# Patient Record
Sex: Female | Born: 1990 | Race: White | Hispanic: No | Marital: Single | Smoking: Never smoker
Health system: Southern US, Community
[De-identification: ages and names within clinical notes are randomized; demographics above are authoritative.]

## PROBLEM LIST (undated history)

## (undated) ENCOUNTER — Emergency Department (HOSPITAL_BASED_OUTPATIENT_CLINIC_OR_DEPARTMENT_OTHER): Admission: EM | Payer: BC Managed Care – PPO | Source: Home / Self Care

## (undated) DIAGNOSIS — K8689 Other specified diseases of pancreas: Secondary | ICD-10-CM

## (undated) DIAGNOSIS — G44009 Cluster headache syndrome, unspecified, not intractable: Secondary | ICD-10-CM

## (undated) DIAGNOSIS — K200 Eosinophilic esophagitis: Secondary | ICD-10-CM

## (undated) DIAGNOSIS — J45909 Unspecified asthma, uncomplicated: Secondary | ICD-10-CM

## (undated) DIAGNOSIS — G43909 Migraine, unspecified, not intractable, without status migrainosus: Secondary | ICD-10-CM

## (undated) DIAGNOSIS — K589 Irritable bowel syndrome without diarrhea: Secondary | ICD-10-CM

## (undated) DIAGNOSIS — R42 Dizziness and giddiness: Secondary | ICD-10-CM

## (undated) DIAGNOSIS — K2970 Gastritis, unspecified, without bleeding: Secondary | ICD-10-CM

## (undated) DIAGNOSIS — A64 Unspecified sexually transmitted disease: Secondary | ICD-10-CM

## (undated) DIAGNOSIS — F419 Anxiety disorder, unspecified: Secondary | ICD-10-CM

## (undated) DIAGNOSIS — K259 Gastric ulcer, unspecified as acute or chronic, without hemorrhage or perforation: Secondary | ICD-10-CM

## (undated) HISTORY — DX: Other specified diseases of pancreas: K86.89

## (undated) HISTORY — PX: INTRAUTERINE DEVICE (IUD) INSERTION: SHX5877

## (undated) HISTORY — DX: Migraine, unspecified, not intractable, without status migrainosus: G43.909

## (undated) HISTORY — DX: Anxiety disorder, unspecified: F41.9

## (undated) HISTORY — DX: Gastritis, unspecified, without bleeding: K29.70

## (undated) HISTORY — DX: Cluster headache syndrome, unspecified, not intractable: G44.009

## (undated) HISTORY — DX: Unspecified sexually transmitted disease: A64

## (undated) HISTORY — DX: Gastric ulcer, unspecified as acute or chronic, without hemorrhage or perforation: K25.9

---
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
## 2011-08-12 ENCOUNTER — Emergency Department (INDEPENDENT_AMBULATORY_CARE_PROVIDER_SITE_OTHER)

## 2011-08-12 ENCOUNTER — Emergency Department (HOSPITAL_BASED_OUTPATIENT_CLINIC_OR_DEPARTMENT_OTHER)
Admission: EM | Admit: 2011-08-12 | Discharge: 2011-08-12 | Disposition: A | Discharge status: Home / Self Care | Attending: Emergency Medicine | Admitting: Emergency Medicine

## 2011-08-12 ENCOUNTER — Encounter (HOSPITAL_BASED_OUTPATIENT_CLINIC_OR_DEPARTMENT_OTHER): Payer: Self-pay | Admitting: *Deleted

## 2011-08-12 DIAGNOSIS — R1032 Left lower quadrant pain: Secondary | ICD-10-CM | POA: Insufficient documentation

## 2011-08-12 DIAGNOSIS — O2390 Unspecified genitourinary tract infection in pregnancy, unspecified trimester: Secondary | ICD-10-CM | POA: Insufficient documentation

## 2011-08-12 DIAGNOSIS — Z331 Pregnant state, incidental: Secondary | ICD-10-CM

## 2011-08-12 DIAGNOSIS — N390 Urinary tract infection, site not specified: Secondary | ICD-10-CM | POA: Insufficient documentation

## 2011-08-12 DIAGNOSIS — R109 Unspecified abdominal pain: Secondary | ICD-10-CM

## 2011-08-12 DIAGNOSIS — Z3480 Encounter for supervision of other normal pregnancy, unspecified trimester: Secondary | ICD-10-CM

## 2011-08-12 LAB — COMPREHENSIVE METABOLIC PANEL
Alkaline Phosphatase: 54 U/L (ref 39–117)
BUN: 6 mg/dL (ref 6–23)
CO2: 24 mEq/L (ref 19–32)
Calcium: 9.3 mg/dL (ref 8.4–10.5)
Creatinine, Ser: 0.5 mg/dL (ref 0.50–1.10)
GFR calc Af Amer: >90 mL/min (ref >90)
Potassium: 4 mEq/L (ref 3.5–5.1)
Sodium: 139 mEq/L (ref 135–145)

## 2011-08-12 LAB — WET PREP, GENITAL: Yeast Wet Prep HPF POC: NONE SEEN

## 2011-08-12 LAB — URINALYSIS, ROUTINE W REFLEX MICROSCOPIC
Bilirubin Urine: NEGATIVE
Glucose, UA: NEGATIVE mg/dL
Hgb urine dipstick: NEGATIVE
Ketones, ur: NEGATIVE mg/dL
Specific Gravity, Urine: 1.014 (ref 1.005–1.030)

## 2011-08-12 LAB — DIFFERENTIAL
Basophils Absolute: 0 10*3/uL (ref 0.0–0.1)
Basophils Relative: 0 % (ref 0–1)
Eosinophils Absolute: 0.1 10*3/uL (ref 0.0–0.7)
Lymphocytes Relative: 17 % (ref 12–46)
Monocytes Absolute: 0.7 10*3/uL (ref 0.1–1.0)

## 2011-08-12 LAB — CBC
HCT: 36.7 % (ref 36.0–46.0)
Hemoglobin: 13.2 g/dL (ref 12.0–15.0)
MCH: 31.8 pg (ref 26.0–34.0)
MCHC: 36 g/dL (ref 30.0–36.0)
MCV: 88.4 fL (ref 78.0–100.0)
RBC: 4.15 MIL/uL (ref 3.87–5.11)
RDW: 12 % (ref 11.5–15.5)

## 2011-08-12 LAB — URINE MICROSCOPIC-ADD ON

## 2011-08-12 MED ORDER — HYDROCODONE-ACETAMINOPHEN 5-325 MG PO TABS: 1.0000 | ORAL_TABLET | Freq: Four times a day (QID) | ORAL | Status: AC | PRN

## 2011-08-12 MED ORDER — NITROFURANTOIN MONOHYD MACRO 100 MG PO CAPS: 100.0000 mg | ORAL_CAPSULE | Freq: Two times a day (BID) | ORAL | Status: AC

## 2011-08-12 NOTE — ED Notes (Signed)
14 weeks preg. Visiting from [place]. States when she went to bed last night she was having sharp lower abdominal pain. Woke with pain in her left lower quad and thick white vaginal discharge.

## 2011-08-12 NOTE — ED Provider Notes (Signed)
History     CSN: [id number]  Arrival date & time 08/12/11  1341   First MD Initiated Contact with Patient 08/12/11 1346     2:27 PM HPI Patient reports he is visiting from [place]. Last night developed a sudden sharp left lower abdominal pain. Reports pain radiated to the entire lower abdomen. Reports going to sleep and notes that pain would resolve. States that she continued to have left lower quadrant pain in the morning. States it is worse with ambulation. Better with laying flat. Associated with a thick white vaginal discharge. Denies urinary symptoms, vaginal bleeding, back pain, diarrhea, fevers, or a complicated pregnancy. Pt is [redacted] weeks pregnant and has had normal ultrasounds in [place] Patient is a 21 y.o. female presenting with abdominal pain. The history is provided by the patient.  Abdominal Pain The primary symptoms of the illness include abdominal pain and vaginal discharge. The primary symptoms of the illness do not include fever, shortness of breath, nausea, vomiting, diarrhea, dysuria or vaginal bleeding. The current episode started yesterday. The onset of the illness was gradual. The problem has been gradually worsening.  The abdominal pain began yesterday. The pain came on suddenly. The abdominal pain has been gradually worsening since its onset. The abdominal pain is located in the LLQ. The abdominal pain does not radiate. The abdominal pain is relieved by nothing. Exacerbated by: Palpation and walking.  The vaginal discharge was first noticed yesterday. Vaginal discharge is a new problem. The amount of discharge is moderate. The color of the discharge is white. The vaginal discharge is not associated with itching, burning or dysuria.  The patient states that she believes she is currently pregnant. The patient has not had a change in bowel habit. Symptoms associated with the illness do not include chills, constipation, urgency, hematuria, frequency or back pain. Associated medical  issues comments: [redacted] weeks pregnant.    History reviewed. No pertinent past medical history.  History reviewed. No pertinent past surgical history.  No family history on file.  History  Substance Use Topics  . Smoking status: Never Smoker   . Smokeless tobacco: Not on file  . Alcohol Use: No    OB History    Grav Para Term Preterm Abortions TAB SAB Ect Mult Living                  Review of Systems  Constitutional: Negative for fever and chills.  Respiratory: Negative for shortness of breath.   Cardiovascular: Negative for chest pain.  Gastrointestinal: Positive for abdominal pain. Negative for nausea, vomiting, diarrhea and constipation.  Genitourinary: Positive for vaginal discharge. Negative for dysuria, urgency, frequency, hematuria, flank pain, vaginal bleeding, vaginal pain and pelvic pain.  Musculoskeletal: Negative for back pain.  Skin: Negative for itching.  All other systems reviewed and are negative.    Allergies  Review of patient's allergies indicates no known allergies.  Home Medications   Current Outpatient Rx  Name Route Sig Dispense Refill  . ZOFRAN PO Oral Take by mouth.    Marland Kitchen PRENATAL VITAMINS PO Oral Take by mouth.      BP 117/76  Pulse 106  Temp(Src) 97.4 F (36.3 C) (Oral)  Resp 20  SpO2 100%  LMP 05/09/2011  Physical Exam  Vitals reviewed. Constitutional: She is oriented to person, place, and time. Vital signs are normal. She appears well-developed and well-nourished.  HENT:  Head: Normocephalic and atraumatic.  Eyes: Conjunctivae are normal. Pupils are equal, round, and reactive to light.  Neck: Normal range of motion. Neck supple.  Cardiovascular: Normal rate, regular rhythm and normal heart sounds.  Exam reveals no friction rub.   No murmur heard. Pulmonary/Chest: Effort normal and breath sounds normal. She has no wheezes. She has no rhonchi. She has no rales. She exhibits no tenderness.  Abdominal: Normal appearance and bowel  sounds are normal. She exhibits no distension. There is no hepatosplenomegaly. There is tenderness in the left lower quadrant. There is no rigidity, no rebound, no guarding, no CVA tenderness, no tenderness at McBurney's point and negative Murphy's sign.    Genitourinary: There is no tenderness or lesion on the right labia. There is no tenderness or lesion on the left labia. Uterus is enlarged (Gravid. Consistent with dates ). Uterus is not tender. Cervix exhibits no motion tenderness. Right adnexum displays no mass, no tenderness and no fullness. Left adnexum displays no mass, no tenderness and no fullness. No tenderness or bleeding around the vagina. No signs of injury around the vagina. Vaginal discharge (white ) found.  Musculoskeletal: Normal range of motion.  Neurological: She is alert and oriented to person, place, and time. Coordination normal.  Skin: Skin is warm and dry. No rash noted. No erythema. No pallor.    ED Course  Procedures  Results for orders placed during the hospital encounter of 08/12/11  URINALYSIS, ROUTINE W REFLEX MICROSCOPIC      Component Value Range   Color, Urine YELLOW  YELLOW    APPearance CLOUDY (*) CLEAR    Specific Gravity, Urine 1.014  1.005 - 1.030    pH 6.5  5.0 - 8.0    Glucose, UA NEGATIVE  NEGATIVE (mg/dL)   Hgb urine dipstick NEGATIVE  NEGATIVE    Bilirubin Urine NEGATIVE  NEGATIVE    Ketones, ur NEGATIVE  NEGATIVE (mg/dL)   Protein, ur NEGATIVE  NEGATIVE (mg/dL)   Urobilinogen, UA 0.2  0.0 - 1.0 (mg/dL)   Nitrite NEGATIVE  NEGATIVE    Leukocytes, UA MODERATE (*) NEGATIVE   WET PREP, GENITAL      Component Value Range   Yeast, Wet Prep NONE SEEN  NONE SEEN    Trich, Wet Prep NONE SEEN  NONE SEEN    Clue Cells, Wet Prep MODERATE (*) NONE SEEN    WBC, Wet Prep HPF POC MANY (*) NONE SEEN   CBC      Component Value Range   WBC 9.5  4.0 - 10.5 (K/uL)   RBC 4.15  3.87 - 5.11 (MIL/uL)   Hemoglobin 13.2  12.0 - 15.0 (g/dL)   HCT 86.5  78.4 -  69.6 (%)   MCV 88.4  78.0 - 100.0 (fL)   MCH 31.8  26.0 - 34.0 (pg)   MCHC 36.0  30.0 - 36.0 (g/dL)   RDW 29.5  28.4 - 13.2 (%)   Platelets 202  150 - 400 (K/uL)  DIFFERENTIAL      Component Value Range   Neutrophils Relative 74  43 - 77 (%)   Neutro Abs 7.0  1.7 - 7.7 (K/uL)   Lymphocytes Relative 17  12 - 46 (%)   Lymphs Abs 1.6  0.7 - 4.0 (K/uL)   Monocytes Relative 8  3 - 12 (%)   Monocytes Absolute 0.7  0.1 - 1.0 (K/uL)   Eosinophils Relative 1  0 - 5 (%)   Eosinophils Absolute 0.1  0.0 - 0.7 (K/uL)   Basophils Relative 0  0 - 1 (%)   Basophils Absolute 0.0  0.0 -  0.1 (K/uL)  COMPREHENSIVE METABOLIC PANEL      Component Value Range   Sodium 139  135 - 145 (mEq/L)   Potassium 4.0  3.5 - 5.1 (mEq/L)   Chloride 105  96 - 112 (mEq/L)   CO2 24  19 - 32 (mEq/L)   Glucose, Bld 87  70 - 99 (mg/dL)   BUN 6  6 - 23 (mg/dL)   Creatinine, Ser 1.61  0.50 - 1.10 (mg/dL)   Calcium 9.3  8.4 - 09.6 (mg/dL)   Total Protein 6.8  6.0 - 8.3 (g/dL)   Albumin 3.7  3.5 - 5.2 (g/dL)   AST 14  0 - 37 (U/L)   ALT 10  0 - 35 (U/L)   Alkaline Phosphatase 54  39 - 117 (U/L)   Total Bilirubin 0.2 (*) 0.3 - 1.2 (mg/dL)   GFR calc non Af Amer >90  >90 (mL/min)   GFR calc Af Amer >90  >90 (mL/min)  LIPASE, BLOOD      Component Value Range   Lipase 28  11 - 59 (U/L)  URINE MICROSCOPIC-ADD ON      Component Value Range   Squamous Epithelial / LPF FEW (*) RARE    WBC, UA 11-20  <3 (WBC/hpf)   RBC / HPF 3-6  <3 (RBC/hpf)   Bacteria, UA MANY (*) RARE    US Ob Comp Less 14 Wks  08/12/2011  *RADIOLOGY REPORT*  Clinical Data: Left lower quadrant pain for 1 day.  No vaginal bleeding  OBSTETRIC <14 WK [place] AND TRANSVAGINAL OB US  Technique:  Both transabdominal and transvaginal ultrasound examinations were performed for complete evaluation of the gestation as well as the maternal uterus, adnexal regions, and pelvic cul-de-sac.  Transvaginal technique was performed to assess early pregnancy.  Comparison:  None.   Intrauterine gestational sac:  Seen Yolk sac: Not seen Embryo: Seen Cardiac Activity: Seen Heart Rate: 141 bpm  MSD:   mm      w     d CRL: 75.2   mm  13   w  4   d             [place] EDC: 02/13/2012  Maternal uterus/adnexae: The left ovary has a normal appearance measuring 2.7 x 3.2 x 2.1 cm.  The right ovary is not seen with confidence.  IMPRESSION: Single living intrauterine pregnancy demonstrating an estimated gestational age by crown-rump length of 13 weeks 4 days.  This correlates well with expected estimated gestational age by LMP of 13 weeks 4 days and corresponding EDC of 02/13/2012.  Normal left ovary and non-visualized right ovary.  Original Report Authenticated By: Bertha Stakes, M.D.   US Ob Transvaginal  08/12/2011  *RADIOLOGY REPORT*  Clinical Data: Left lower quadrant pain for 1 day.  No vaginal bleeding  OBSTETRIC <14 WK [place] AND TRANSVAGINAL OB US  Technique:  Both transabdominal and transvaginal ultrasound examinations were performed for complete evaluation of the gestation as well as the maternal uterus, adnexal regions, and pelvic cul-de-sac.  Transvaginal technique was performed to assess early pregnancy.  Comparison:  None.  Intrauterine gestational sac:  Seen Yolk sac: Not seen Embryo: Seen Cardiac Activity: Seen Heart Rate: 141 bpm  MSD:   mm      w     d CRL: 75.2   mm  13   w  4   d             [place] EDC: 02/13/2012  Maternal  uterus/adnexae: The left ovary has a normal appearance measuring 2.7 x 3.2 x 2.1 cm.  The right ovary is not seen with confidence.  IMPRESSION: Single living intrauterine pregnancy demonstrating an estimated gestational age by crown-rump length of 13 weeks 4 days.  This correlates well with expected estimated gestational age by LMP of 13 weeks 4 days and corresponding EDC of 02/13/2012.  Normal left ovary and non-visualized right ovary.  Original Report Authenticated By: Bertha Stakes, M.D.     MDM  3:55 PM  Patient reports she has not even tried Tylenol.  Will treat for urinary tract infection and abdominal has returned to this emergency room for worsening symptoms. Patient and family agree on Saturday for discharge       Thomasene Lot, Cordelia Poche 08/12/11 1558

## 2011-08-13 LAB — GC/CHLAMYDIA PROBE AMP, GENITAL: GC Probe Amp, Genital: NEGATIVE

## 2011-08-13 NOTE — ED Provider Notes (Signed)
Medical screening examination/treatment/procedure(s) were performed by non-physician practitioner and as supervising physician I was immediately available for consultation/collaboration.  Gerhard Munch, MD 08/13/11 [phone]

## 2015-01-30 ENCOUNTER — Encounter: Payer: Self-pay | Admitting: Obstetrics and Gynecology

## 2015-01-30 ENCOUNTER — Ambulatory Visit (INDEPENDENT_AMBULATORY_CARE_PROVIDER_SITE_OTHER): Payer: BLUE CROSS/BLUE SHIELD | Admitting: Obstetrics and Gynecology

## 2015-01-30 VITALS — BP 96/68 | HR 72 | Resp 16 | Ht 62.25 in | Wt 161.0 lb

## 2015-01-30 DIAGNOSIS — R194 Change in bowel habit: Secondary | ICD-10-CM

## 2015-01-30 DIAGNOSIS — N914 Secondary oligomenorrhea: Secondary | ICD-10-CM | POA: Diagnosis not present

## 2015-01-30 DIAGNOSIS — R1010 Upper abdominal pain, unspecified: Secondary | ICD-10-CM

## 2015-01-30 LAB — POCT URINE PREGNANCY: PREG TEST UR: NEGATIVE

## 2015-01-30 NOTE — Progress Notes (Signed)
GYNECOLOGY VISIT  PCP: Laureen OchsMegan Hunter  Referring provider:   HPI: 24 y.o.   Single  Caucasian  female   G1P1001 with Patient's last menstrual period was 11/25/2014.   here for  Abdominal Pain. The pain started 5 days ago in the RLQ, now in [place]. She c/o a constant dull ache and then intermittent sharp pains. The sharp pains last for 2-10 minutes. Pain ranges from a 4-9/10 in severity.  She had constipation 2 days ago, painful to pass gas. Since then watery to soft stools. Soft this morning. BM 1-2 x a day over the last 2 days. She reports a negative CT scan 2 days ago. She had nausea initially, that is better, now with anorexia. No urinary c/o. No fevers. LMP 11/25/14. She was on depo provera, stopped last year and went right on the nuvaring. She stopped the nuvaring about a month ago, no bleeding since then. She has pills to start with her next cycle. She has been sexually active, using condoms. Same partner x 13 months. No h/o STD's. Blood work with her primary yesterday, not sure what was done. The patient states her primary ruled out appendicitis.  Son is 3.  Hgb:  PCP Urine:  PCP  GYNECOLOGIC HISTORY: Patient's last menstrual period was 11/25/2014. Sexually active: Yes  Partner preference: Female Contraception: condoms    Menopausal hormone therapy: None DES exposure: None   Blood transfusions:   Nose Sexually transmitted diseases: No   GYN procedures and prior surgeries:  C-Section Last mammogram: Never                 Last pap and high risk HPV testing: 07/2014? Normal - per pt History of abnormal pap smear:  No   OB History    Gravida Para Term Preterm AB TAB SAB Ectopic Multiple Living   1 1 1       1        Past Medical History  Diagnosis Date  . Anxiety     Past Surgical History  Procedure Laterality Date  . Cesarean section    . Tonsillectomy  2001    Current Outpatient Prescriptions  Medication Sig Dispense Refill  . pseudoephedrine (SUDAFED) 120 MG 12 hr tablet  Take 120 mg by mouth daily as needed for congestion.     No current facility-administered medications for this visit.     ALLERGIES: Nickel  Family History  Problem Relation Age of Onset  . Hypertension Mother   . Heart attack Father   . Hypertension Father   . Alzheimer's disease Maternal Grandmother   . Irregular heart beat Maternal Grandmother   . Stroke Maternal Grandfather   . Gallbladder disease Maternal Grandfather   . Cancer Paternal Grandfather     Colon    History   Social History  . Marital Status: Single    Spouse Name: N/A  . Number of Children: N/A  . Years of Education: N/A   Occupational History  . Not on file.   Social History Main Topics  . Smoking status: Never Smoker   . Smokeless tobacco: Never Used  . Alcohol Use: No  . Drug Use: No  . Sexual Activity: Yes    Birth Control/ Protection: Condom   Other Topics Concern  . Not on file   Social History Narrative    ROS:  Pertinent items are noted in HPI.  PHYSICAL EXAMINATION:    BP 96/68 mmHg  Pulse 72  Resp 16  Ht 5' 2.25" (  1.581 m)  Wt 161 lb (73.029 kg)  BMI 29.22 kg/m2  LMP 11/25/2014   Wt Readings from Last 3 Encounters:  01/30/15 161 lb (73.029 kg)     Ht Readings from Last 3 Encounters:  01/30/15 5' 2.25" (1.581 m)    General appearance: alert, cooperative and appears stated age Neck: supple, no thyromegaly Abdomen: soft, tender in her mid abdomen and in the epigastric region. Most tender in the epigastric region. Not tender in her lower abdomen, hypoactive bowel sounds, no masses,  no organomegaly Lymph nodes: Cervical, supraclavicular No abnormal inguinal nodes palpated  Pelvic: External genitalia:  no lesions              Urethra:  normal appearing urethra with no masses, tenderness or lesions              Bartholins and Skenes: normal                 Vagina: normal appearing vagina with normal color and discharge, no lesions              Cervix: normal appearance                  Bimanual Exam:  Uterus:  uterus is normal size, shape, consistency and nontender                                      Adnexa: normal adnexa in size, nontender and no masses                                      Rectovaginal: Confirms                                      Anus:  normal sphincter tone, no lesions  ASSESSMENT Abdominal pain x 5 days with associated bowel changes, anorexia. No pelvic pain or tenderness on exam No cycle since stopping the nuvaring 1 month ago (previously on depo provera), just late for her cycle   PLAN UPT negative Have requested the patients records, need to review blood work and CT Will confirm she has had a liver panel, amylase, lipase, CBC. We have drawn blood to send if needed Will need to f/u with her primary or GI If the patient doesn't have a cycle in 2 weeks will order a TSH, prolactin, FSH and estradiol Tylenol or advil as needed     An After Visit Summary was printed and given to the patient.    Addendum: records reviewed: Negative genprobe, normal CBC, CMP, negative urine culture, CT abdomen and pelvic normal other than a small fat containing umbilical hernia. She did have blood in her urine, no flank pain or vaginal bleeding.  Check amylase and lipase F/U with primary MD

## 2015-01-31 ENCOUNTER — Telehealth: Payer: Self-pay | Admitting: *Deleted

## 2015-01-31 LAB — AMYLASE: AMYLASE: 17 U/L (ref 0–105)

## 2015-01-31 LAB — LIPASE: LIPASE: 20 U/L (ref 0–75)

## 2015-01-31 NOTE — Telephone Encounter (Signed)
Patient Demographics     Patient Name Sex DOB SSN Address Phone    Stefanie Lucero, Stefanie Lucero Female 06/20/1991 [id number] [address] [place] [id number] [phone] [facility](Home) [phone] (Mobile)      Message  Received: Burnadette PopYesterday    [name], MD  [name], CMA            Please inform the patient that we have sent blood work for her epigastric pain. The rest of the blood work was done by her primary and was normal.  On CT she had a small fat containing umbilical hernia, "no acute intra-abdominal or pelvic pathology". A hernia with bowel could definitely cause pain, bowel changes. Seems unlikely this is. She should f/u with her primary or GI.  She also had blood in her urine, should be rechecked with her primary. Likely a contaminant.       - Called patient. She was notified and verbalized understanding.   Routed to Provider for final review. - Encounter closed.

## 2015-02-27 ENCOUNTER — Telehealth: Payer: Self-pay | Admitting: *Deleted

## 2015-02-27 NOTE — Telephone Encounter (Signed)
LMTC -eh  

## 2015-02-27 NOTE — Telephone Encounter (Signed)
-----   Message from [name], MD sent at 02/27/2015 10:31 AM EDT ----- Can you please call and check on this patient that we saw last month with pain. Thanks, Jill 

## 2015-03-06 ENCOUNTER — Telehealth: Payer: Self-pay | Admitting: *Deleted

## 2015-03-06 NOTE — Telephone Encounter (Signed)
-----   Message from Romualdo Bolk, MD sent at 02/27/2015 10:31 AM EDT ----- Can you please call and check on this patient that we saw last month with pain. Thanks, Noreene Larsson

## 2015-03-06 NOTE — Telephone Encounter (Signed)
I tried to contact patient twice however her VM has not been set up yet so I can not leave a message.

## 2016-07-04 ENCOUNTER — Emergency Department (HOSPITAL_BASED_OUTPATIENT_CLINIC_OR_DEPARTMENT_OTHER)
Admission: EM | Admit: 2016-07-04 | Discharge: 2016-07-04 | Disposition: A | Discharge status: Home / Self Care | Payer: Managed Care, Other (non HMO) | Attending: Emergency Medicine | Admitting: Emergency Medicine

## 2016-07-04 ENCOUNTER — Encounter (HOSPITAL_BASED_OUTPATIENT_CLINIC_OR_DEPARTMENT_OTHER): Payer: Self-pay | Admitting: Emergency Medicine

## 2016-07-04 ENCOUNTER — Emergency Department (HOSPITAL_BASED_OUTPATIENT_CLINIC_OR_DEPARTMENT_OTHER): Payer: Managed Care, Other (non HMO)

## 2016-07-04 DIAGNOSIS — M791 Myalgia: Secondary | ICD-10-CM | POA: Diagnosis not present

## 2016-07-04 DIAGNOSIS — R51 Headache: Secondary | ICD-10-CM | POA: Diagnosis present

## 2016-07-04 DIAGNOSIS — R197 Diarrhea, unspecified: Secondary | ICD-10-CM | POA: Diagnosis not present

## 2016-07-04 DIAGNOSIS — R42 Dizziness and giddiness: Secondary | ICD-10-CM | POA: Insufficient documentation

## 2016-07-04 DIAGNOSIS — R519 Headache, unspecified: Secondary | ICD-10-CM

## 2016-07-04 MED ORDER — NAPROXEN 500 MG PO TABS: 500.0000 mg | ORAL_TABLET | Freq: Two times a day (BID) | ORAL | 0 refills | Status: DC

## 2016-07-04 NOTE — ED Provider Notes (Signed)
[facility] Provider Note   CSN: [id number] Arrival date & time: 07/04/16  1458 By signing my name below, I, Bridgette HabermannMaria Tan, attest that this documentation has been prepared under the direction and in the presence of Linwood DibblesJon Tilford Deaton, MD. Electronically Signed: Bridgette HabermannMaria Tan, ED Scribe. 07/04/16. 4:15 PM.  History   Chief Complaint Chief Complaint  Patient presents with  . Headache   HPI Comments: Stefanie Lucero is a 25 y.o. female with h/o anxiety and bipolar disorder who presents to the Emergency Department complaining of intermittent headache onset one month ago, worsening last night. Pt reports that she has had persistent headaches since she had her upper back molar extracted a month ago. Pt describes her current headache that began last night as stabbing pain in her posterior head radiating to her neck with associated bilateral arm pain, dry heaving, diarrhea and dizziness. Pt has taken Ibuprofen 800mg  this morning with minimal relief. Denies h/o migraines. Pt further denies fever, chills, or any other associated symptoms.   The history is provided by the patient. No language interpreter was used.    Past Medical History:  Diagnosis Date  . Anxiety     There are no active problems to display for this patient.   Past Surgical History:  Procedure Laterality Date  . CESAREAN SECTION    . TONSILLECTOMY  2001    OB History    Gravida Para Term Preterm AB Living   1 1 1     1    SAB TAB Ectopic Multiple Live Births           1       Home Medications    Prior to Admission medications   Medication Sig Start Date End Date Taking? Authorizing Provider  naproxen (NAPROSYN) 500 MG tablet Take 1 tablet (500 mg total) by mouth 2 (two) times daily. 07/04/16   Linwood DibblesJon Joslynn Jamroz, MD  pseudoephedrine (SUDAFED) 120 MG 12 hr tablet Take 120 mg by mouth daily as needed for congestion.    Historical Provider, MD    Family History Family History  Problem Relation Age of Onset  . Hypertension  Mother   . Heart attack Father   . Hypertension Father   . Alzheimer's disease Maternal Grandmother   . Irregular heart beat Maternal Grandmother   . Stroke Maternal Grandfather   . Gallbladder disease Maternal Grandfather   . Cancer Paternal Grandfather     Colon    Social History Social History  Substance Use Topics  . Smoking status: Never Smoker  . Smokeless tobacco: Never Used  . Alcohol use No     Allergies   Iodine and Nickel   Review of Systems Review of Systems  Constitutional: Negative for chills and fever.  Gastrointestinal: Positive for diarrhea.  Musculoskeletal: Positive for myalgias and neck pain.  Neurological: Positive for dizziness and headaches.  All other systems reviewed and are negative.    Physical Exam Updated Vital Signs BP 109/79 (BP Location: Right Arm)   Pulse 77   Temp 98 F (36.7 C) (Oral)   Resp 24   Ht 5\' 3"  (1.6 m)   Wt 77.1 kg   LMP 06/28/2016   SpO2 99%   BMI 30.11 kg/m   Physical Exam  Constitutional: She appears well-developed and well-nourished. No distress.  HENT:  Head: Normocephalic and atraumatic.  Right Ear: External ear normal.  Left Ear: External ear normal.  Eyes: Conjunctivae are normal. Right eye exhibits no discharge. Left eye exhibits no  discharge. No scleral icterus.  Neck: Neck supple. No tracheal deviation present.  Cardiovascular: Normal rate, regular rhythm and intact distal pulses.   Pulmonary/Chest: Effort normal and breath sounds normal. No stridor. No respiratory distress. She has no wheezes. She has no rales.  Abdominal: Soft. Bowel sounds are normal. She exhibits no distension. There is no tenderness. There is no rebound and no guarding.  Musculoskeletal: She exhibits no edema or tenderness.  Neurological: She is alert. She has normal strength. No cranial nerve deficit (no facial droop, extraocular movements intact, no slurred speech) or sensory deficit. She exhibits normal muscle tone. She  displays no seizure activity. Coordination normal.  Skin: Skin is warm and dry. No rash noted.  Psychiatric: She has a normal mood and affect.  Nursing note and vitals reviewed.    ED Treatments / Results  DIAGNOSTIC STUDIES: Oxygen Saturation is 100% on RA, normal by my interpretation.    COORDINATION OF CARE: 4:14 PM Discussed treatment plan with pt at bedside which includes head CT and pt agreed to plan.  Labs (all labs ordered are listed, but only abnormal results are displayed) Labs Reviewed - No data to display  EKG  EKG Interpretation None       Radiology Ct Head Wo Contrast  Result Date: 07/04/2016 CLINICAL DATA:  Headache EXAM: CT HEAD WITHOUT CONTRAST TECHNIQUE: Contiguous axial images were obtained from the base of the skull through the vertex without intravenous contrast. COMPARISON:  Dec 10, 2009 FINDINGS: Brain: No evidence of acute infarction, hemorrhage, hydrocephalus, extra-axial collection or mass lesion/mass effect. Vascular: No hyperdense vessel or unexpected calcification. Skull: Normal. Negative for fracture or focal lesion. Sinuses/Orbits: No acute finding. Other: None. IMPRESSION: Normal.  No acute abnormalities. Electronically Signed   By: Gerome Samavid  Williams III M.D   On: 07/04/2016 16:44    Procedures Procedures (including critical care time)  Medications Ordered in ED Medications - No data to display   Initial Impression / Assessment and Plan / ED Course  I have reviewed the triage vital signs and the nursing notes.  Pertinent labs & imaging results that were available during my care of the patient were reviewed by me and considered in my medical decision making (see chart for details).  Clinical Course   Possible tension type or migraine headache.    Exam is reassuring.  NO mass noted on CT scan.  No thunder clap onset or severe headache to suggest SAH.  Neck is benign.  No sx or findings to suggest meningitis. Will treat with nsaids.  Follow up  with PCP  Final Clinical Impressions(s) / ED Diagnoses   Final diagnoses:  Nonintractable headache, unspecified chronicity pattern, unspecified headache type    New Prescriptions New Prescriptions   NAPROXEN (NAPROSYN) 500 MG TABLET    Take 1 tablet (500 mg total) by mouth 2 (two) times daily.   I personally performed the services described in this documentation, which was scribed in my presence.  The recorded information has been reviewed and is accurate.     Linwood DibblesJon Merelin Human, MD 07/04/16 [phone]

## 2016-07-04 NOTE — Discharge Instructions (Signed)
Take the medications as prescribed, follow up with your primary care doctor °

## 2016-07-04 NOTE — ED Notes (Signed)
Patient states she has a intermittent headache for one month.  States last night she felt like she was having stabbing pains in the back of her head, and bilateral arm pain.  States she had a upper back molar extracted one month ago and that is when her headaches started.

## 2016-07-04 NOTE — ED Triage Notes (Signed)
Intermittent headaches x 1 month, worse last night. Also reports diarrhea and bilat arm pain.

## 2016-07-05 DIAGNOSIS — G43011 Migraine without aura, intractable, with status migrainosus: Secondary | ICD-10-CM | POA: Insufficient documentation

## 2017-03-25 ENCOUNTER — Encounter: Payer: Self-pay | Admitting: Certified Nurse Midwife

## 2017-03-25 ENCOUNTER — Other Ambulatory Visit (HOSPITAL_COMMUNITY)
Admission: RE | Admit: 2017-03-25 | Discharge: 2017-03-25 | Disposition: A | Discharge status: Home / Self Care | Payer: Managed Care, Other (non HMO) | Source: Ambulatory Visit | Attending: Certified Nurse Midwife | Admitting: Certified Nurse Midwife

## 2017-03-25 ENCOUNTER — Ambulatory Visit (INDEPENDENT_AMBULATORY_CARE_PROVIDER_SITE_OTHER): Payer: 59 | Admitting: Certified Nurse Midwife

## 2017-03-25 VITALS — BP 120/82 | HR 92 | Resp 16 | Ht 62.25 in | Wt 179.0 lb

## 2017-03-25 DIAGNOSIS — R319 Hematuria, unspecified: Secondary | ICD-10-CM

## 2017-03-25 DIAGNOSIS — N390 Urinary tract infection, site not specified: Secondary | ICD-10-CM | POA: Diagnosis not present

## 2017-03-25 DIAGNOSIS — B373 Candidiasis of vulva and vagina: Secondary | ICD-10-CM | POA: Diagnosis not present

## 2017-03-25 DIAGNOSIS — Z113 Encounter for screening for infections with a predominantly sexual mode of transmission: Secondary | ICD-10-CM | POA: Insufficient documentation

## 2017-03-25 DIAGNOSIS — A5909 Other urogenital trichomoniasis: Secondary | ICD-10-CM | POA: Insufficient documentation

## 2017-03-25 DIAGNOSIS — N926 Irregular menstruation, unspecified: Secondary | ICD-10-CM

## 2017-03-25 LAB — POCT URINALYSIS DIPSTICK
BILIRUBIN UA: NEGATIVE
Glucose, UA: NEGATIVE
Ketones, UA: NEGATIVE
LEUKOCYTES UA: NEGATIVE
NITRITE UA: NEGATIVE
PH UA: 5 (ref 5.0–8.0)
PROTEIN UA: NEGATIVE
Urobilinogen, UA: NEGATIVE E.U./dL — AB

## 2017-03-25 LAB — POCT URINE PREGNANCY: Preg Test, Ur: NEGATIVE

## 2017-03-25 MED ORDER — NITROFURANTOIN MONOHYD MACRO 100 MG PO CAPS: ORAL_CAPSULE | ORAL | 0 refills | Status: DC

## 2017-03-25 NOTE — Patient Instructions (Signed)

## 2017-03-25 NOTE — Progress Notes (Signed)
26 y.o. Single Caucasian female G1P1001 here with complaint of vaginal  symptoms of , burning when urine touches skin, pain with urination and some frequency and increase discharge in the past week. Denies back pain, fever or chills or headache.  Describes discharge as white.. Onset of symptoms 3-4 days ago. Denies new personal products. Has STD concerns and was treated empirically with PCP for Gc/chlamydia 2-3 months ago, with negative urine screen.Marland Kitchen. Desires STD screening due to partner trust issues.  . Contraception was Nuvaring which helped with cycle, but has not used in 2 years and would like to restart. Using condoms occasional. Has not had aex in 2 years. Has 25 yo who she is busy with. No other health issues today.  ROS Pertinent  to above  O:Healthy female WDWN Affect: normal, orientation x 3  Exam:Skin warm and dry. Abdomen: positive suprapubic, soft, no masses  Inguinal Lymph node: no enlargement or tenderness CVAT: negative bilateral Pelvic exam: External genital: normal female, no lesions, scaling or exudate BUS: negative Bladder tender, Urethral meatus tender, red Vagina: white, non odorous discharge noted. Specimens taken Cervix: normal, non tender, no CMT Uterus: normal, non tender Adnexa:normal, non tender, no masses or fullness noted   Wet Prep results: UPT-neg poct urine- RBC trace  A:Normal pelvic exam Suspect UTI per symptoms STD screening ? Exposure 2 months ago with treatment Contraception needed/ aex needed Contraception condoms inconsistent   P:Discussed findings of UTI and etiology. Warning signs given for UTI and need to increase water intake and decrease soda intake.Marland Kitchen. Rx: Macrobid see order with instructions Lab: urine micro and culture Discussed STD prevention with consistent condom use. Labs: GC,Chlamydia, Trichomonas, BV, Yeast, HIV,RPR, Hep. C,B Discussed will need aex to start on contraception again, and would need to start with next period.  Patient will schedule aex in next 2 weeks. Questions addressed.  Rv prn, aex

## 2017-03-26 LAB — HEP, RPR, HIV PANEL
HIV SCREEN 4TH GENERATION: NONREACTIVE
Hepatitis B Surface Ag: NEGATIVE
RPR: NONREACTIVE

## 2017-03-26 LAB — URINALYSIS, MICROSCOPIC ONLY
Bacteria, UA: NONE SEEN
CASTS: NONE SEEN /LPF

## 2017-03-26 LAB — HEPATITIS C ANTIBODY: Hep C Virus Ab: <0.1 s/co ratio (ref 0.0–0.9)

## 2017-03-27 LAB — URINE CULTURE

## 2017-03-31 ENCOUNTER — Telehealth: Payer: Self-pay

## 2017-03-31 LAB — CERVICOVAGINAL ANCILLARY ONLY
Bacterial vaginitis: NEGATIVE
Candida vaginitis: POSITIVE — AB
Chlamydia: NEGATIVE
NEISSERIA GONORRHEA: NEGATIVE
TRICH (WINDOWPATH): POSITIVE — AB

## 2017-03-31 MED ORDER — METRONIDAZOLE 500 MG PO TABS: 500.0000 mg | ORAL_TABLET | Freq: Two times a day (BID) | ORAL | 0 refills | Status: DC

## 2017-03-31 MED ORDER — TERCONAZOLE 0.4 % VA CREA: 1.0000 | TOPICAL_CREAM | Freq: Every day | VAGINAL | 0 refills | Status: DC

## 2017-03-31 NOTE — Telephone Encounter (Signed)
-----   Message from Verner Choleborah S Leonard, CNM sent at 03/31/2017 12:09 PM EDT ----- Notify patient that vaginal screen was positive for yeast and trichomonas. Patient will need Rx Terazol vaginal cream every hs x 7 for yeast Trichomonas is a vaginal STD she will need Rx Flagyl 500 mg bid x 7 days no alcohol during treatment. Partner will need treatment. Will need TOC at 1 month please schedule BV, GC, Chlamydia are negative

## 2017-03-31 NOTE — Telephone Encounter (Signed)
Spoke with patient and notified of positive yeast and trichomonas on Affirm. Will send in Rxs for Flagyl 500mg  #14,1po bid,NR and Terazol-7 to use 1 applicatorful pv nightly for 7 nights, NR. Patient to notify partner he needs to be evaluated and treated for trichomonas. Advised BV, GC/CT all negative. Appointment for TOC made 05-03-17 at 4:00pm. Patient has AEX 04-08-17 10:00am.

## 2017-03-31 NOTE — Telephone Encounter (Signed)
Called patient at [phone] to discuss results of Affirm, and GC/CT--unable to leave message because "voicemail not set up".

## 2017-04-08 ENCOUNTER — Ambulatory Visit: Payer: 59 | Admitting: Certified Nurse Midwife

## 2017-04-12 ENCOUNTER — Ambulatory Visit: Payer: 59 | Admitting: Certified Nurse Midwife

## 2017-04-29 ENCOUNTER — Encounter: Payer: Self-pay | Admitting: Certified Nurse Midwife

## 2017-04-29 ENCOUNTER — Ambulatory Visit (INDEPENDENT_AMBULATORY_CARE_PROVIDER_SITE_OTHER): Payer: 59 | Admitting: Certified Nurse Midwife

## 2017-04-29 VITALS — BP 104/68 | HR 64 | Resp 16 | Ht 62.25 in | Wt 178.0 lb

## 2017-04-29 DIAGNOSIS — A599 Trichomoniasis, unspecified: Secondary | ICD-10-CM | POA: Diagnosis not present

## 2017-04-29 NOTE — Progress Notes (Signed)
26 y.o. Single Caucasian female G1P1001 here for follow up of Trichomonas treated with Flagyl initiated on 03/31/17. Completed all medication as directed.  Denies any symptoms of vaginal discharge or odor., Urinary frequency has stopped. All the vaginal sensitivity has stopped. Partner treated by his PCP. No other concerns today.    O: Healthy WD,WN female Affect: normal Skin:warm and dry Abdomen:soft, non tender,  Pelvic exam:EXTERNAL GENITALIA: normal appearing vulva with no masses, tenderness or lesions VAGINA: no abnormal discharge or lesions and affirm collected CERVIX: no lesions or cervical motion tenderness and normal appearance UTERUS: anteverted and non tender ADNEXA: no masses palpable and nontender  A:Trichomonas probably resolved Resolved  Normal pelvic exam    P: Discussed findings of normal pelvic exam and importance of protection for STD with condom use. Questions addressed. Labs: Affirm    RV prn

## 2017-04-30 LAB — VAGINITIS/VAGINOSIS, DNA PROBE
Candida Species: POSITIVE — AB
GARDNERELLA VAGINALIS: NEGATIVE
Trichomonas vaginosis: NEGATIVE

## 2017-05-03 ENCOUNTER — Ambulatory Visit: Payer: 59 | Admitting: Certified Nurse Midwife

## 2017-05-03 ENCOUNTER — Other Ambulatory Visit: Payer: Self-pay

## 2017-05-03 MED ORDER — TERCONAZOLE 0.4 % VA CREA: TOPICAL_CREAM | VAGINAL | 0 refills | Status: DC

## 2018-01-23 DIAGNOSIS — A599 Trichomoniasis, unspecified: Secondary | ICD-10-CM | POA: Insufficient documentation

## 2018-08-01 ENCOUNTER — Ambulatory Visit (INDEPENDENT_AMBULATORY_CARE_PROVIDER_SITE_OTHER): Payer: 59 | Admitting: Certified Nurse Midwife

## 2018-08-01 ENCOUNTER — Other Ambulatory Visit: Payer: Self-pay

## 2018-08-01 ENCOUNTER — Encounter: Payer: Self-pay | Admitting: Certified Nurse Midwife

## 2018-08-01 ENCOUNTER — Other Ambulatory Visit (HOSPITAL_COMMUNITY)
Admission: RE | Admit: 2018-08-01 | Discharge: 2018-08-01 | Disposition: A | Discharge status: Home / Self Care | Payer: Managed Care, Other (non HMO) | Source: Ambulatory Visit | Attending: Certified Nurse Midwife | Admitting: Certified Nurse Midwife

## 2018-08-01 VITALS — BP 118/80 | HR 64 | Resp 16 | Ht 62.5 in | Wt 193.0 lb

## 2018-08-01 DIAGNOSIS — Z113 Encounter for screening for infections with a predominantly sexual mode of transmission: Secondary | ICD-10-CM | POA: Diagnosis not present

## 2018-08-01 DIAGNOSIS — Z124 Encounter for screening for malignant neoplasm of cervix: Secondary | ICD-10-CM

## 2018-08-01 DIAGNOSIS — Z01419 Encounter for gynecological examination (general) (routine) without abnormal findings: Secondary | ICD-10-CM | POA: Diagnosis not present

## 2018-08-01 DIAGNOSIS — N912 Amenorrhea, unspecified: Secondary | ICD-10-CM

## 2018-08-01 DIAGNOSIS — Z30015 Encounter for initial prescription of vaginal ring hormonal contraceptive: Secondary | ICD-10-CM

## 2018-08-01 LAB — POCT URINE PREGNANCY: Preg Test, Ur: NEGATIVE

## 2018-08-01 MED ORDER — ETONOGESTREL-ETHINYL ESTRADIOL 0.12-0.015 MG/24HR VA RING
1.0000 | VAGINAL_RING | VAGINAL | 3 refills | Status: DC
Start: 2018-08-01 — End: 2019-11-16

## 2018-08-01 NOTE — Patient Instructions (Signed)

## 2018-08-01 NOTE — Progress Notes (Signed)
28 y.o. G1P1001 Single  Caucasian Fe here for annual exam. Patient had a daughter 02/2018, now 16 pounds! Also has 81 year old son. STD screening requested due to history of Trichomonas. Has some vaginal odor and itching off and on for the past few months. Daughter treated for yeast also with breastfeeding and so was patient. Has stopped breastfeeding now.  Working on weight loss from pregnancy now. Partner very supportive and helpful with family. Contraception none, would like to start back with Nuvaring use, worked well before. No other health issues today.  Patient's last menstrual period was 06/19/2018 (exact date).          Sexually active: Yes.    The current method of family planning is none.    Exercising: No.  exercise Smoker:  no  Review of Systems  Constitutional: Negative.   HENT: Negative.   Eyes: Negative.   Respiratory: Negative.   Cardiovascular: Negative.   Gastrointestinal: Negative.   Genitourinary: Negative.   Musculoskeletal: Negative.   Skin:       Vaginal itching & discharge, irritation  Neurological: Negative.   Endo/Heme/Allergies: Negative.   Psychiatric/Behavioral: Negative.     Health Maintenance: Pap:  2015 neg, says she didn't get one after birth of daughter History of Abnormal Pap: unsure MMG:  none Self Breast exams: yes Colonoscopy:  none BMD:   none TDaP:  2019 Shingles: no Pneumonia: no Hep C and HIV: both neg 2018 Labs: as needed   reports that she has never smoked. She has never used smokeless tobacco. She reports current alcohol use. She reports that she does not use drugs.  Past Medical History:  Diagnosis Date  . Anxiety     Past Surgical History:  Procedure Laterality Date  . CESAREAN SECTION     times 2  . TONSILLECTOMY  2001    No current outpatient medications on file.   No current facility-administered medications for this visit.     Family History  Problem Relation Age of Onset  . Hypertension Mother   . Heart  attack Father        times 2  . Hypertension Father   . Alzheimer's disease Maternal Grandmother   . Irregular heart beat Maternal Grandmother   . Stroke Maternal Grandfather   . Gallbladder disease Maternal Grandfather   . Cancer Paternal Grandfather        Colon    ROS:  Pertinent items are noted in HPI.  Otherwise, a comprehensive ROS was negative.  Exam:   BP 118/80   Pulse 64   Resp 16   Ht 5' 2.5" (1.588 m)   Wt 193 lb (87.5 kg)   LMP 06/19/2018 (Exact Date)   BMI 34.74 kg/m  Height: 5' 2.5" (158.8 cm) Ht Readings from Last 3 Encounters:  08/01/18 5' 2.5" (1.588 m)  04/29/17 5' 2.25" (1.581 m)  03/25/17 5' 2.25" (1.581 m)    General appearance: alert, cooperative and appears stated age Head: Normocephalic, without obvious abnormality, atraumatic Neck: no adenopathy, supple, symmetrical, trachea midline and thyroid normal to inspection and palpation Lungs: clear to auscultation bilaterally Breasts: normal appearance, no masses or tenderness, No nipple retraction or dimpling, No nipple discharge or bleeding, No axillary or supraclavicular adenopathy Heart: regular rate and rhythm Abdomen: soft, non-tender; no masses,  no organomegaly Extremities: extremities normal, atraumatic, no cyanosis or edema Skin: Skin color, texture, turgor normal. No rashes or lesions Lymph nodes: Cervical, supraclavicular, and axillary nodes normal. No abnormal inguinal nodes palpated  Neurologic: Grossly normal   Pelvic: External genitalia:  no lesions, but vulva dryness noted, no cracking or exudate              Urethra:  normal appearing urethra with no masses, tenderness or lesions              Bartholin's and Skene's: normal                 Vagina: normal appearing vagina with normal color and discharge, no lesions              Cervix: no cervical motion tenderness, no lesions and normal appearance              Pap taken: Yes.   Bimanual Exam:  Uterus:  normal size, contour, position,  consistency, mobility, non-tender and anteverted              Adnexa: normal adnexa and no mass, fullness, tenderness               Rectovaginal: Confirms               Anus:  Normal appearance, no lesions  Chaperone present: yes  A:  Well Woman with normal exam  Contraception desired previous Nuvaring use  Pregnancy with delivery 03/03/18, daughter per C/section  History of trichomonas during pregnancy, desires STD screening, vaginal only  P:   Reviewed health and wellness pertinent to exam  Discussed risks/benefits/warning signs with use. Start with next period. Given information booklet. Condom use recommended.  Rx Nuvaring see order with instructions, will call to advise if working well for refills  Discussed vulva dryness finding and discussed moisture to area such as vaseline external only. Dove soap to clean with . Questions addressed.  Labs: Gc/chlamydia, Affirm  Pap smear: yes   counseled on breast self exam, STD prevention, HIV risk factors and prevention, adequate intake of calcium and vitamin D, diet and exercise  return annually or prn  An After Visit Summary was printed and given to the patient.

## 2018-08-02 LAB — VAGINITIS/VAGINOSIS, DNA PROBE
CANDIDA SPECIES: NEGATIVE
Gardnerella vaginalis: NEGATIVE
TRICHOMONAS VAG: POSITIVE — AB

## 2018-08-02 LAB — CYTOLOGY - PAP
DIAGNOSIS: NEGATIVE
HPV (WINDOPATH): NOT DETECTED

## 2018-08-02 LAB — GC/CHLAMYDIA PROBE AMP
Chlamydia trachomatis, NAA: NEGATIVE
NEISSERIA GONORRHOEAE BY PCR: NEGATIVE

## 2018-08-03 ENCOUNTER — Telehealth: Payer: Self-pay | Admitting: *Deleted

## 2018-08-03 MED ORDER — TINIDAZOLE 500 MG PO TABS: 2.0000 g | ORAL_TABLET | Freq: Once | ORAL | 0 refills | Status: AC

## 2018-08-03 NOTE — Telephone Encounter (Signed)
Call to patient. Results reviewed with patient and she verbalized understanding. Pharmacy on file confirmed. Instructions on use of Tindamax reviewed with patient and she verbalized understanding. Patient aware not to take Tindamax until she has a period. Patient anticipates her cycle to start on 08-11-18. Condoms discussed and patient aware to abstain from intercourse for 7 days after treatment of herself and her partner. Advised patient for her and her partner to take medication at the same time so they know when to abstain from intercourse. Two week TOC scheduled for 08-25-2018 at 0800, but patient aware to reschedule based off cycle and when she takes the Tindamax. Expedited Partner Therapy discussed with patient and name, DOB and allergies of partner provided by patient. Patient states her partner has taken Flagyl before and tolerated fine. Advised patient, written prescription for her partner would be available for her to come pick up at the front office. Patient agreeable. Prescription for Tindamax 2g, #4, 0RF sent to confirmed pharmacy on file. Patient has aex scheduled on 08-08-19.   Routing to provider and will close encounter.

## 2018-08-03 NOTE — Telephone Encounter (Signed)
-----   Message from Verner Choleborah S Leonard, CNM sent at 08/03/2018  7:47 AM EST ----- Notify patient her Chlamydia, GC were negative Vaginal screen was positive for Trichomonas and will need partner treatment also. Patient treated previously and did not tolerate Flagyl well, so she can do Tindamax 2 Gm orally in single dose but will need to have had period and used contraception as we discussed. She will need TOC at 2 weeks Yeast and BV negative Pap smear is negative HPV not detected 02   Trichomonas also noted on pap

## 2018-08-25 ENCOUNTER — Ambulatory Visit: Payer: Self-pay | Admitting: Certified Nurse Midwife

## 2018-08-25 ENCOUNTER — Encounter: Payer: Self-pay | Admitting: Certified Nurse Midwife

## 2018-08-25 ENCOUNTER — Telehealth: Payer: Self-pay | Admitting: Certified Nurse Midwife

## 2018-08-25 NOTE — Telephone Encounter (Signed)
Patient dnka her appointment today for TOC. I left a message for her to call and reschedule.[facility] policy followed.

## 2018-09-11 NOTE — Telephone Encounter (Signed)
Left another message for the patient to cal and reschedule her TOC .

## 2018-09-13 NOTE — Telephone Encounter (Signed)
Okay to close this encounter? °

## 2018-09-13 NOTE — Telephone Encounter (Signed)
Ok to close encounter, she is aware this was an STD and needed follow up

## 2019-02-03 ENCOUNTER — Emergency Department (HOSPITAL_BASED_OUTPATIENT_CLINIC_OR_DEPARTMENT_OTHER)
Admission: EM | Admit: 2019-02-03 | Discharge: 2019-02-03 | Disposition: A | Discharge status: Home / Self Care | Payer: 59 | Attending: Emergency Medicine | Admitting: Emergency Medicine

## 2019-02-03 ENCOUNTER — Emergency Department (HOSPITAL_BASED_OUTPATIENT_CLINIC_OR_DEPARTMENT_OTHER): Payer: 59

## 2019-02-03 ENCOUNTER — Other Ambulatory Visit: Payer: Self-pay

## 2019-02-03 ENCOUNTER — Encounter (HOSPITAL_BASED_OUTPATIENT_CLINIC_OR_DEPARTMENT_OTHER): Payer: Self-pay | Admitting: Emergency Medicine

## 2019-02-03 DIAGNOSIS — R112 Nausea with vomiting, unspecified: Secondary | ICD-10-CM | POA: Insufficient documentation

## 2019-02-03 DIAGNOSIS — R1031 Right lower quadrant pain: Secondary | ICD-10-CM | POA: Diagnosis present

## 2019-02-03 LAB — URINALYSIS, ROUTINE W REFLEX MICROSCOPIC
Bilirubin Urine: NEGATIVE
Glucose, UA: NEGATIVE mg/dL
Ketones, ur: NEGATIVE mg/dL
Leukocytes,Ua: NEGATIVE
Nitrite: NEGATIVE
Protein, ur: NEGATIVE mg/dL
Specific Gravity, Urine: <1.005 — ABNORMAL LOW (ref 1.005–1.030)
pH: 7 (ref 5.0–8.0)

## 2019-02-03 LAB — COMPREHENSIVE METABOLIC PANEL
ALT: 19 U/L (ref 0–44)
AST: 18 U/L (ref 15–41)
Albumin: 4.1 g/dL (ref 3.5–5.0)
Alkaline Phosphatase: 70 U/L (ref 38–126)
Anion gap: 8 (ref 5–15)
BUN: 13 mg/dL (ref 6–20)
CO2: 24 mmol/L (ref 22–32)
Calcium: 8.9 mg/dL (ref 8.9–10.3)
Chloride: 106 mmol/L (ref 98–111)
Creatinine, Ser: 0.55 mg/dL (ref 0.44–1.00)
GFR calc Af Amer: >60 mL/min (ref >60)
GFR calc non Af Amer: >60 mL/min (ref >60)
Glucose, Bld: 119 mg/dL — ABNORMAL HIGH (ref 70–99)
Potassium: 3.4 mmol/L — ABNORMAL LOW (ref 3.5–5.1)
Sodium: 138 mmol/L (ref 135–145)
Total Bilirubin: 0.5 mg/dL (ref 0.3–1.2)
Total Protein: 7.2 g/dL (ref 6.5–8.1)

## 2019-02-03 LAB — CBC WITH DIFFERENTIAL/PLATELET
Abs Immature Granulocytes: 0.01 10*3/uL (ref 0.00–0.07)
Basophils Absolute: 0 10*3/uL (ref 0.0–0.1)
Basophils Relative: 0 %
Eosinophils Absolute: 0.2 10*3/uL (ref 0.0–0.5)
Eosinophils Relative: 2 %
HCT: 41.4 % (ref 36.0–46.0)
Hemoglobin: 12.8 g/dL (ref 12.0–15.0)
Immature Granulocytes: 0 %
Lymphocytes Relative: 33 %
Lymphs Abs: 2.2 10*3/uL (ref 0.7–4.0)
MCH: 25.5 pg — ABNORMAL LOW (ref 26.0–34.0)
MCHC: 30.9 g/dL (ref 30.0–36.0)
MCV: 82.5 fL (ref 80.0–100.0)
Monocytes Absolute: 0.6 10*3/uL (ref 0.1–1.0)
Monocytes Relative: 8 %
Neutro Abs: 3.9 10*3/uL (ref 1.7–7.7)
Neutrophils Relative %: 57 %
Platelets: 271 10*3/uL (ref 150–400)
RBC: 5.02 MIL/uL (ref 3.87–5.11)
RDW: 15.2 % (ref 11.5–15.5)
WBC: 6.9 10*3/uL (ref 4.0–10.5)
nRBC: 0 % (ref 0.0–0.2)

## 2019-02-03 LAB — PREGNANCY, URINE: Preg Test, Ur: NEGATIVE

## 2019-02-03 LAB — URINALYSIS, MICROSCOPIC (REFLEX)

## 2019-02-03 LAB — LIPASE, BLOOD: Lipase: 34 U/L (ref 11–51)

## 2019-02-03 MED ORDER — ONDANSETRON 4 MG PO TBDP: ORAL_TABLET | ORAL | 0 refills | Status: DC

## 2019-02-03 MED ORDER — KETOROLAC TROMETHAMINE 60 MG/2ML IM SOLN
30.0000 mg | Freq: Once | INTRAMUSCULAR | Status: AC
  Administered 2019-02-03: 30 mg via INTRAMUSCULAR
  Filled 2019-02-03: qty 2

## 2019-02-03 MED ORDER — ONDANSETRON 4 MG PO TBDP
4.0000 mg | ORAL_TABLET | Freq: Once | ORAL | Status: AC
  Administered 2019-02-03: 4 mg via ORAL
  Filled 2019-02-03: qty 1

## 2019-02-03 NOTE — ED Notes (Signed)
Gave patient sprite for po challenge.  

## 2019-02-03 NOTE — ED Notes (Signed)
Patient transported to CT 

## 2019-02-03 NOTE — ED Notes (Signed)
ED Provider at bedside. 

## 2019-02-03 NOTE — ED Triage Notes (Signed)
Awoke from sleep with lower abd pain and emesis times 2. Denies diarrhea.

## 2019-02-03 NOTE — ED Provider Notes (Signed)
Emergency Department Provider Note   I have reviewed the triage vital signs and the nursing notes.   HISTORY  Chief Complaint Abdominal Pain   HPI Stefanie Lucero is a 28 y.o. female who presents the emergency department today secondary to abdominal pain, vomiting.  Patient states that she was woken by severe right-sided abdominal cramping which seems to have improved a little bit but still has persistent right lower quadrant pain.  She also states that she had couple episodes of nonbloody nonbilious emesis during this.  States her urine is a bit darker than normal earlier today but no other urinary symptoms.  She is never had a kidney stone.  States that she is not related to eating.  States she is had an episode like this before but they always got better.  No fever or sick contacts.   No other associated or modifying symptoms.    Past Medical History:  Diagnosis Date  . Anxiety     Patient Active Problem List   Diagnosis Date Noted  . Trichimoniasis 01/23/2018  . 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 CESAREAN SECTION     times 2  . TONSILLECTOMY  2001    Current Outpatient Rx  . Order #: [id number] Class: Normal  . Order #: [id number] Class: Print    Allergies Iodine and Nickel  Family History  Problem Relation Age of Onset  . Hypertension Mother   . Heart attack Father        times 2  . Hypertension Father   . Alzheimer's disease Maternal Grandmother   . Irregular heart beat Maternal Grandmother   . Stroke Maternal Grandfather   . Gallbladder disease Maternal Grandfather   . Cancer Paternal Grandfather        Colon    Social History Social History   Tobacco Use  . Smoking status: Never Smoker  . Smokeless tobacco: Never Used  Substance Use Topics  . Alcohol use: Yes    Comment: 1-2 every 2 weeks  . Drug use: No    Review of Systems  All other systems  negative except as documented in the HPI. All pertinent positives and negatives as reviewed in the HPI. ____________________________________________   PHYSICAL EXAM:  VITAL SIGNS: ED Triage Vitals  Enc Vitals Group     BP 02/03/19 0141 122/72     Pulse Rate 02/03/19 0141 93     Resp 02/03/19 0141 (!) 22     Temp 02/03/19 0141 97.9 F (36.6 C)     Temp Source 02/03/19 0141 Oral     SpO2 02/03/19 0141 100 %     Weight 02/03/19 0143 190 lb (86.2 kg)     Height 02/03/19 0143 5\' 3"  (1.6 m)     Head Circumference --      Peak Flow --      Pain Score 02/03/19 0142 8     Pain Loc --      Pain Edu? --      Excl. in [place]? --     Constitutional: Alert and oriented. Well appearing and in no acute distress. Eyes: Conjunctivae are normal. PERRL. EOMI. Head: Atraumatic. Nose: No congestion/rhinnorhea. Mouth/Throat: Mucous membranes are moist.  Oropharynx non-erythematous. Neck: No stridor.  No meningeal signs.   Cardiovascular: Normal rate, regular rhythm. Good peripheral circulation. Grossly normal heart sounds.   Respiratory: Normal respiratory effort.  No retractions. Lungs CTAB. Gastrointestinal: Soft  and ttp to RLQ, pain there with heeltap as well. No distention.  Musculoskeletal: No lower extremity tenderness nor edema. No gross deformities of extremities. Neurologic:  Normal speech and language. No gross focal neurologic deficits are appreciated.  Skin:  Skin is warm, dry and intact. No rash noted.   ____________________________________________   LABS (all labs ordered are listed, but only abnormal results are displayed)  Labs Reviewed  URINALYSIS, ROUTINE W REFLEX MICROSCOPIC - Abnormal; Notable for the following components:      Result Value   Specific Gravity, Urine <1.005 (*)    Hgb urine dipstick SMALL (*)    All other components within normal limits  URINALYSIS, MICROSCOPIC (REFLEX) - Abnormal; Notable for the following components:   Bacteria, UA RARE (*)    All other  components within normal limits  CBC WITH DIFFERENTIAL/PLATELET - Abnormal; Notable for the following components:   MCH 25.5 (*)    All other components within normal limits  COMPREHENSIVE METABOLIC PANEL - Abnormal; Notable for the following components:   Potassium 3.4 (*)    Glucose, Bld 119 (*)    All other components within normal limits  PREGNANCY, URINE  LIPASE, BLOOD   ____________________________________________  RADIOLOGY  No results found.  ____________________________________________   PROCEDURES  Procedure(s) performed:   Procedures   ____________________________________________   INITIAL IMPRESSION / ASSESSMENT AND PLAN / ED COURSE  Appendicitis vs gastroenteritis but pain seems to be more localized to RLQ with heel tap being positive, will get labs, give meds, ct scan.   Questionable iodine allergy. Will have to do it without.   Workup ok aside from incidental pancreatic finding which she will follow up with PCP for. otherwise feeling better than previously. Tolerating PO. Abdomen benign. Unclear etiology for symptoms at this time. Stable for dc.   Pertinent labs & imaging results that were available during my care of the patient were reviewed by me and considered in my medical decision making (see chart for details).  A medical screening exam was performed and I feel the patient has had an appropriate workup for their chief complaint at this time and likelihood of emergent condition existing is low. They have been counseled on decision, discharge, follow up and which symptoms necessitate immediate return to the emergency department. They or their family verbally stated understanding and agreement with plan and discharged in stable condition.   ____________________________________________  FINAL CLINICAL IMPRESSION(S) / ED DIAGNOSES  Final diagnoses:  Non-intractable vomiting with nausea, unspecified vomiting type     MEDICATIONS GIVEN DURING THIS  VISIT:  Medications  ondansetron (ZOFRAN-ODT) disintegrating tablet 4 mg (4 mg Oral Given 02/03/19 0350)  ketorolac (TORADOL) injection 30 mg (30 mg Intramuscular Given 02/03/19 0354)     NEW OUTPATIENT MEDICATIONS STARTED DURING THIS VISIT:  Discharge Medication List as of 02/03/2019  4:35 AM    START taking these medications   Details  ondansetron (ZOFRAN ODT) 4 MG disintegrating tablet 4mg  ODT q4 hours prn nausea/vomit, Print        Note:  This note was prepared with assistance of Dragon voice recognition software. Occasional wrong-word or sound-a-like substitutions may have occurred due to the inherent limitations of voice recognition software.   Caysie Minnifield, Barbara CowerJason, MD 02/04/19 [phone]

## 2019-02-08 ENCOUNTER — Other Ambulatory Visit: Payer: Self-pay | Admitting: Internal Medicine

## 2019-02-08 DIAGNOSIS — R1011 Right upper quadrant pain: Secondary | ICD-10-CM

## 2019-02-15 ENCOUNTER — Ambulatory Visit
Admission: RE | Admit: 2019-02-15 | Discharge: 2019-02-15 | Disposition: A | Discharge status: Home / Self Care | Payer: 59 | Source: Ambulatory Visit | Attending: Internal Medicine | Admitting: Internal Medicine

## 2019-02-15 DIAGNOSIS — R1011 Right upper quadrant pain: Secondary | ICD-10-CM

## 2019-08-08 ENCOUNTER — Ambulatory Visit: Payer: 59 | Admitting: Certified Nurse Midwife

## 2019-09-03 ENCOUNTER — Ambulatory Visit: Payer: 59 | Admitting: Certified Nurse Midwife

## 2019-09-11 NOTE — Progress Notes (Deleted)
29 y.o. [id number] Single  {Race/ethnicity:17218} Fe here for annual exam.    No LMP recorded.          Sexually active: {yes no:314532}  The current method of family planning is {contraception:315051}.    Exercising: {yes no:314532}  {types:19826} Smoker:  {YES NO:22349}  ROS  Health Maintenance: Pap:  08-01-2018 neg HPV HR neg, trich + History of Abnormal Pap: unsure MMG:  none Self Breast exams: {YES NO:22349} Colonoscopy:  none BMD:   none TDaP:  2019 Shingles: no Pneumonia: no Hep C and HIV: both neg 2018 Labs: ***   reports that she has never smoked. She has never used smokeless tobacco. She reports current alcohol use. She reports that she does not use drugs.  Past Medical History:  Diagnosis Date  . Anxiety     Past Surgical History:  Procedure Laterality Date  . CESAREAN SECTION     times 2  . TONSILLECTOMY  2001    Current Outpatient Medications  Medication Sig Dispense Refill  . etonogestrel-ethinyl estradiol (NUVARING) 0.12-0.015 MG/24HR vaginal ring Place 1 each vaginally every 28 (twenty-eight) days. Insert vaginally and leave in place for 3 consecutive weeks, then remove for 1 week. 1 each 3  . ondansetron (ZOFRAN ODT) 4 MG disintegrating tablet 4mg  ODT q4 hours prn nausea/vomit 20 tablet 0   No current facility-administered medications for this visit.    Family History  Problem Relation Age of Onset  . Hypertension Mother   . Heart attack Father        times 2  . Hypertension Father   . Alzheimer's disease Maternal Grandmother   . Irregular heart beat Maternal Grandmother   . Stroke Maternal Grandfather   . Gallbladder disease Maternal Grandfather   . Cancer Paternal Grandfather        Colon    ROS:  Pertinent items are noted in HPI.  Otherwise, a comprehensive ROS was negative.  Exam:   There were no vitals taken for this visit.   Ht Readings from Last 3 Encounters:  02/03/19 5\' 3"  (1.6 m)  08/01/18 5' 2.5" (1.588 m)  04/29/17 5' 2.25"  (1.581 m)    General appearance: alert, cooperative and appears stated age Head: Normocephalic, without obvious abnormality, atraumatic Neck: no adenopathy, supple, symmetrical, trachea midline and thyroid {EXAM; THYROID:18604} Lungs: clear to auscultation bilaterally Breasts: {Exam; breast:13139::"normal appearance, no masses or tenderness"} Heart: regular rate and rhythm Abdomen: soft, non-tender; no masses,  no organomegaly Extremities: extremities normal, atraumatic, no cyanosis or edema Skin: Skin color, texture, turgor normal. No rashes or lesions Lymph nodes: Cervical, supraclavicular, and axillary nodes normal. No abnormal inguinal nodes palpated Neurologic: Grossly normal   Pelvic: External genitalia:  no lesions              Urethra:  normal appearing urethra with no masses, tenderness or lesions              Bartholin's and Skene's: normal                 Vagina: normal appearing vagina with normal color and discharge, no lesions              Cervix: {exam; cervix:14595}              Pap taken: {yes no:314532} Bimanual Exam:  Uterus:  {exam; uterus:12215}              Adnexa: {exam; adnexa:12223}  Rectovaginal: Confirms               Anus:  normal sphincter tone, no lesions  Chaperone present: ***  A:  Well Woman with normal exam  P:   Reviewed health and wellness pertinent to exam  Pap smear: {YES NO:22349}  {plan; gyn:5269::"mammogram","pap smear","return annually or prn"}  An After Visit Summary was printed and given to the patient.

## 2019-09-12 ENCOUNTER — Telehealth: Payer: Self-pay | Admitting: Certified Nurse Midwife

## 2019-09-12 ENCOUNTER — Ambulatory Visit: Payer: 59 | Admitting: Certified Nurse Midwife

## 2019-09-12 ENCOUNTER — Encounter: Payer: Self-pay | Admitting: Certified Nurse Midwife

## 2019-09-12 NOTE — Telephone Encounter (Signed)
Patient did not keep scheduled appointment this morning. Letter sent.

## 2019-10-08 ENCOUNTER — Encounter: Payer: Self-pay | Admitting: Certified Nurse Midwife

## 2019-10-10 ENCOUNTER — Encounter: Payer: Self-pay | Admitting: Certified Nurse Midwife

## 2019-11-15 ENCOUNTER — Other Ambulatory Visit: Payer: Self-pay

## 2019-11-16 ENCOUNTER — Ambulatory Visit (INDEPENDENT_AMBULATORY_CARE_PROVIDER_SITE_OTHER): Payer: 59 | Admitting: Obstetrics & Gynecology

## 2019-11-16 ENCOUNTER — Encounter: Payer: Self-pay | Admitting: Obstetrics & Gynecology

## 2019-11-16 ENCOUNTER — Other Ambulatory Visit: Payer: Self-pay

## 2019-11-16 VITALS — BP 118/68 | HR 68 | Temp 98.3°F | Resp 16 | Ht 63.75 in | Wt 212.0 lb

## 2019-11-16 DIAGNOSIS — N898 Other specified noninflammatory disorders of vagina: Secondary | ICD-10-CM

## 2019-11-16 DIAGNOSIS — Z01419 Encounter for gynecological examination (general) (routine) without abnormal findings: Secondary | ICD-10-CM | POA: Diagnosis not present

## 2019-11-16 DIAGNOSIS — Z113 Encounter for screening for infections with a predominantly sexual mode of transmission: Secondary | ICD-10-CM | POA: Diagnosis not present

## 2019-11-16 DIAGNOSIS — Z0000 Encounter for general adult medical examination without abnormal findings: Secondary | ICD-10-CM

## 2019-11-16 LAB — POCT URINALYSIS DIPSTICK
Bilirubin, UA: NEGATIVE
Glucose, UA: NEGATIVE
Ketones, UA: NEGATIVE
Leukocytes, UA: NEGATIVE
Nitrite, UA: NEGATIVE
Protein, UA: NEGATIVE
Urobilinogen, UA: NEGATIVE E.U./dL — AB
pH, UA: 5 (ref 5.0–8.0)

## 2019-11-16 NOTE — Progress Notes (Signed)
29 y.o. [id number] Single White or Caucasian female here for annual exam, vaginal itching & std testing.  She's been having some vaginal itching and burning.  Would like to start contraception.  Cycles are regular.  Flow can be really heavy so would like  Sexually active: Yes.    The current method of family planning is none.    Exercising: No.  exercise Smoker:  no  Health Maintenance: Pap:  08-01-2018 neg HPV HR neg, trichomonas present 2020 (did not come for TOC) History of abnormal Pap:  unsure TDaP:  2019 Hep C testing: neg 2019 Screening Labs: poct urine-trace   reports that she has never smoked. She has never used smokeless tobacco. She reports current alcohol use. She reports that she does not use drugs.  Past Medical History:  Diagnosis Date  . Anxiety   . STD (sexually transmitted disease)    trichomonas treated 2020    Past Surgical History:  Procedure Laterality Date  . CESAREAN SECTION     times 2  . TONSILLECTOMY  2001    No current outpatient medications on file.   No current facility-administered medications for this visit.    Family History  Problem Relation Age of Onset  . Hypertension Mother   . Heart attack Father        times 2  . Hypertension Father   . Alzheimer's disease Maternal Grandmother   . Irregular heart beat Maternal Grandmother   . Stroke Maternal Grandfather   . Gallbladder disease Maternal Grandfather   . Colon cancer Paternal Grandfather 68            Review of Systems  Constitutional: Negative.   HENT: Negative.   Eyes: Negative.   Respiratory: Negative.   Cardiovascular: Negative.   Gastrointestinal: Negative.   Endocrine: Negative.   Genitourinary:       Vaginal burning, itching  Musculoskeletal: Negative.   Skin: Negative.   Allergic/Immunologic: Negative.   Neurological: Negative.   Psychiatric/Behavioral: Negative.     Exam:   BP 118/68   Pulse 68   Temp 98.3 F (36.8 C) (Skin)   Resp 16   Ht 5' 3.75" (1.619  m)   Wt 212 lb (96.2 kg)   LMP 11/02/2019 (Exact Date)   BMI 36.68 kg/m   Height: 5' 3.75" (161.9 cm)  General appearance: alert, cooperative and appears stated age Head: Normocephalic, without obvious abnormality, atraumatic Neck: no adenopathy, supple, symmetrical, trachea midline and thyroid normal to inspection and palpation Lungs: clear to auscultation bilaterally Breasts: normal appearance, no masses or tenderness Heart: regular rate and rhythm Abdomen: soft, non-tender; bowel sounds normal; no masses,  no organomegaly Extremities: extremities normal, atraumatic, no cyanosis or edema Skin: Skin color, texture, turgor normal. No rashes or lesions Lymph nodes: Cervical, supraclavicular, and axillary nodes normal. No abnormal inguinal nodes palpated Neurologic: Grossly normal   Pelvic: External genitalia:  no lesions              Urethra:  normal appearing urethra with no masses, tenderness or lesions              Bartholins and Skenes: normal                 Vagina: normal appearing vagina with normal color and discharge, no lesions              Cervix: no lesions              Pap taken: No. Bimanual Exam:  Uterus:  normal size, contour, position, consistency, mobility, non-tender              Adnexa: normal adnexa and no mass, fullness, tenderness               Rectovaginal: Confirms               Anus:  normal sphincter tone, no lesions  Chaperone, Royal Hawthorn, CMA, was present for exam.  A:  Well Woman with normal exam Contraception desires. Would like IUD placed. Migraines Desires STD testing  P:   Mammogram guidelines reviewed pap smear not indicated.  Neg 2020.   GC/CHl, trich, yeast/ BV testing obtained Pt will call with onset of cycle for IUD placement  HIV, RPR, Hep C obtained today (has received Hep B vaccine) Return annually or prn

## 2019-11-18 LAB — HIV ANTIBODY (ROUTINE TESTING W REFLEX): HIV Screen 4th Generation wRfx: NONREACTIVE

## 2019-11-18 LAB — RPR: RPR Ser Ql: NONREACTIVE

## 2019-11-18 LAB — HEPATITIS C ANTIBODY: Hep C Virus Ab: <0.1 s/co ratio (ref 0.0–0.9)

## 2019-11-19 ENCOUNTER — Telehealth: Payer: Self-pay

## 2019-11-19 LAB — NUSWAB VAGINITIS PLUS (VG+)
Candida albicans, NAA: POSITIVE — AB
Candida glabrata, NAA: NEGATIVE
Chlamydia trachomatis, NAA: NEGATIVE
Neisseria gonorrhoeae, NAA: NEGATIVE
Trich vag by NAA: NEGATIVE

## 2019-11-19 NOTE — Telephone Encounter (Signed)
Left message for call back.

## 2019-11-19 NOTE — Telephone Encounter (Signed)
-----   Message from Jerene Bears, MD sent at 11/19/2019 11:22 AM EDT ----- Please let pt know her vaginitis testing showed yeast.  Ok to treat with terazol 7 nightly x 7 nights or diflucan 150mg  po x 1, repeat in 72 hours if needed.  #2/0RF.  Also let her know her GC/CHl, trich were negative.  HIV, RPR and Hep C were negative as well.

## 2019-11-20 NOTE — Telephone Encounter (Signed)
Left message for call back.

## 2019-11-21 MED ORDER — FLUCONAZOLE 150 MG PO TABS: 150.0000 mg | ORAL_TABLET | Freq: Once | ORAL | 0 refills | Status: AC

## 2019-11-21 NOTE — Telephone Encounter (Signed)
Spoke with pt. Pt given results per Dr Hyacinth Meeker. Pt agreeable and verbalized understanding. Pt wants to treat yeast infection with Diflucan Rx.  Rx Diflucan 150 mg # 2, 0RF sent to pharmacy on file.   Routing to Dr Hyacinth Meeker for review.  Encounter closed.

## 2019-11-21 NOTE — Telephone Encounter (Signed)
Patient is returning call.  °

## 2019-11-22 ENCOUNTER — Telehealth: Payer: Self-pay | Admitting: Obstetrics & Gynecology

## 2019-11-22 DIAGNOSIS — Z30430 Encounter for insertion of intrauterine contraceptive device: Secondary | ICD-10-CM

## 2019-11-22 NOTE — Telephone Encounter (Signed)
Call placed to convey benefits for iud. Unable to leave a message voicemail is full.

## 2019-12-04 ENCOUNTER — Telehealth: Payer: Self-pay | Admitting: Obstetrics & Gynecology

## 2019-12-04 NOTE — Telephone Encounter (Signed)
Spoke with pt. Pt wanting to scheduled IUD insertion for Mirena.  LMP 5/11 and heaviest days day 2 and 3 with changing pad/tampon every 2-4 hours. Pt advised to take Motrin 800 mg with food and water one hour before procedure. Pt advised to remain abstinent until procedure. Pt has no current birth control method.  Pt scheduled for IUD insertion on 12/07/19 at 1130 with Dr Hyacinth Meeker.Pt agreeable and verbalized understanding.  Orders placed.  Routing to Dr Hyacinth Meeker for review.  Encounter closed.  Cc: Rosa for benefits/precert

## 2019-12-04 NOTE — Telephone Encounter (Signed)
Patient is ready to schedule her IUD insertion.  °

## 2019-12-04 NOTE — Telephone Encounter (Signed)
Second call placed to convey benefits unable to leave a message voicemail is full.

## 2019-12-06 ENCOUNTER — Other Ambulatory Visit: Payer: Self-pay

## 2019-12-07 ENCOUNTER — Encounter: Payer: Self-pay | Admitting: Obstetrics & Gynecology

## 2019-12-07 ENCOUNTER — Other Ambulatory Visit: Payer: Self-pay

## 2019-12-07 ENCOUNTER — Ambulatory Visit (INDEPENDENT_AMBULATORY_CARE_PROVIDER_SITE_OTHER): Payer: 59 | Admitting: Obstetrics & Gynecology

## 2019-12-07 VITALS — BP 112/80 | HR 70 | Temp 97.0°F | Resp 16 | Wt 211.0 lb

## 2019-12-07 DIAGNOSIS — Z30430 Encounter for insertion of intrauterine contraceptive device: Secondary | ICD-10-CM | POA: Diagnosis not present

## 2019-12-07 DIAGNOSIS — Z01812 Encounter for preprocedural laboratory examination: Secondary | ICD-10-CM

## 2019-12-07 DIAGNOSIS — Z8619 Personal history of other infectious and parasitic diseases: Secondary | ICD-10-CM

## 2019-12-07 DIAGNOSIS — Z30014 Encounter for initial prescription of intrauterine contraceptive device: Secondary | ICD-10-CM

## 2019-12-07 DIAGNOSIS — Z975 Presence of (intrauterine) contraceptive device: Secondary | ICD-10-CM | POA: Insufficient documentation

## 2019-12-07 LAB — POCT URINE PREGNANCY: Preg Test, Ur: NEGATIVE

## 2019-12-07 NOTE — Progress Notes (Signed)
29 y.o. [id number] Single Caucasian female presents for insertion of Mirena IUD.  Pt has been counseled about alternative forms of contraception including OCPs, progesterone options, sterilization procedures, condoms, and natural family planning.  She feels IUD is the better option for her.  Pt has also been counseled about risks and benefits as well as complications.  Consent is obtained today.  All questions answered prior to start of procedure.    Current contraception: none Last STD testing:  11/16/2019, neg GC/Chl, trich testing LMP:  Patient's last menstrual period was 12/03/2019 (exact date).  Patient Active Problem List   Diagnosis Date Noted  . Trichimoniasis 01/23/2018  . Intractable migraine 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 07/05/2016   Past Medical History:  Diagnosis Date  . Anxiety   . STD (sexually transmitted disease)    trichomonas treated 2020   No current outpatient medications on file prior to visit.   No current facility-administered medications on file prior to visit.   Iodine and Nickel  Review of Systems  All other systems reviewed and are negative.  Vitals:   12/07/19 1116  BP: 112/80  Pulse: 70  Resp: 16  Temp: (!) 97 F (36.1 C)  TempSrc: Skin  Weight: 211 lb (95.7 kg)    Gen:  WN[facility] healthy female NAD Abdomen: soft, non-tender Groin:  no inguinal nodes palpated  Pelvic exam: Vulva:  normal female genitalia Vagina:  normal vagina, no discharge, exudate, lesion, or erythema Cervix:  Non-tender, Negative CMT, no lesions or redness. Uterus:  normal shape, position and consistency   Procedure:  Speculum reinserted.  Cervix visualized and cleansed with Betadine x 3.  Paracervical block was not placed.  Single toothed tenaculum applied to anterior lip of cervix without difficulty.  Uterus sounded to 8cm.  Lot number: [id number].  Expiration:  11/2021.  IUD package was opened.  IUD and introducer passed to fundus and then withdrawn slightly before IUD  was passed into endometrial cavity.  Introducer removed.  Strings cut to 2cm.  Tenaculum removed from cervix.  Minimal bleeding noted.  Pt tolerated the procedure well.  All instruments removed from vagina.  A: Insertion of Mirena IUD Contraception desires  P:  Return for recheck 6-8 weeks Pt aware to call for any concerns Pt aware removal due no later than 12/06/2025.  IUD card given to pt.

## 2020-01-14 NOTE — Progress Notes (Signed)
GYNECOLOGY  VISIT  CC:   IUD recheck  HPI: 29 y.o. G47P2002 Single White or Caucasian female here for iud recheck. Mirena inserted 12-07-2019.  The first week after placement, she had significant cramping.  This seems to have completely resolved.  Cycle started 11/28/2019.  Is still having some light spotting but this is now dark looking and light.  Denies pain with intercourse.  Cannot feel strings but has very small hands.  GYNECOLOGIC HISTORY: Patient's last menstrual period was 12/29/2019 (exact date). Contraception: iud Menopausal hormone therapy: none  Patient Active Problem List   Diagnosis Date Noted  . History of trichomoniasis 12/07/2019  . IUD (intrauterine device) in place 12/07/2019  . Intractable migraine 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 07/05/2016    Past Medical History:  Diagnosis Date  . Anxiety   . STD (sexually transmitted disease)    trichomonas treated 2020    Past Surgical History:  Procedure Laterality Date  . CESAREAN SECTION     x 2  . INTRAUTERINE DEVICE (IUD) INSERTION     mirena inserted 12-07-2019  . TONSILLECTOMY  2001    MEDS:   Current Outpatient Medications on File Prior to Visit  Medication Sig Dispense Refill  . levonorgestrel (MIRENA) 20 MCG/24HR IUD 1 each by Intrauterine route once.     No current facility-administered medications on file prior to visit.    ALLERGIES: Iodine and Nickel  Family History  Problem Relation Age of Onset  . Hypertension Mother   . Heart attack Father        times 2  . Hypertension Father   . Alzheimer's disease Maternal Grandmother   . Irregular heart beat Maternal Grandmother   . Stroke Maternal Grandfather   . Gallbladder disease Maternal Grandfather   . Colon cancer Paternal Grandfather 63            SH:  Single, non smoker  Review of Systems  Constitutional: Negative.   HENT: Negative.   Eyes: Negative.   Respiratory: Negative.   Cardiovascular: Negative.   Gastrointestinal:  Negative.   Endocrine: Negative.   Genitourinary: Negative.   Musculoskeletal: Negative.   Skin: Negative.   Allergic/Immunologic: Negative.   Neurological: Negative.   Hematological: Negative.   Psychiatric/Behavioral: Negative.     PHYSICAL EXAMINATION:    Temp (!) 97 F (36.1 C) (Skin)   Wt 213 lb (96.6 kg)   LMP 12/29/2019 (Exact Date)   BMI 36.85 kg/m     General appearance: alert, cooperative and appears stated age Lymph:  no inguinal LAD noted  Pelvic: External genitalia:  no lesions              Urethra:  normal appearing urethra with no masses, tenderness or lesions              Bartholins and Skenes: normal                 Vagina: normal appearing vagina with normal color and discharge, no lesions              Cervix: no lesions and IUD string noted              Bimanual Exam:  Uterus:  normal size, contour, position, consistency, mobility, non-tender              Adnexa: no mass, fullness, tenderness  Chaperone, Terence Lux, CMA, was present for exam.  Assessment: IUD recheck Irregular bleeding since placement  Plan: Pt reassured about IUD  placement and bleeding.  However, if this continues into another cycle, would recommend she proceed with PUS for placement.  As pt is not having any pain, feel this is ok to monitor.  She will call if continues or with new concerns.

## 2020-01-15 ENCOUNTER — Other Ambulatory Visit: Payer: Self-pay

## 2020-01-15 ENCOUNTER — Ambulatory Visit (INDEPENDENT_AMBULATORY_CARE_PROVIDER_SITE_OTHER): Payer: 59 | Admitting: Obstetrics & Gynecology

## 2020-01-15 ENCOUNTER — Encounter: Payer: Self-pay | Admitting: Obstetrics & Gynecology

## 2020-01-15 VITALS — BP 118/78 | HR 68 | Temp 97.0°F | Resp 16 | Wt 213.0 lb

## 2020-01-15 DIAGNOSIS — Z30431 Encounter for routine checking of intrauterine contraceptive device: Secondary | ICD-10-CM

## 2020-07-28 ENCOUNTER — Emergency Department (HOSPITAL_BASED_OUTPATIENT_CLINIC_OR_DEPARTMENT_OTHER)
Admission: EM | Admit: 2020-07-28 | Discharge: 2020-07-28 | Disposition: A | Discharge status: Home / Self Care | Payer: 59 | Attending: Emergency Medicine | Admitting: Emergency Medicine

## 2020-07-28 ENCOUNTER — Emergency Department (HOSPITAL_BASED_OUTPATIENT_CLINIC_OR_DEPARTMENT_OTHER): Payer: 59

## 2020-07-28 ENCOUNTER — Encounter (HOSPITAL_BASED_OUTPATIENT_CLINIC_OR_DEPARTMENT_OTHER): Payer: Self-pay

## 2020-07-28 ENCOUNTER — Other Ambulatory Visit: Payer: Self-pay

## 2020-07-28 DIAGNOSIS — R0789 Other chest pain: Secondary | ICD-10-CM

## 2020-07-28 DIAGNOSIS — R0602 Shortness of breath: Secondary | ICD-10-CM | POA: Diagnosis not present

## 2020-07-28 DIAGNOSIS — R072 Precordial pain: Secondary | ICD-10-CM | POA: Diagnosis present

## 2020-07-28 DIAGNOSIS — R1110 Vomiting, unspecified: Secondary | ICD-10-CM | POA: Insufficient documentation

## 2020-07-28 LAB — CBC
HCT: 46.6 % — ABNORMAL HIGH (ref 36.0–46.0)
Hemoglobin: 15.2 g/dL — ABNORMAL HIGH (ref 12.0–15.0)
MCH: 29 pg (ref 26.0–34.0)
MCHC: 32.6 g/dL (ref 30.0–36.0)
MCV: 88.8 fL (ref 80.0–100.0)
Platelets: 313 10*3/uL (ref 150–400)
RBC: 5.25 MIL/uL — ABNORMAL HIGH (ref 3.87–5.11)
RDW: 14.3 % (ref 11.5–15.5)
WBC: 11.1 10*3/uL — ABNORMAL HIGH (ref 4.0–10.5)
nRBC: 0 % (ref 0.0–0.2)

## 2020-07-28 LAB — BASIC METABOLIC PANEL
Anion gap: 10 (ref 5–15)
BUN: 10 mg/dL (ref 6–20)
CO2: 23 mmol/L (ref 22–32)
Calcium: 9.4 mg/dL (ref 8.9–10.3)
Chloride: 106 mmol/L (ref 98–111)
Creatinine, Ser: 0.78 mg/dL (ref 0.44–1.00)
GFR, Estimated: >60 mL/min (ref >60)
Glucose, Bld: 99 mg/dL (ref 70–99)
Potassium: 3.8 mmol/L (ref 3.5–5.1)
Sodium: 139 mmol/L (ref 135–145)

## 2020-07-28 LAB — TROPONIN I (HIGH SENSITIVITY): Troponin I (High Sensitivity): <2 ng/L (ref <18)

## 2020-07-28 LAB — PREGNANCY, URINE: Preg Test, Ur: NEGATIVE

## 2020-07-28 NOTE — Discharge Instructions (Addendum)
Take ibuprofen 600 mg every 6 hours as needed for pain.  Return to the emergency department if you develop worsening pain, difficulty breathing, or other new and concerning symptoms.

## 2020-07-28 NOTE — ED Triage Notes (Addendum)
Pt states around 0800 this am began having sharp chest pain radiating to left shoulder and back. Worse with deep inspiration. States dyspnea on exertion. Denies history of blood clots. States began to feel nauseous and have chills. Denies sick contacts.

## 2020-07-28 NOTE — ED Provider Notes (Signed)
[facility] EMERGENCY DEPARTMENT Provider Note   CSN: [id number] Arrival date & time: 07/28/20  1821     History Chief Complaint  Patient presents with  . Chest Pain    Stefanie Lucero is a 30 y.o. female.  Patient is a 30 year old female with history of anxiety, irritable bowel.  She presents today for evaluation of chest pain.  Patient states she woke up this morning with nausea and feeling as though her IBS was acting up.  She had episodes of vomiting which is not uncommon for her.  Shortly afterward, she began to experience tightness in her chest along with feeling clammy.  This occurred while she was at work.  This lasted for approximately 1 hour, then seemed to resolve.  Patient presents here wanting to make sure that everything is okay.  The history is provided by the patient.  Chest Pain Pain location:  Substernal area Pain radiates to:  Does not radiate Pain severity:  Moderate Onset quality:  Sudden Timing:  Constant Progression:  Worsening Chronicity:  New Relieved by:  Nothing Worsened by:  Nothing Ineffective treatments:  None tried      Past Medical History:  Diagnosis Date  . Anxiety   . STD (sexually transmitted disease)    trichomonas treated 2020    Patient Active Problem List   Diagnosis Date Noted  . History of trichomoniasis 12/07/2019  . IUD (intrauterine device) in place 12/07/2019  . 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 CESAREAN SECTION     x 2  . INTRAUTERINE DEVICE (IUD) INSERTION     mirena inserted 12-07-2019  . TONSILLECTOMY  2001     OB History    Gravida  2   Para  2   Term  2   Preterm      AB      Living  2     SAB      IAB      Ectopic      Multiple      Live Births  2           Family History  Problem Relation Age of Onset  . Hypertension Mother   . Heart attack Father        times 2  .  Hypertension Father   . Alzheimer's disease Maternal Grandmother   . Irregular heart beat Maternal Grandmother   . Stroke Maternal Grandfather   . Gallbladder disease Maternal Grandfather   . Colon cancer Paternal Grandfather 29            Social History   Tobacco Use  . Smoking status: Never Smoker  . Smokeless tobacco: Never Used  Substance Use Topics  . Alcohol use: Yes    Comment: 1-2 every 2 weeks  . Drug use: No    Home Medications Prior to Admission medications   Medication Sig Start Date End Date Taking? Authorizing Provider  levonorgestrel (MIRENA) 20 MCG/24HR IUD 1 each by Intrauterine route once.    [provider]    Allergies    Iodine and Nickel  Review of Systems   Review of Systems  Cardiovascular: Positive for chest pain.  All other systems reviewed and are negative.   Physical Exam Updated Vital Signs BP 122/82 (BP Location: Left Arm)   Pulse 76   Temp 98.2 F (36.8 C) (Oral)   Resp 17   Ht  5\' 3"  (1.6 m)   Wt 91.2 kg   SpO2 99%   BMI 35.61 kg/m   Physical Exam Vitals and nursing note reviewed.  Constitutional:      General: She is not in acute distress.    Appearance: She is well-developed and well-nourished. She is not diaphoretic.  HENT:     Head: Normocephalic and atraumatic.  Cardiovascular:     Rate and Rhythm: Normal rate and regular rhythm.     Heart sounds: No murmur heard. No friction rub. No gallop.   Pulmonary:     Effort: Pulmonary effort is normal. No respiratory distress.     Breath sounds: Normal breath sounds. No wheezing.  Abdominal:     General: Bowel sounds are normal. There is no distension.     Palpations: Abdomen is soft.     Tenderness: There is no abdominal tenderness.  Musculoskeletal:        General: Normal range of motion.     Cervical back: Normal range of motion and neck supple.     Right lower leg: No tenderness. No edema.     Left lower leg: No tenderness. No edema.  Skin:    General:  Skin is warm and dry.  Neurological:     Mental Status: She is alert and oriented to person, place, and time.     ED Results / Procedures / Treatments   Labs (all labs ordered are listed, but only abnormal results are displayed) Labs Reviewed  CBC - Abnormal; Notable for the following components:      Result Value   WBC 11.1 (*)    RBC 5.25 (*)    Hemoglobin 15.2 (*)    HCT 46.6 (*)    All other components within normal limits  BASIC METABOLIC PANEL  PREGNANCY, URINE  TROPONIN I (HIGH SENSITIVITY)  TROPONIN I (HIGH SENSITIVITY)    EKG EKG Interpretation  Date/Time:  Monday July 28 2020 18:41:58 EST Ventricular Rate:  88 PR Interval:  150 QRS Duration: 74 QT Interval:  362 QTC Calculation: 438 R Axis:   36 Text Interpretation: Normal sinus rhythm Normal ECG No significant change since last tracing Confirmed by [id number] [phone]) on 07/28/2020 10:09:18 PM   Radiology DG Chest Port 1 View  Result Date: 07/28/2020 CLINICAL DATA:  Sharp chest pain, States dyspnea on exertion. States began to feel nauseous and have chills. EXAM: PORTABLE CHEST 1 VIEW COMPARISON:  Chest x-ray 12/10/2009 FINDINGS: The heart size and mediastinal contours are within normal limits. Soft tissue overlie bilateral peripheral lower lobes. No focal consolidation. No pulmonary edema. No pleural effusion. No pneumothorax. No acute osseous abnormality. IMPRESSION: No active disease. Electronically Signed   By: 12/12/2009 M.D.   On: 07/28/2020 19:26    Procedures Procedures (including critical care time)  Medications Ordered in ED Medications - No data to display  ED Course  I have reviewed the triage vital signs and the nursing notes.  Pertinent labs & imaging results that were available during my care of the patient were reviewed by me and considered in my medical decision making (see chart for details).    MDM Rules/Calculators/A&P  Patient is a 30 year old female presenting with chest  discomfort as described in the HPI.  Her work-up is unremarkable including EKG, troponin, and chest x-ray.  Patient seems to be feeling better now.    She has stable vitals with no hypoxia.  Highly doubt a cardiac etiology.  At this point, patient seems appropriate  for discharge with as needed return.  Final Clinical Impression(s) / ED Diagnoses Final diagnoses:  SOB (shortness of breath)    Rx / DC Orders ED Discharge Orders    None       Veryl Speak, MD 07/28/20 2335

## 2020-10-02 ENCOUNTER — Other Ambulatory Visit: Payer: Self-pay

## 2020-10-02 ENCOUNTER — Ambulatory Visit (INDEPENDENT_AMBULATORY_CARE_PROVIDER_SITE_OTHER): Payer: BC Managed Care – PPO | Admitting: Nurse Practitioner

## 2020-10-02 ENCOUNTER — Encounter: Payer: Self-pay | Admitting: Nurse Practitioner

## 2020-10-02 VITALS — BP 122/78

## 2020-10-02 DIAGNOSIS — Z30432 Encounter for removal of intrauterine contraceptive device: Secondary | ICD-10-CM | POA: Diagnosis not present

## 2020-10-02 DIAGNOSIS — R350 Frequency of micturition: Secondary | ICD-10-CM

## 2020-10-02 DIAGNOSIS — Z30015 Encounter for initial prescription of vaginal ring hormonal contraceptive: Secondary | ICD-10-CM | POA: Diagnosis not present

## 2020-10-02 DIAGNOSIS — N939 Abnormal uterine and vaginal bleeding, unspecified: Secondary | ICD-10-CM

## 2020-10-02 MED ORDER — ETONOGESTREL-ETHINYL ESTRADIOL 0.12-0.015 MG/24HR VA RING: 1.0000 | VAGINAL_RING | VAGINAL | 3 refills | Status: DC

## 2020-10-02 NOTE — Progress Notes (Signed)
   Acute Office Visit  Subjective:    Patient ID: Stefanie Lucero, female    DOB: 1991/01/04, 30 y.o.   MRN: [id number]   HPI 30 y.o. presents today for LLQ pain, irregular bleeding and cramping. Mirena IUD inserted 11/2019. Since insertion she had monthly cycles until September, then no cycle until December, and since then she has had 2 cycles per month that last around 7 days with heavy bleeding 3-4 of those days. She also feels her mood has changed and she has a decreased libido since insertion. She was on Nuvraring in the past and is interested in restarting this. She also complains of urinary frequency without dysuria, urgency, or hematuria.    Review of Systems  Constitutional: Negative.   Gastrointestinal: Positive for abdominal pain (LLQ).  Genitourinary: Positive for frequency, menstrual problem and vaginal bleeding. Negative for dysuria, flank pain, hematuria, urgency, vaginal discharge and vaginal pain.       Objective:    Physical Exam Constitutional:      Appearance: Normal appearance.  Abdominal:     Tenderness: There is no abdominal tenderness. There is no right CVA tenderness or left CVA tenderness.  Genitourinary:    General: Normal vulva.     Vagina: Normal.     Cervix: Normal.     Comments: IUD string visible at external os    BP 122/78  Wt Readings from Last 3 Encounters:  07/28/20 201 lb (91.2 kg)  01/15/20 213 lb (96.6 kg)  12/07/19 211 lb (95.7 kg)   UA negative     Assessment & Plan:   Problem List Items Addressed This Visit   None   Visit Diagnoses    Abnormal uterine bleeding    -  Primary   Frequency of urination       Relevant Orders   Urinalysis,Complete w/RFL Culture   Encounter for initial prescription of vaginal ring hormonal contraceptive       Relevant Medications   etonogestrel-ethinyl estradiol (NUVARING) 0.12-0.015 MG/24HR vaginal ring   Encounter for IUD removal         Plan: Reassurance provided that IUD is in correct  position. We discussed options to continue IUD and start on low dose combination birth control pill to help with irregular bleeding versus changing contraception altogether. She wants IUD removed and to restart Nuvaring. IUD string grasped, removed with ease, shown to patient, and discarded. Nuvaring prescription discussed and she would like to use continuously to avoid menses. She is aware to skip placebo week and exchange ring every 28 days. All questions answered.    Olivia Mackie [facility], 12:16 PM 10/02/2020

## 2020-10-03 ENCOUNTER — Encounter: Payer: Self-pay | Admitting: Nurse Practitioner

## 2020-10-04 LAB — URINALYSIS, COMPLETE W/RFL CULTURE
Bacteria, UA: NONE SEEN /HPF
Bilirubin Urine: NEGATIVE
Glucose, UA: NEGATIVE
Hyaline Cast: NONE SEEN /LPF
Ketones, ur: NEGATIVE
Leukocyte Esterase: NEGATIVE
Nitrites, Initial: NEGATIVE
Protein, ur: NEGATIVE
Specific Gravity, Urine: 1.015 (ref 1.001–1.03)
WBC, UA: NONE SEEN /HPF (ref 0–5)
pH: 7 (ref 5.0–8.0)

## 2020-10-04 LAB — URINE CULTURE
MICRO NUMBER:: 11632058
Result:: NO GROWTH
SPECIMEN QUALITY:: ADEQUATE

## 2020-10-04 LAB — CULTURE INDICATED

## 2020-10-08 ENCOUNTER — Encounter (HOSPITAL_BASED_OUTPATIENT_CLINIC_OR_DEPARTMENT_OTHER): Payer: Self-pay | Admitting: Emergency Medicine

## 2020-10-08 ENCOUNTER — Other Ambulatory Visit: Payer: Self-pay

## 2020-10-08 DIAGNOSIS — K200 Eosinophilic esophagitis: Secondary | ICD-10-CM | POA: Diagnosis not present

## 2020-10-08 DIAGNOSIS — R55 Syncope and collapse: Secondary | ICD-10-CM | POA: Insufficient documentation

## 2020-10-08 DIAGNOSIS — M25552 Pain in left hip: Secondary | ICD-10-CM | POA: Insufficient documentation

## 2020-10-08 DIAGNOSIS — K219 Gastro-esophageal reflux disease without esophagitis: Secondary | ICD-10-CM | POA: Diagnosis not present

## 2020-10-08 DIAGNOSIS — G43109 Migraine with aura, not intractable, without status migrainosus: Secondary | ICD-10-CM | POA: Diagnosis not present

## 2020-10-08 DIAGNOSIS — K259 Gastric ulcer, unspecified as acute or chronic, without hemorrhage or perforation: Secondary | ICD-10-CM | POA: Diagnosis not present

## 2020-10-08 DIAGNOSIS — R9431 Abnormal electrocardiogram [ECG] [EKG]: Secondary | ICD-10-CM | POA: Diagnosis not present

## 2020-10-08 DIAGNOSIS — K2930 Chronic superficial gastritis without bleeding: Secondary | ICD-10-CM | POA: Diagnosis not present

## 2020-10-08 NOTE — ED Triage Notes (Signed)
Patient presents with complaints of left calf pain onset 1 week ago; seen by pcp today for same; states had IUD removed recently; complains of vomiting onset 4 days ago; no active vomiting noted; also complains of persistent migraines states worse since onset of leg pain. Ambulatory with steady gait.

## 2020-10-09 ENCOUNTER — Emergency Department (HOSPITAL_BASED_OUTPATIENT_CLINIC_OR_DEPARTMENT_OTHER): Payer: Self-pay

## 2020-10-09 ENCOUNTER — Emergency Department (HOSPITAL_BASED_OUTPATIENT_CLINIC_OR_DEPARTMENT_OTHER)
Admission: EM | Admit: 2020-10-09 | Discharge: 2020-10-09 | Disposition: A | Discharge status: Home / Self Care | Payer: Self-pay | Attending: Emergency Medicine | Admitting: Emergency Medicine

## 2020-10-09 ENCOUNTER — Emergency Department (HOSPITAL_BASED_OUTPATIENT_CLINIC_OR_DEPARTMENT_OTHER)
Admit: 2020-10-09 | Discharge: 2020-10-09 | Disposition: A | Discharge status: Home / Self Care | Payer: Self-pay | Attending: Emergency Medicine | Admitting: Emergency Medicine

## 2020-10-09 DIAGNOSIS — M79662 Pain in left lower leg: Secondary | ICD-10-CM

## 2020-10-09 DIAGNOSIS — G43109 Migraine with aura, not intractable, without status migrainosus: Secondary | ICD-10-CM

## 2020-10-09 HISTORY — DX: Migraine, unspecified, not intractable, without status migrainosus: G43.909

## 2020-10-09 LAB — CBC WITH DIFFERENTIAL/PLATELET
Abs Immature Granulocytes: 0.01 10*3/uL (ref 0.00–0.07)
Basophils Absolute: 0 10*3/uL (ref 0.0–0.1)
Basophils Relative: 0 %
Eosinophils Absolute: 0.2 10*3/uL (ref 0.0–0.5)
Eosinophils Relative: 2 %
HCT: 44.4 % (ref 36.0–46.0)
Hemoglobin: 14.7 g/dL (ref 12.0–15.0)
Immature Granulocytes: 0 %
Lymphocytes Relative: 32 %
Lymphs Abs: 2.9 10*3/uL (ref 0.7–4.0)
MCH: 29.4 pg (ref 26.0–34.0)
MCHC: 33.1 g/dL (ref 30.0–36.0)
MCV: 88.8 fL (ref 80.0–100.0)
Monocytes Absolute: 0.7 10*3/uL (ref 0.1–1.0)
Monocytes Relative: 8 %
Neutro Abs: 5.3 10*3/uL (ref 1.7–7.7)
Neutrophils Relative %: 58 %
Platelets: 257 10*3/uL (ref 150–400)
RBC: 5 MIL/uL (ref 3.87–5.11)
RDW: 12.9 % (ref 11.5–15.5)
WBC: 9.2 10*3/uL (ref 4.0–10.5)
nRBC: 0 % (ref 0.0–0.2)

## 2020-10-09 LAB — BASIC METABOLIC PANEL
Anion gap: 10 (ref 5–15)
BUN: 11 mg/dL (ref 6–20)
CO2: 26 mmol/L (ref 22–32)
Calcium: 9.4 mg/dL (ref 8.9–10.3)
Chloride: 103 mmol/L (ref 98–111)
Creatinine, Ser: 0.73 mg/dL (ref 0.44–1.00)
GFR, Estimated: >60 mL/min (ref >60)
Glucose, Bld: 104 mg/dL — ABNORMAL HIGH (ref 70–99)
Potassium: 3.6 mmol/L (ref 3.5–5.1)
Sodium: 139 mmol/L (ref 135–145)

## 2020-10-09 LAB — MAGNESIUM: Magnesium: 2 mg/dL (ref 1.7–2.4)

## 2020-10-09 LAB — PREGNANCY, URINE: Preg Test, Ur: NEGATIVE

## 2020-10-09 MED ORDER — DIPHENHYDRAMINE HCL 50 MG/ML IJ SOLN
25.0000 mg | Freq: Once | INTRAMUSCULAR | Status: AC
  Administered 2020-10-09: 25 mg via INTRAVENOUS
  Filled 2020-10-09: qty 1

## 2020-10-09 MED ORDER — METOCLOPRAMIDE HCL 5 MG/ML IJ SOLN
10.0000 mg | Freq: Once | INTRAMUSCULAR | Status: AC
  Administered 2020-10-09: 10 mg via INTRAVENOUS
  Filled 2020-10-09: qty 2

## 2020-10-09 MED ORDER — IOHEXOL 350 MG/ML SOLN
75.0000 mL | Freq: Once | INTRAVENOUS | Status: AC | PRN
  Administered 2020-10-09: 75 mL via INTRAVENOUS

## 2020-10-09 MED ORDER — SODIUM CHLORIDE 0.9 % IV BOLUS
1000.0000 mL | Freq: Once | INTRAVENOUS | Status: AC
  Administered 2020-10-09: 1000 mL via INTRAVENOUS

## 2020-10-09 MED ORDER — KETOROLAC TROMETHAMINE 15 MG/ML IJ SOLN
15.0000 mg | Freq: Once | INTRAMUSCULAR | Status: AC
  Administered 2020-10-09: 15 mg via INTRAVENOUS
  Filled 2020-10-09: qty 1

## 2020-10-09 NOTE — ED Provider Notes (Signed)
[facility] EMERGENCY DEPARTMENT Provider Note   CSN: [id number] Arrival date & time: 10/08/20  2335     History Chief Complaint  Patient presents with  . multiple complaints    Stefanie Lucero is a 30 y.o. female.  HPI 30 year old female presents with a chief complaint of headache.  Has a history of migraines but recently her headaches have been coming on more frequently and more severe.  Especially for the last 2 weeks.  Seems to come and go every few days.  Can be very severe when they occur. She also is getting an aura with the headaches. Has had frequent vomiting.  No fevers.  Headache is bitemporal.  Has also noticed a couple weeks of left leg cramping, worse at night.  No swelling.  The pain will radiate up into her hip which hurts to move.  No injuries.  No back pain.  Has tried Tylenol and meclizine without relief.  She will feel like she is about to pass out when the headache is bad.  Gets lightheaded.  Sometimes she will wake up short of breath but she thinks part of it might be anxiety.  She has no shortness of breath at other times in the day or with ambulation.  Past Medical History:  Diagnosis Date  . Anxiety   . Migraine   . STD (sexually transmitted disease)    trichomonas treated 2020    Patient Active Problem List   Diagnosis Date Noted  . History of trichomoniasis 12/07/2019  . IUD (intrauterine device) in place 12/07/2019  . 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 CESAREAN SECTION     x 2  . INTRAUTERINE DEVICE (IUD) INSERTION     mirena inserted 12-07-2019  . TONSILLECTOMY  2001     OB History    Gravida  2   Para  2   Term  2   Preterm      AB      Living  2     SAB      IAB      Ectopic      Multiple      Live Births  2           Family History  Problem Relation Age of Onset  . Hypertension Mother   . Heart attack Father         times 2  . Hypertension Father   . Alzheimer's disease Maternal Grandmother   . Irregular heart beat Maternal Grandmother   . Stroke Maternal Grandfather   . Gallbladder disease Maternal Grandfather   . Colon cancer Paternal Grandfather 60            Social History   Tobacco Use  . Smoking status: Never Smoker  . Smokeless tobacco: Never Used  Substance Use Topics  . Alcohol use: Not Currently    Comment: 1-2 every 2 weeks  . Drug use: No    Home Medications Prior to Admission medications   Medication Sig Start Date End Date Taking? Authorizing Provider  acetaminophen (TYLENOL) 325 MG tablet Take 650 mg by mouth every 6 (six) hours as needed.    [provider]  etonogestrel-ethinyl estradiol (NUVARING) 0.12-0.015 MG/24HR vaginal ring Place 1 each vaginally every 28 (twenty-eight) days. Insert vaginally and leave in place for 3 consecutive weeks, then remove for 1 week. 10/02/20   Olivia Mackie, NP  meclizine (ANTIVERT) 12.5 MG tablet Take 25 mg by mouth 2 (two) times daily as needed. 08/15/20   [provider]  omeprazole (PRILOSEC) 40 MG capsule Take 40 mg by mouth daily.    [provider]    Allergies    Ceftin [cefuroxime], Iodine, and Nickel  Review of Systems   Review of Systems  Constitutional: Negative for fever.  Gastrointestinal: Positive for vomiting.  Musculoskeletal: Positive for myalgias.  Neurological: Positive for light-headedness and headaches. Negative for weakness and numbness.  All other systems reviewed and are negative.   Physical Exam Updated Vital Signs BP 119/79 (BP Location: Right Arm)   Pulse 82   Temp 98.3 F (36.8 C) (Oral)   Resp 18   Ht 5\' 3"  (1.6 m)   Wt 91.2 kg   SpO2 97%   BMI 35.62 kg/m   Physical Exam Vitals and nursing note reviewed.  Constitutional:      Appearance: She is well-developed.  HENT:     Head: Normocephalic and atraumatic.     Right Ear: External ear normal.     Left  Ear: External ear normal.     Nose: Nose normal.  Eyes:     General:        Right eye: No discharge.        Left eye: No discharge.     Extraocular Movements: Extraocular movements intact.     Pupils: Pupils are equal, round, and reactive to light.  Cardiovascular:     Rate and Rhythm: Normal rate and regular rhythm.     Pulses: Normal pulses.     Heart sounds: Normal heart sounds.  Pulmonary:     Effort: Pulmonary effort is normal.     Breath sounds: Normal breath sounds.  Abdominal:     Palpations: Abdomen is soft.     Tenderness: There is no abdominal tenderness.  Musculoskeletal:     Cervical back: Neck supple.     Comments: Left calf with no tenderness, swelling or erythema Left hip mildly painful with ROM  Skin:    General: Skin is warm and dry.  Neurological:     Mental Status: She is alert.     Comments: CN 3-12 grossly intact. 5/5 strength in all 4 extremities. Grossly normal sensation. Normal finger to nose.   Psychiatric:        Mood and Affect: Mood is not anxious.     ED Results / Procedures / Treatments   Labs (all labs ordered are listed, but only abnormal results are displayed) Labs Reviewed  BASIC METABOLIC PANEL - Abnormal; Notable for the following components:      Result Value   Glucose, Bld 104 (*)    All other components within normal limits  CBC WITH DIFFERENTIAL/PLATELET  MAGNESIUM  PREGNANCY, URINE    EKG EKG Interpretation  Date/Time:  Thursday October 09 2020 01:26:47 EDT Ventricular Rate:  68 PR Interval:    QRS Duration: 102 QT Interval:  408 QTC Calculation: 434 R Axis:   60 Text Interpretation: Sinus rhythm no acute ST/T changes rate a little slower compared to Jan 2022 Confirmed by Feb 2022 [phone]) on 10/09/2020 2:18:29 AM   Radiology CT VENOGRAM HEAD  Result Date: 10/09/2020 CLINICAL DATA:  History of migraines. Dural venous sinus thrombosis suspected. EXAM: CT VENOGRAM HEAD TECHNIQUE: Noncontrast head CT was first  obtained. Following bolus administration of iodinated contrast a repeat CT was performed in the venous phase. CONTRAST:  80mL OMNIPAQUE IOHEXOL 350 MG/ML  SOLN COMPARISON:  Head CT 07/04/2016 FINDINGS: Brain: Negative for infarct, hemorrhage, hydrocephalus, mass, or collection. Vascular: The dural sinuses are diffusely enhancing. No visible cortical vein thrombus. Deep veins are symmetrically enhancing. Unremarkable arterial structures. Skull: Negative for fracture or bone lesion. Sinuses and orbits: Clear sinuses and negative orbits. IMPRESSION: Negative study. No dural sinus thrombosis or other cause for headache. Electronically Signed   By: Marnee Spring M.D.   On: 10/09/2020 04:34   DG Hip Unilat W or Wo Pelvis 2-3 Views Left  Result Date: 10/09/2020 CLINICAL DATA:  Left hip pain for 1 week EXAM: DG HIP (WITH OR WITHOUT PELVIS) 2-3V LEFT COMPARISON:  None. FINDINGS: There is no evidence of hip fracture or dislocation. There is no evidence of arthropathy or other focal bone abnormality. IMPRESSION: Negative. Electronically Signed   By: Marnee Spring M.D.   On: 10/09/2020 04:49    Procedures Procedures   Medications Ordered in ED Medications  metoCLOPramide (REGLAN) injection 10 mg (10 mg Intravenous Given 10/09/20 0201)  diphenhydrAMINE (BENADRYL) injection 25 mg (25 mg Intravenous Given 10/09/20 0201)  ketorolac (TORADOL) 15 MG/ML injection 15 mg (15 mg Intravenous Given 10/09/20 0201)  sodium chloride 0.9 % bolus 1,000 mL ( Intravenous Stopped 10/09/20 0303)  iohexol (OMNIPAQUE) 350 MG/ML injection 75 mL (75 mLs Intravenous Contrast Given 10/09/20 0401)    ED Course  I have reviewed the triage vital signs and the nursing notes.  Pertinent labs & imaging results that were available during my care of the patient were reviewed by me and considered in my medical decision making (see chart for details).    MDM Rules/Calculators/A&P                          Given her on and off headaches  with near syncope, CT venogram was obtained.  This does not show obvious dural venous sinus thrombosis.  She has some nonspecific calf pain but no swelling.  Will get ultrasound as an outpatient but I have lower suspicion this is DVT.  Labs are unremarkable.  She is feeling significantly better with no headache after treatment.  I doubt aneurysmal headache.  She is having an aura so probably all is related to her migraines, which for whatever reason are getting worse.  Doubt infection.  She appears stable for discharge home with return precautions. Final Clinical Impression(s) / ED Diagnoses Final diagnoses:  Migraine with aura and without status migrainosus, not intractable    Rx / DC Orders ED Discharge Orders         Ordered    US Venous Img Lower Unilateral Left        10/09/20 0456           Pricilla Loveless, MD 10/09/20 0505

## 2020-10-09 NOTE — ED Provider Notes (Signed)
  US Venous Img Lower Unilateral Left (DVT)  Result Date: 10/09/2020 CLINICAL DATA:  LEFT calf pain and a 30 year old female. EXAM: LEFT LOWER EXTREMITY VENOUS DOPPLER ULTRASOUND TECHNIQUE: Gray-scale sonography with compression, as well as color and duplex ultrasound, were performed to evaluate the deep venous system(s) from the level of the common femoral vein through the popliteal and proximal calf veins. COMPARISON:  None FINDINGS: VENOUS Normal compressibility of the common femoral, superficial femoral, and popliteal veins, as well as the visualized calf veins. Visualized portions of profunda femoral vein and great saphenous vein unremarkable. No filling defects to suggest DVT on grayscale or color Doppler imaging. Doppler waveforms show normal direction of venous flow, normal respiratory plasticity and response to augmentation. Limited views of the contralateral common femoral vein are unremarkable. OTHER None. Limitations: none IMPRESSION: Negative sonographic assessment of the LEFT lower extremity for DVT. Electronically Signed   By: Donzetta Kohut M.D.   On: 10/09/2020 17:22   CT VENOGRAM HEAD  Result Date: 10/09/2020 CLINICAL DATA:  History of migraines. Dural venous sinus thrombosis suspected. EXAM: CT VENOGRAM HEAD TECHNIQUE: Noncontrast head CT was first obtained. Following bolus administration of iodinated contrast a repeat CT was performed in the venous phase. CONTRAST:  18mL OMNIPAQUE IOHEXOL 350 MG/ML SOLN COMPARISON:  Head CT 07/04/2016 FINDINGS: Brain: Negative for infarct, hemorrhage, hydrocephalus, mass, or collection. Vascular: The dural sinuses are diffusely enhancing. No visible cortical vein thrombus. Deep veins are symmetrically enhancing. Unremarkable arterial structures. Skull: Negative for fracture or bone lesion. Sinuses and orbits: Clear sinuses and negative orbits. IMPRESSION: Negative study. No dural sinus thrombosis or other cause for headache. Electronically Signed   By:  Marnee Spring M.D.   On: 10/09/2020 04:34   DG Hip Unilat W or Wo Pelvis 2-3 Views Left  Result Date: 10/09/2020 CLINICAL DATA:  Left hip pain for 1 week EXAM: DG HIP (WITH OR WITHOUT PELVIS) 2-3V LEFT COMPARISON:  None. FINDINGS: There is no evidence of hip fracture or dislocation. There is no evidence of arthropathy or other focal bone abnormality. IMPRESSION: Negative. Electronically Signed   By: Marnee Spring M.D.   On: 10/09/2020 04:49   10/09/20 6:25 PM patient returned to have duplex ultrasound performed on left lower extremity.  Once this test resulted, I spoke with the patient to tell her about her results. She denies any worsening her symptoms.  We agreed she would follow-up with her PCP for any further evaluation or management.     Anselm Pancoast, PA-C 10/09/20 1834    Vanetta Mulders, MD 10/15/20 0800

## 2020-10-09 NOTE — Discharge Instructions (Signed)
If you develop continued, recurrent, or worsening headache, fever, neck stiffness, vomiting, blurry or double vision, weakness or numbness in your arms or legs, trouble speaking, or any other new/concerning symptoms then return to the ER for evaluation.   IMPORTANT PATIENT INSTRUCTIONS:  You have been scheduled for an Outpatient Ultrasound.    Your appointment has been scheduled for:  _______ am/pm on _______________ (date).  If your appointment is scheduled for a Saturday, Sunday or holiday, please go to the [facility] Emergency Department Registration Desk at least 15 minutes prior to your appointment time and tell them you are there for an ultrasound.    If your appointment is scheduled for a weekday (Monday - Friday), please go directly to the [facility] Radiology Department reception area at least 15 minutes prior to your appointment time and tell them you are there for an ultrasound.  Please call [phone] with questions.

## 2020-10-17 DIAGNOSIS — J4520 Mild intermittent asthma, uncomplicated: Secondary | ICD-10-CM | POA: Diagnosis not present

## 2020-10-17 DIAGNOSIS — J3089 Other allergic rhinitis: Secondary | ICD-10-CM | POA: Diagnosis not present

## 2020-10-17 DIAGNOSIS — J301 Allergic rhinitis due to pollen: Secondary | ICD-10-CM | POA: Diagnosis not present

## 2020-10-17 DIAGNOSIS — L2089 Other atopic dermatitis: Secondary | ICD-10-CM | POA: Diagnosis not present

## 2020-10-31 DIAGNOSIS — K2970 Gastritis, unspecified, without bleeding: Secondary | ICD-10-CM | POA: Diagnosis not present

## 2020-10-31 DIAGNOSIS — K200 Eosinophilic esophagitis: Secondary | ICD-10-CM | POA: Diagnosis not present

## 2020-10-31 DIAGNOSIS — K257 Chronic gastric ulcer without hemorrhage or perforation: Secondary | ICD-10-CM | POA: Diagnosis not present

## 2020-11-05 ENCOUNTER — Other Ambulatory Visit: Payer: Self-pay

## 2020-11-05 ENCOUNTER — Encounter: Payer: Self-pay | Admitting: Nurse Practitioner

## 2020-11-05 ENCOUNTER — Ambulatory Visit (INDEPENDENT_AMBULATORY_CARE_PROVIDER_SITE_OTHER): Payer: BC Managed Care – PPO | Admitting: Nurse Practitioner

## 2020-11-05 VITALS — BP 118/74

## 2020-11-05 DIAGNOSIS — R1032 Left lower quadrant pain: Secondary | ICD-10-CM

## 2020-11-05 DIAGNOSIS — R110 Nausea: Secondary | ICD-10-CM

## 2020-11-05 NOTE — Progress Notes (Signed)
   Acute Office Visit  Subjective:    Patient ID: Stefanie Lucero, female    DOB: [birth year], 29 y.o.   MRN: [id number]   HPI 30 y.o. presents today for left lower quadrant abdominal pain. She had mild LLQ pain with IUD, which was removed 10/02/2020 due to pain and abnormal uterine bleeding. She has not started Nuvaring yet because she wanted to see how her cycles would be without hormones. Pain has continued since IUD removal but has worsened over the last few weeks, is intermittent, sharp, and worse with lying on left side. She does have nausea that is not new for her but seems more frequent. She had an episode of vomiting 4 days ago at the start of her cycle. She is not sure if the nausea is associated with pain. LMP 11/02/2020. Is down 20 pounds with diet after being diagnosed with PUD and GERD a couple of months ago - managed by GI.    Review of Systems  Constitutional: Negative.  Negative for fever.  Gastrointestinal: Positive for abdominal pain, nausea and vomiting (x 1). Negative for abdominal distention, blood in stool, constipation and diarrhea.  Genitourinary: Negative.        Objective:    Physical Exam Constitutional:      Appearance: Normal appearance.  Abdominal:     General: There is no distension.     Palpations: Abdomen is soft. There is no mass.     Tenderness: There is abdominal tenderness in the left lower quadrant. There is guarding. There is no rebound.  Genitourinary:    General: Normal vulva.     Vagina: Normal.     Cervix: Normal.     Uterus: Normal.      BP 118/74   LMP 11/02/2020  Wt Readings from Last 3 Encounters:  10/08/20 201 lb 1 oz (91.2 kg)  07/28/20 201 lb (91.2 kg)  01/15/20 213 lb (96.6 kg)        Assessment & Plan:   Problem List Items Addressed This Visit   None   Visit Diagnoses    Left lower quadrant abdominal pain    -  Primary   Relevant Orders   US PELVIS TRANSVAGINAL NON-OB (TV ONLY)     Plan: We will schedule  ultrasound for further evaluation. We discussed possibility of ovarian cyst/torsion versus GI etiology such as diverticulitis. Instructed to go to ER if pain becomes severe or is accompanied by vomiting. If ultrasound normal she will follow up with GI. She is agreeable to plan.      Olivia Mackie DNP, 9:43 AM 11/05/2020

## 2020-11-07 ENCOUNTER — Emergency Department (HOSPITAL_BASED_OUTPATIENT_CLINIC_OR_DEPARTMENT_OTHER): Payer: BC Managed Care – PPO

## 2020-11-07 ENCOUNTER — Emergency Department (HOSPITAL_BASED_OUTPATIENT_CLINIC_OR_DEPARTMENT_OTHER)
Admission: EM | Admit: 2020-11-07 | Discharge: 2020-11-07 | Disposition: A | Discharge status: Home / Self Care | Payer: BC Managed Care – PPO | Attending: Emergency Medicine | Admitting: Emergency Medicine

## 2020-11-07 ENCOUNTER — Emergency Department (HOSPITAL_COMMUNITY): Payer: BC Managed Care – PPO

## 2020-11-07 ENCOUNTER — Other Ambulatory Visit: Payer: Self-pay

## 2020-11-07 ENCOUNTER — Encounter (HOSPITAL_BASED_OUTPATIENT_CLINIC_OR_DEPARTMENT_OTHER): Payer: Self-pay | Admitting: *Deleted

## 2020-11-07 DIAGNOSIS — R1031 Right lower quadrant pain: Secondary | ICD-10-CM | POA: Diagnosis not present

## 2020-11-07 DIAGNOSIS — R112 Nausea with vomiting, unspecified: Secondary | ICD-10-CM | POA: Diagnosis not present

## 2020-11-07 DIAGNOSIS — R1084 Generalized abdominal pain: Secondary | ICD-10-CM | POA: Diagnosis not present

## 2020-11-07 DIAGNOSIS — R110 Nausea: Secondary | ICD-10-CM | POA: Insufficient documentation

## 2020-11-07 DIAGNOSIS — S39012A Strain of muscle, fascia and tendon of lower back, initial encounter: Secondary | ICD-10-CM | POA: Diagnosis not present

## 2020-11-07 LAB — COMPREHENSIVE METABOLIC PANEL
ALT: 24 U/L (ref 0–44)
AST: 23 U/L (ref 15–41)
Albumin: 4.8 g/dL (ref 3.5–5.0)
Alkaline Phosphatase: 63 U/L (ref 38–126)
Anion gap: 11 (ref 5–15)
BUN: 9 mg/dL (ref 6–20)
CO2: 23 mmol/L (ref 22–32)
Calcium: 9.7 mg/dL (ref 8.9–10.3)
Chloride: 104 mmol/L (ref 98–111)
Creatinine, Ser: 0.75 mg/dL (ref 0.44–1.00)
GFR, Estimated: >60 mL/min (ref >60)
Glucose, Bld: 99 mg/dL (ref 70–99)
Potassium: 3.5 mmol/L (ref 3.5–5.1)
Sodium: 138 mmol/L (ref 135–145)
Total Bilirubin: 0.5 mg/dL (ref 0.3–1.2)
Total Protein: 8.1 g/dL (ref 6.5–8.1)

## 2020-11-07 LAB — PREGNANCY, URINE: Preg Test, Ur: NEGATIVE

## 2020-11-07 LAB — WET PREP, GENITAL
Clue Cells Wet Prep HPF POC: NONE SEEN
Sperm: NONE SEEN
Trich, Wet Prep: NONE SEEN
WBC, Wet Prep HPF POC: NONE SEEN
Yeast Wet Prep HPF POC: NONE SEEN

## 2020-11-07 LAB — CBC WITH DIFFERENTIAL/PLATELET
Abs Immature Granulocytes: 0.03 10*3/uL (ref 0.00–0.07)
Basophils Absolute: 0 10*3/uL (ref 0.0–0.1)
Basophils Relative: 0 %
Eosinophils Absolute: 0.1 10*3/uL (ref 0.0–0.5)
Eosinophils Relative: 1 %
HCT: 46.4 % — ABNORMAL HIGH (ref 36.0–46.0)
Hemoglobin: 15.3 g/dL — ABNORMAL HIGH (ref 12.0–15.0)
Immature Granulocytes: 0 %
Lymphocytes Relative: 26 %
Lymphs Abs: 2.3 10*3/uL (ref 0.7–4.0)
MCH: 29.7 pg (ref 26.0–34.0)
MCHC: 33 g/dL (ref 30.0–36.0)
MCV: 89.9 fL (ref 80.0–100.0)
Monocytes Absolute: 0.5 10*3/uL (ref 0.1–1.0)
Monocytes Relative: 6 %
Neutro Abs: 5.9 10*3/uL (ref 1.7–7.7)
Neutrophils Relative %: 67 %
Platelets: 295 10*3/uL (ref 150–400)
RBC: 5.16 MIL/uL — ABNORMAL HIGH (ref 3.87–5.11)
RDW: 12.8 % (ref 11.5–15.5)
WBC: 8.9 10*3/uL (ref 4.0–10.5)
nRBC: 0 % (ref 0.0–0.2)

## 2020-11-07 LAB — URINALYSIS, ROUTINE W REFLEX MICROSCOPIC
Bilirubin Urine: NEGATIVE
Glucose, UA: NEGATIVE mg/dL
Ketones, ur: NEGATIVE mg/dL
Leukocytes,Ua: NEGATIVE
Nitrite: NEGATIVE
Protein, ur: NEGATIVE mg/dL
Specific Gravity, Urine: <1.005 — ABNORMAL LOW (ref 1.005–1.030)
pH: 7 (ref 5.0–8.0)

## 2020-11-07 LAB — LIPASE, BLOOD: Lipase: 28 U/L (ref 11–51)

## 2020-11-07 LAB — URINALYSIS, MICROSCOPIC (REFLEX)

## 2020-11-07 MED ORDER — SODIUM CHLORIDE 0.9 % IV BOLUS
1000.0000 mL | Freq: Once | INTRAVENOUS | Status: AC
  Administered 2020-11-07: 1000 mL via INTRAVENOUS

## 2020-11-07 NOTE — ED Triage Notes (Addendum)
Abdominal and back pain. No fever. She was seen by her GYN 2 days ago for same and was scheduled for [place] next week. Negative pregnancy test. She was seen at [facility] before coming here and had a UA.

## 2020-11-07 NOTE — ED Provider Notes (Signed)
[facility] EMERGENCY DEPARTMENT Provider Note   CSN: [id number] Arrival date & time: 11/07/20  1643     History Chief Complaint  Patient presents with  . Abdominal Pain  . Back Pain    Stefanie Lucero is a 30 y.o. female with past medical history of migraines, anxiety, who presents today for evaluation of abdominal pain. She states that she was seen by her OB/GYN 2 days ago for abdominal pain.  Review of records show that it appears she had a UA and an unremarkable pelvic exam however no STI testing was performed.  She states that over the past 4 days her abdominal pain has worsened significantly.  She also reports significantly increased nausea.  She has a history of GERD and is compliant with her GERD medications.  She states that this is significantly worse than her usual nausea.  She denies any fevers. She is reportedly scheduled for an ultrasound next month by her OB/GYN however reports that her symptoms are worsening.  She has been unable to tolerate food for a few days due to nausea.   Of note patient has iodine listed as an allergy. She states that she is not allergic to iodine.  She had a contrasted CT scan last month and didn't get steroid premedications.  She did get Benadryl as part of a migraine cocktail however this was not as part of the premedication regimen. she states that it was added as an allergy after she had a panic attack when getting contrast. She denies hives, shortness of breath, facial swelling or other allergic type symptoms when getting contrast.  She requested I remove iodine from her allergy list.   HPI     Past Medical History:  Diagnosis Date  . Anxiety   . Migraine   . STD (sexually transmitted disease)    trichomonas treated 2020    Patient Active Problem List   Diagnosis Date Noted  . History of trichomoniasis 12/07/2019  . IUD (intrauterine device) in place 12/07/2019  . Intractable migraine without aura and with status  migrainosus 07/05/2016    Past Surgical History:  Procedure Laterality Date  . CESAREAN SECTION     x 2  . INTRAUTERINE DEVICE (IUD) INSERTION     mirena inserted 12-07-2019  . TONSILLECTOMY  2001     OB History    Gravida  2   Para  2   Term  2   Preterm      AB      Living  2     SAB      IAB      Ectopic      Multiple      Live Births  2           Family History  Problem Relation Age of Onset  . Hypertension Mother   . Heart attack Father        times 2  . Hypertension Father   . Alzheimer's disease Maternal Grandmother   . Irregular heart beat Maternal Grandmother   . Stroke Maternal Grandfather   . Gallbladder disease Maternal Grandfather   . Colon cancer Paternal Grandfather [id number]            Social History   Tobacco Use  . Smoking status: Never Smoker  . Smokeless tobacco: Never Used  Substance Use Topics  . Alcohol use: Not Currently    Comment: 1-2 every 2 weeks  . Drug use: No    Home  Medications Prior to Admission medications   Medication Sig Start Date End Date Taking? Authorizing Provider  omeprazole (PRILOSEC) 40 MG capsule Take 40 mg by mouth daily.   Yes [provider]  acetaminophen (TYLENOL) 325 MG tablet Take 650 mg by mouth every 6 (six) hours as needed.    [provider]  etonogestrel-ethinyl estradiol (NUVARING) 0.12-0.015 MG/24HR vaginal ring Place 1 each vaginally every 28 (twenty-eight) days. Insert vaginally and leave in place for 3 consecutive weeks, then remove for 1 week. Patient not taking: No sig reported 10/02/20   [name], NP  meclizine (ANTIVERT) 12.5 MG tablet Take 25 mg by mouth 2 (two) times daily as needed. Patient not taking: No sig reported 08/15/20   [provider]    Allergies    Ceftin [cefuroxime], Iodine, and Nickel  Review of Systems   Review of Systems  Constitutional: Negative for chills and fever.  HENT: Negative for drooling.   Eyes: Negative for  visual disturbance.  Respiratory: Negative for cough and shortness of breath.   Gastrointestinal: Positive for abdominal pain and nausea. Negative for constipation, diarrhea and vomiting.  Genitourinary: Positive for pelvic pain and vaginal bleeding. Negative for dysuria, frequency, vaginal discharge and vaginal pain.  Musculoskeletal: Positive for back pain. Negative for neck pain.  Skin: Negative for color change and rash.  Neurological: Negative for weakness and headaches.  Psychiatric/Behavioral: Negative for confusion.  All other systems reviewed and are negative.   Physical Exam Updated Vital Signs BP 116/80 (BP Location: Right Arm)   Pulse 72   Temp 98.5 F (36.9 C) (Oral)   Resp 16   Ht 5\' 3"  (1.6 m)   Wt 87.1 kg   LMP 11/02/2020   SpO2 100%   BMI 34.01 kg/m   Physical Exam Vitals and nursing note reviewed. Exam conducted with a chaperone present (Female tach).  Constitutional:      General: She is not in acute distress.    Appearance: She is not diaphoretic.  HENT:     Head: Normocephalic and atraumatic.  Eyes:     General: No scleral icterus.       Right eye: No discharge.        Left eye: No discharge.     Conjunctiva/sclera: Conjunctivae normal.  Cardiovascular:     Rate and Rhythm: Normal rate and regular rhythm.  Pulmonary:     Effort: Pulmonary effort is normal. No respiratory distress.     Breath sounds: No stridor.  Abdominal:     General: Bowel sounds are normal. There is no distension.     Palpations: Abdomen is soft.     Tenderness: There is abdominal tenderness in the right lower quadrant, suprapubic area and left lower quadrant.     Hernia: No hernia is present.  Genitourinary:    Comments: Normal external female genitalia.  There is a scant amount of blood in the vaginal canal without active bleeding.  No adnexal tenderness or fullness.  No cervical motion tenderness. Musculoskeletal:        General: No deformity.     Cervical back: Normal  range of motion.  Skin:    General: Skin is warm and dry.  Neurological:     General: No focal deficit present.     Mental Status: She is alert.     Cranial Nerves: No cranial nerve deficit.     Motor: No abnormal muscle tone.  Psychiatric:        Mood and Affect:  Mood normal.        Behavior: Behavior normal.     ED Results / Procedures / Treatments   Labs (all labs ordered are listed, but only abnormal results are displayed) Labs Reviewed  CBC WITH DIFFERENTIAL/PLATELET - Abnormal; Notable for the following components:      Result Value   RBC 5.16 (*)    Hemoglobin 15.3 (*)    HCT 46.4 (*)    All other components within normal limits  URINALYSIS, ROUTINE W REFLEX MICROSCOPIC - Abnormal; Notable for the following components:   Specific Gravity, Urine <1.005 (*)    Hgb urine dipstick SMALL (*)    All other components within normal limits  URINALYSIS, MICROSCOPIC (REFLEX) - Abnormal; Notable for the following components:   Bacteria, UA FEW (*)    All other components within normal limits  WET PREP, GENITAL  COMPREHENSIVE METABOLIC PANEL  LIPASE, BLOOD  PREGNANCY, URINE  GC/CHLAMYDIA PROBE AMP ([place]) NOT AT [facility]    EKG None  Radiology CT ABDOMEN PELVIS WO CONTRAST  Result Date: 11/07/2020 CLINICAL DATA:  Right lower quadrant pain EXAM: CT ABDOMEN AND PELVIS WITHOUT CONTRAST TECHNIQUE: Multidetector CT imaging of the abdomen and pelvis was performed following the standard protocol without IV contrast. COMPARISON:  02/03/2019 FINDINGS: Lower chest: No acute abnormality. Hepatobiliary: No focal hepatic abnormality. Gallbladder unremarkable. Pancreas: No focal abnormality or ductal dilatation. Spleen: This No focal abnormality.  Normal size. Adrenals/Urinary Tract: No adrenal abnormality. No focal renal abnormality. No stones or hydronephrosis. Urinary bladder is unremarkable. Stomach/Bowel: Stomach, large and small bowel grossly unremarkable. Normal appendix.  Vascular/Lymphatic: No evidence of aneurysm or adenopathy. Reproductive: Uterus and adnexa unremarkable.  No mass. Other: No free fluid or free air. Musculoskeletal: No acute bony abnormality. IMPRESSION: Normal appendix. No renal or ureteral stones.  No hydronephrosis. No acute findings. Electronically Signed   By: Charlett Nose M.D.   On: 11/07/2020 20:08    Procedures Procedures   Medications Ordered in ED Medications  sodium chloride 0.9 % bolus 1,000 mL (0 mLs Intravenous Stopped 11/07/20 2041)    ED Course  I have reviewed the triage vital signs and the nursing notes.  Pertinent labs & imaging results that were available during my care of the patient were reviewed by me and considered in my medical decision making (see chart for details).  Clinical Course as of 11/07/20 2315  Fri Nov 07, 2020  1949 Spoke with CT tech, given that patient did have Benadryl prior to her contrasted CT scan last time, with the iodine as an allergy on her chart the options are either no contrast, p.o. only contrast or IV contrast after a 4-hour premedication. For now will obtain Noncon CT scan due to nausea I do not think she would tolerate p.o. contrast. [EH]  2027 CT ABDOMEN PELVIS WO CONTRAST No abnormalities acutely noted. [EH]    Clinical Course User Index [EH] Norman Clay   MDM Rules/Calculators/A&P                         Patient is a 30 year old woman who presents today for evaluation of nausea and abdominal pain. Chart review shows she was seen by OB/GYN 2 days ago however I do not see evidence of STI testing. Her physical exam is not consistent with PID as she does not have cervical motion tenderness.  Her abdomen is tender in primarily the right lower quadrant and she still has her appendix,  given this along with her worsening and nausea and worsening in pain over the past 4 days CT abdomen pelvis without contrast due to allergy is obtained.  This does not show appendicitis, or  other cause for patient's symptoms. She has Zofran at home.  Was offered p.o. challenge, but stated that she wished to go home instead. Her wet prep was unremarkable, GC testing is sent.  UA does show few bacteria however she is not having significant urinary symptoms. CMP is unremarkable, pregnancy test is negative, CBC does show slightly elevated hemoglobin at 15.3, she states that this is consistent with her normal. She was treated with IV fluids while in the emergency room due to her reported poor p.o. intake after which she felt better.  Return precautions were discussed with patient who states their understanding.  At the time of discharge patient denied any unaddressed complaints or concerns.  Patient is agreeable for discharge home.  Note: Portions of this report may have been transcribed using voice recognition software. Every effort was made to ensure accuracy; however, inadvertent computerized transcription errors may be present  Final Clinical Impression(s) / ED Diagnoses Final diagnoses:  Generalized abdominal pain  Nausea    Rx / DC Orders ED Discharge Orders    None       Cristina Gong, PA-C 11/07/20 2317    Melene Plan, DO 11/09/20 1029

## 2020-11-07 NOTE — ED Notes (Signed)
Pt verbalizes understanding of discharge instructions. Opportunity for questioning and answers were provided. Armand removed by staff, pt discharged from ED ambulatory to Home.   

## 2020-11-07 NOTE — Discharge Instructions (Signed)
Please follow up with your OB/GYN, and primary care doctor. If you develop fevers, worsening symptoms, the Zofran is not allowing you to eat and drink or you have other concerns please seek additional medical care and evaluation.

## 2020-11-10 ENCOUNTER — Ambulatory Visit: Payer: Self-pay

## 2020-11-10 LAB — GC/CHLAMYDIA PROBE AMP (~~LOC~~) NOT AT ARMC
Chlamydia: NEGATIVE
Comment: NEGATIVE
Comment: NORMAL
Neisseria Gonorrhea: NEGATIVE

## 2020-11-11 ENCOUNTER — Ambulatory Visit: Payer: Self-pay

## 2020-11-12 ENCOUNTER — Other Ambulatory Visit: Payer: Self-pay

## 2020-11-12 ENCOUNTER — Ambulatory Visit
Admission: RE | Admit: 2020-11-12 | Discharge: 2020-11-12 | Disposition: A | Discharge status: Home / Self Care | Payer: BC Managed Care – PPO | Source: Ambulatory Visit | Attending: Emergency Medicine | Admitting: Emergency Medicine

## 2020-11-12 VITALS — BP 120/81 | HR 95 | Temp 98.3°F | Resp 18

## 2020-11-12 DIAGNOSIS — K2100 Gastro-esophageal reflux disease with esophagitis, without bleeding: Secondary | ICD-10-CM

## 2020-11-12 HISTORY — DX: Eosinophilic esophagitis: K20.0

## 2020-11-12 MED ORDER — ALUM & MAG HYDROXIDE-SIMETH 200-200-20 MG/5ML PO SUSP
30.0000 mL | Freq: Once | ORAL | Status: AC
  Administered 2020-11-12: 30 mL via ORAL

## 2020-11-12 MED ORDER — ALUM & MAG HYDROXIDE-SIMETH 400-400-40 MG/5ML PO SUSP: 15.0000 mL | Freq: Four times a day (QID) | ORAL | 0 refills | Status: DC | PRN

## 2020-11-12 MED ORDER — LIDOCAINE VISCOUS HCL 2 % MT SOLN
15.0000 mL | Freq: Once | OROMUCOSAL | Status: AC
  Administered 2020-11-12: 15 mL via ORAL

## 2020-11-12 MED ORDER — FAMOTIDINE 20 MG PO TABS: 20.0000 mg | ORAL_TABLET | Freq: Two times a day (BID) | ORAL | 0 refills | Status: DC

## 2020-11-12 NOTE — ED Triage Notes (Signed)
Pt sts worsening heart burn sx and trouble swallowing since 5 days after having upper endoscopy on 4/8; pt sts hx of similar issues; pt sts taking omeprazole but not helping currently; worse at night when laying down

## 2020-11-12 NOTE — ED Provider Notes (Signed)
[facility]    CSN: [id number] Arrival date & time: 11/12/20  1137      History   Chief Complaint Chief Complaint  Patient presents with  . Appointment    1200  . Abdominal Pain    HPI Stefanie Lucero is a 30 y.o. female history of migraines, presenting today for evaluation of sore throat/chest congestion/GERD.  Reports over the past 4 days has had discomfort in her chest as well as sore throat.  Describes a burning sensation.  Feels this is similar to underlying GERD.  Taking omeprazole 40 mg twice daily and Tums without relief.  Had EGD on 4/8-reports history of PUD and eosinophilic esophagitis, symptoms did not start immediately.  Denies any significant URI symptoms of congestion or cough, has had sore throat.  HPI  Past Medical History:  Diagnosis Date  . Anxiety   . Eosinophilic esophagitis   . Migraine   . STD (sexually transmitted disease)    trichomonas treated 2020    Patient Active Problem List   Diagnosis Date Noted  . History of trichomoniasis 12/07/2019  . IUD (intrauterine device) in place 12/07/2019  . 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 CESAREAN SECTION     x 2  . INTRAUTERINE DEVICE (IUD) INSERTION     mirena inserted 12-07-2019  . TONSILLECTOMY  2001    OB History    Gravida  2   Para  2   Term  2   Preterm      AB      Living  2     SAB      IAB      Ectopic      Multiple      Live Births  2            Home Medications    Prior to Admission medications   Medication Sig Start Date End Date Taking? Authorizing Provider  alum & mag hydroxide-simeth (MAALOX MULTI SYMPTOM MAX ST) 400-400-40 MG/5ML suspension Take 15 mLs by mouth every 6 (six) hours as needed for indigestion. 11/12/20  Yes Gray Doering C, PA-C  famotidine (PEPCID) 20 MG tablet Take 1 tablet (20 mg total) by mouth 2 (two) times daily. 11/12/20  Yes  Korie Brabson C, PA-C  acetaminophen (TYLENOL) 325 MG tablet Take 650 mg by mouth every 6 (six) hours as needed.    [provider]  omeprazole (PRILOSEC) 40 MG capsule Take 40 mg by mouth daily.    [provider]  etonogestrel-ethinyl estradiol (NUVARING) 0.12-0.015 MG/24HR vaginal ring Place 1 each vaginally every 28 (twenty-eight) days. Insert vaginally and leave in place for 3 consecutive weeks, then remove for 1 week. Patient not taking: No sig reported 10/02/20 11/12/20  Olivia Mackie, NP    Family History Family History  Problem Relation Age of Onset  . Hypertension Mother   . Heart attack Father        times 2  . Hypertension Father   . Alzheimer's disease Maternal Grandmother   . Irregular heart beat Maternal Grandmother   . Stroke Maternal Grandfather   . Gallbladder disease Maternal Grandfather   . Colon cancer Paternal Grandfather 42            Social History Social History   Tobacco Use  . Smoking status: Never Smoker  . Smokeless tobacco: Never Used  Substance Use Topics  .  Alcohol use: Not Currently    Comment: 1-2 every 2 weeks  . Drug use: No     Allergies   Ceftin [cefuroxime], Iodine, and Nickel   Review of Systems Review of Systems  Constitutional: Negative for activity change, appetite change, chills, fatigue and fever.  HENT: Positive for sore throat. Negative for congestion, ear pain, rhinorrhea, sinus pressure and trouble swallowing.   Eyes: Negative for discharge and redness.  Respiratory: Positive for cough. Negative for chest tightness and shortness of breath.   Cardiovascular: Positive for chest pain.  Gastrointestinal: Positive for abdominal pain. Negative for diarrhea, nausea and vomiting.  Musculoskeletal: Negative for myalgias.  Skin: Negative for rash.  Neurological: Negative for dizziness, light-headedness and headaches.     Physical Exam Triage Vital Signs ED Triage Vitals  Enc Vitals Group     BP       Pulse      Resp      Temp      Temp src      SpO2      Weight      Height      Head Circumference      Peak Flow      Pain Score      Pain Loc      Pain Edu?      Excl. in GC?    No data found.  Updated Vital Signs BP 120/81 (BP Location: Left Arm)   Pulse 95   Temp 98.3 F (36.8 C) (Oral)   Resp 18   LMP 11/02/2020   SpO2 98%   Visual Acuity Right Eye Distance:   Left Eye Distance:   Bilateral Distance:    Right Eye Near:   Left Eye Near:    Bilateral Near:     Physical Exam Vitals and nursing note reviewed.  Constitutional:      Appearance: She is well-developed.     Comments: No acute distress  HENT:     Head: Normocephalic and atraumatic.     Ears:     Comments: Bilateral ears without tenderness to palpation of external auricle, tragus and mastoid, EAC's without erythema or swelling, TM's with good bony landmarks and cone of light. Non erythematous.     Nose: Nose normal.     Mouth/Throat:     Comments: Oral mucosa pink and moist, no tonsillar enlargement or exudate. Posterior pharynx patent and nonerythematous, no uvula deviation or swelling. Normal phonation. Eyes:     Conjunctiva/sclera: Conjunctivae normal.  Cardiovascular:     Rate and Rhythm: Normal rate.  Pulmonary:     Effort: Pulmonary effort is normal. No respiratory distress.     Comments: Breathing comfortably at rest, CTABL, no wheezing, rales or other adventitious sounds auscultated Abdominal:     General: There is no distension.  Musculoskeletal:        General: Normal range of motion.     Cervical back: Neck supple.  Skin:    General: Skin is warm and dry.  Neurological:     Mental Status: She is alert and oriented to person, place, and time.      UC Treatments / Results  Labs (all labs ordered are listed, but only abnormal results are displayed) Labs Reviewed - No data to display  EKG   Radiology No results found.  Procedures Procedures (including critical care  time)  Medications Ordered in UC Medications  alum & mag hydroxide-simeth (MAALOX/MYLANTA) 200-200-20 MG/5ML suspension 30 mL (30 mLs Oral Given 11/12/20  1228)    And  lidocaine (XYLOCAINE) 2 % viscous mouth solution 15 mL (15 mLs Oral Given 11/12/20 1228)    Initial Impression / Assessment and Plan / UC Course  I have reviewed the triage vital signs and the nursing notes.  Pertinent labs & imaging results that were available during my care of the patient were reviewed by me and considered in my medical decision making (see chart for details).    GI cocktail provided with some improvement and discomfort in chest/abdomen, will continue to supplement with Maalox or Pepcid, continue PPI, avoid trigger foods, monitor for continued return to baseline.  Discussed strict return precautions. Patient verbalized understanding and is agreeable with plan.   Final Clinical Impressions(s) / UC Diagnoses   Final diagnoses:  Gastroesophageal reflux disease with esophagitis without hemorrhage     Discharge Instructions     Please use Maalox or Pepcid as needed for further indigestion relief in combination with continuing omeprazole Avoid trigger foods-see attached Follow-up if not improving or worsening    ED Prescriptions    Medication Sig Dispense Auth. Provider   alum & mag hydroxide-simeth (MAALOX MULTI SYMPTOM MAX ST) 400-400-40 MG/5ML suspension Take 15 mLs by mouth every 6 (six) hours as needed for indigestion. 355 mL Zareah Hunzeker C, PA-C   famotidine (PEPCID) 20 MG tablet Take 1 tablet (20 mg total) by mouth 2 (two) times daily. 30 tablet Waverley Krempasky, [place] C, PA-C     PDMP not reviewed this encounter.   Lew Dawes, PA-C 11/12/20 1253

## 2020-11-12 NOTE — Discharge Instructions (Signed)
Please use Maalox or Pepcid as needed for further indigestion relief in combination with continuing omeprazole Avoid trigger foods-see attached Follow-up if not improving or worsening

## 2020-11-17 ENCOUNTER — Ambulatory Visit: Payer: BC Managed Care – PPO | Admitting: Nurse Practitioner

## 2020-11-29 DIAGNOSIS — R6884 Jaw pain: Secondary | ICD-10-CM | POA: Diagnosis not present

## 2020-11-29 DIAGNOSIS — K0889 Other specified disorders of teeth and supporting structures: Secondary | ICD-10-CM | POA: Insufficient documentation

## 2020-11-30 ENCOUNTER — Other Ambulatory Visit: Payer: Self-pay

## 2020-11-30 ENCOUNTER — Encounter (HOSPITAL_COMMUNITY): Payer: Self-pay | Admitting: Emergency Medicine

## 2020-11-30 ENCOUNTER — Emergency Department (HOSPITAL_COMMUNITY)
Admission: EM | Admit: 2020-11-30 | Discharge: 2020-11-30 | Disposition: A | Discharge status: Home / Self Care | Payer: BC Managed Care – PPO | Attending: Emergency Medicine | Admitting: Emergency Medicine

## 2020-11-30 DIAGNOSIS — K0889 Other specified disorders of teeth and supporting structures: Secondary | ICD-10-CM

## 2020-11-30 MED ORDER — PENICILLIN V POTASSIUM 500 MG PO TABS
500.0000 mg | ORAL_TABLET | Freq: Once | ORAL | Status: AC
  Administered 2020-11-30: 500 mg via ORAL
  Filled 2020-11-30: qty 1

## 2020-11-30 MED ORDER — PENICILLIN V POTASSIUM 500 MG PO TABS: 500.0000 mg | ORAL_TABLET | Freq: Three times a day (TID) | ORAL | 0 refills | Status: DC

## 2020-11-30 MED ORDER — LIDOCAINE VISCOUS HCL 2 % MT SOLN: 15.0000 mL | OROMUCOSAL | 0 refills | Status: DC | PRN

## 2020-11-30 NOTE — ED Provider Notes (Signed)
[facility]-EMERGENCY DEPT Provider Note   CSN: [id number] Arrival date & time: 11/29/20  2359     History Chief Complaint  Patient presents with  . Jaw Pain    Stefanie Lucero is a 30 y.o. female.  The history is provided by the patient and medical records.    29 year old female with history of anxiety, migraine headaches, presenting to the ED for right-sided dental pain.  States it has been ongoing since around 5 AM this morning.  States she feels it sometimes in her upper jaw, sometimes lower.  Pain has intensified throughout the day.  She has been able to eat and drink.  No difficulty swallowing.  She denies any fevers or facial swelling.  States she is not sure if it is from her tooth or her actual jaw.  She has tried taking multiple over-the-counter medications as well as oxycodone without relief.  Past Medical History:  Diagnosis Date  . Anxiety   . Eosinophilic esophagitis   . Migraine   . STD (sexually transmitted disease)    trichomonas treated 2020    Patient Active Problem List   Diagnosis Date Noted  . History of trichomoniasis 12/07/2019  . IUD (intrauterine device) in place 12/07/2019  . 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 CESAREAN SECTION     x 2  . INTRAUTERINE DEVICE (IUD) INSERTION     mirena inserted 12-07-2019  . TONSILLECTOMY  2001     OB History    Gravida  2   Para  2   Term  2   Preterm      AB      Living  2     SAB      IAB      Ectopic      Multiple      Live Births  2           Family History  Problem Relation Age of Onset  . Hypertension Mother   . Heart attack Father        times 2  . Hypertension Father   . Alzheimer's disease Maternal Grandmother   . Irregular heart beat Maternal Grandmother   . Stroke Maternal Grandfather   . Gallbladder disease Maternal Grandfather   . Colon cancer  Paternal Grandfather 66            Social History   Tobacco Use  . Smoking status: Never Smoker  . Smokeless tobacco: Never Used  Substance Use Topics  . Alcohol use: Not Currently    Comment: 1-2 every 2 weeks  . Drug use: No    Home Medications Prior to Admission medications   Medication Sig Start Date End Date Taking? Authorizing Provider  acetaminophen (TYLENOL) 325 MG tablet Take 650 mg by mouth every 6 (six) hours as needed.    [provider]  alum & mag hydroxide-simeth (MAALOX MULTI SYMPTOM MAX ST) 400-400-40 MG/5ML suspension Take 15 mLs by mouth every 6 (six) hours as needed for indigestion. 11/12/20   Wieters, Hallie C, PA-C  famotidine (PEPCID) 20 MG tablet Take 1 tablet (20 mg total) by mouth 2 (two) times daily. 11/12/20   Wieters, Hallie C, PA-C  omeprazole (PRILOSEC) 40 MG capsule Take 40 mg by mouth daily.    [provider]  etonogestrel-ethinyl estradiol (NUVARING) 0.12-0.015 MG/24HR vaginal ring Place 1 each vaginally every 28 (twenty-eight) days. Insert  vaginally and leave in place for 3 consecutive weeks, then remove for 1 week. Patient not taking: No sig reported 10/02/20 11/12/20  Olivia Mackie, NP    Allergies    Ceftin [cefuroxime], Iodine, and Nickel  Review of Systems   Review of Systems  HENT: Positive for dental problem.   All other systems reviewed and are negative.   Physical Exam Updated Vital Signs BP 115/87 (BP Location: Left Arm)   Pulse 89   Temp 98 F (36.7 C) (Oral)   Resp 18   LMP 11/02/2020   SpO2 99%   Physical Exam Vitals and nursing note reviewed.  Constitutional:      Appearance: She is well-developed.  HENT:     Head: Normocephalic and atraumatic.     Comments: Teeth largely in fair dentition, broken right upper molar, lower right 2nd and 3rd molars have cavities present, surrounding gingiva without swelling or discrete abscess but is tender to touch, handling secretions appropriately, no trismus or  malocclusion, no tenderness along TMJ, no facial or neck swelling, normal phonation without stridor Eyes:     Conjunctiva/sclera: Conjunctivae normal.     Pupils: Pupils are equal, round, and reactive to light.  Cardiovascular:     Rate and Rhythm: Normal rate and regular rhythm.     Heart sounds: Normal heart sounds.  Pulmonary:     Effort: Pulmonary effort is normal.     Breath sounds: Normal breath sounds.  Abdominal:     General: Bowel sounds are normal.     Palpations: Abdomen is soft.  Musculoskeletal:        General: Normal range of motion.     Cervical back: Normal range of motion.  Skin:    General: Skin is warm and dry.  Neurological:     Mental Status: She is alert and oriented to person, place, and time.     ED Results / Procedures / Treatments   Labs (all labs ordered are listed, but only abnormal results are displayed) Labs Reviewed - No data to display  EKG None  Radiology No results found.  Procedures Procedures   Medications Ordered in ED Medications  penicillin v potassium (VEETID) tablet 500 mg (has no administration in time range)    ED Course  I have reviewed the triage vital signs and the nursing notes.  Pertinent labs & imaging results that were available during my care of the patient were reviewed by me and considered in my medical decision making (see chart for details).    MDM Rules/Calculators/A&P  30 year old female here with right-sided jaw/dental pain since 5 AM this morning.  On exam she is able to open and close her mouth without elicited pain, there is no tenderness along the TMJ.  There is no trismus or malocclusion.  She does have cavities noted to the lower right molars as well as a broken right upper molar.  She does have tenderness along the lower gums without discrete abscess or fluid collection.  She has no facial or neck swelling, handling secretions well.  No drooling or stridor.  Symptoms seem dental in nature.  There are no  concerning findings for Ludwig's angina.  Will start on antibiotics and refer to dentist for follow-up.  She may return here for any new or acute changes.  Final Clinical Impression(s) / ED Diagnoses Final diagnoses:  Pain, dental    Rx / DC Orders ED Discharge Orders         Ordered  penicillin v potassium (VEETID) 500 MG tablet  3 times daily        11/30/20 0026    lidocaine (XYLOCAINE) 2 % solution  As needed        11/30/20 0026           Garlon Hatchet, PA-C 11/30/20 0030    Sabas Sous, MD 11/30/20 [phone]

## 2020-11-30 NOTE — ED Triage Notes (Signed)
Patient arrives complaining of right jaw pain. Patient states she has attempted multiple over the counter remedies without success. Patient unsure if dental in origin.

## 2020-11-30 NOTE — Discharge Instructions (Signed)
Take the prescribed medication as directed. Follow-up with Dr. Mia Creek, he is our dentist on call.  Try and call his office first thing Monday to get an appointment. Have attached list of other clinics in the area if needed. Return to the ED for new or worsening symptoms.

## 2020-12-03 ENCOUNTER — Other Ambulatory Visit: Payer: BC Managed Care – PPO | Admitting: Obstetrics & Gynecology

## 2020-12-03 ENCOUNTER — Other Ambulatory Visit: Payer: BC Managed Care – PPO

## 2020-12-03 DIAGNOSIS — Z0289 Encounter for other administrative examinations: Secondary | ICD-10-CM

## 2020-12-22 ENCOUNTER — Emergency Department (HOSPITAL_COMMUNITY)
Admission: EM | Admit: 2020-12-22 | Discharge: 2020-12-22 | Disposition: A | Discharge status: Home / Self Care | Payer: BC Managed Care – PPO | Attending: Emergency Medicine | Admitting: Emergency Medicine

## 2020-12-22 ENCOUNTER — Other Ambulatory Visit: Payer: Self-pay

## 2020-12-22 ENCOUNTER — Emergency Department (HOSPITAL_COMMUNITY): Payer: BC Managed Care – PPO

## 2020-12-22 ENCOUNTER — Encounter (HOSPITAL_COMMUNITY): Payer: Self-pay | Admitting: Emergency Medicine

## 2020-12-22 DIAGNOSIS — R6883 Chills (without fever): Secondary | ICD-10-CM | POA: Diagnosis not present

## 2020-12-22 DIAGNOSIS — R079 Chest pain, unspecified: Secondary | ICD-10-CM | POA: Diagnosis not present

## 2020-12-22 DIAGNOSIS — R0789 Other chest pain: Secondary | ICD-10-CM | POA: Diagnosis not present

## 2020-12-22 DIAGNOSIS — R197 Diarrhea, unspecified: Secondary | ICD-10-CM | POA: Insufficient documentation

## 2020-12-22 DIAGNOSIS — M791 Myalgia, unspecified site: Secondary | ICD-10-CM | POA: Diagnosis not present

## 2020-12-22 DIAGNOSIS — R1013 Epigastric pain: Secondary | ICD-10-CM | POA: Insufficient documentation

## 2020-12-22 DIAGNOSIS — R112 Nausea with vomiting, unspecified: Secondary | ICD-10-CM | POA: Insufficient documentation

## 2020-12-22 LAB — CBC
HCT: 44.4 % (ref 36.0–46.0)
Hemoglobin: 14.5 g/dL (ref 12.0–15.0)
MCH: 29.7 pg (ref 26.0–34.0)
MCHC: 32.7 g/dL (ref 30.0–36.0)
MCV: 90.8 fL (ref 80.0–100.0)
Platelets: 312 10*3/uL (ref 150–400)
RBC: 4.89 MIL/uL (ref 3.87–5.11)
RDW: 12.9 % (ref 11.5–15.5)
WBC: 9.7 10*3/uL (ref 4.0–10.5)
nRBC: 0 % (ref 0.0–0.2)

## 2020-12-22 LAB — BASIC METABOLIC PANEL
Anion gap: 7 (ref 5–15)
BUN: 10 mg/dL (ref 6–20)
CO2: 27 mmol/L (ref 22–32)
Calcium: 9.3 mg/dL (ref 8.9–10.3)
Chloride: 104 mmol/L (ref 98–111)
Creatinine, Ser: 0.74 mg/dL (ref 0.44–1.00)
GFR, Estimated: >60 mL/min (ref >60)
Glucose, Bld: 100 mg/dL — ABNORMAL HIGH (ref 70–99)
Potassium: 4 mmol/L (ref 3.5–5.1)
Sodium: 138 mmol/L (ref 135–145)

## 2020-12-22 LAB — HEPATIC FUNCTION PANEL
ALT: 21 U/L (ref 0–44)
AST: 20 U/L (ref 15–41)
Albumin: 4.6 g/dL (ref 3.5–5.0)
Alkaline Phosphatase: 70 U/L (ref 38–126)
Bilirubin, Direct: <0.1 mg/dL (ref 0.0–0.2)
Total Bilirubin: 0.4 mg/dL (ref 0.3–1.2)
Total Protein: 8 g/dL (ref 6.5–8.1)

## 2020-12-22 LAB — LIPASE, BLOOD: Lipase: 30 U/L (ref 11–51)

## 2020-12-22 LAB — TROPONIN I (HIGH SENSITIVITY): Troponin I (High Sensitivity): <2 ng/L (ref <18)

## 2020-12-22 LAB — I-STAT BETA HCG BLOOD, ED (MC, WL, AP ONLY): I-stat hCG, quantitative: <5 m[IU]/mL (ref <5)

## 2020-12-22 MED ORDER — SODIUM CHLORIDE 0.9 % IV BOLUS
1000.0000 mL | Freq: Once | INTRAVENOUS | Status: AC
  Administered 2020-12-22: 1000 mL via INTRAVENOUS

## 2020-12-22 MED ORDER — ONDANSETRON HCL 4 MG PO TABS: 4.0000 mg | ORAL_TABLET | Freq: Three times a day (TID) | ORAL | 0 refills | Status: DC | PRN

## 2020-12-22 MED ORDER — ONDANSETRON HCL 4 MG/2ML IJ SOLN
4.0000 mg | Freq: Once | INTRAMUSCULAR | Status: AC
  Administered 2020-12-22: 4 mg via INTRAVENOUS
  Filled 2020-12-22: qty 2

## 2020-12-22 NOTE — ED Provider Notes (Signed)
[facility]-EMERGENCY DEPT Provider Note   CSN: [id number] Arrival date & time: 12/22/20  1611     History Chief Complaint  Patient presents with  . Chest Pain  . Diarrhea  . Emesis    Stefanie Lucero is a 30 y.o. female.  The history is provided by the patient.  Diarrhea Quality:  Watery Severity:  Mild Onset quality:  Gradual Progression:  Improving Relieved by:  Nothing Worsened by:  Nothing Associated symptoms: abdominal pain, chills, myalgias and vomiting   Associated symptoms: no arthralgias and no fever   Emesis Associated symptoms: abdominal pain, chills, diarrhea and myalgias   Associated symptoms: no arthralgias, no cough, no fever and no sore throat        Past Medical History:  Diagnosis Date  . Anxiety   . Eosinophilic esophagitis   . Migraine   . STD (sexually transmitted disease)    trichomonas treated 2020    Patient Active Problem List   Diagnosis Date Noted  . History of trichomoniasis 12/07/2019  . IUD (intrauterine device) in place 12/07/2019  . 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 CESAREAN SECTION     x 2  . INTRAUTERINE DEVICE (IUD) INSERTION     mirena inserted 12-07-2019  . TONSILLECTOMY  2001     OB History    Gravida  2   Para  2   Term  2   Preterm      AB      Living  2     SAB      IAB      Ectopic      Multiple      Live Births  2           Family History  Problem Relation Age of Onset  . Hypertension Mother   . Heart attack Father        times 2  . Hypertension Father   . Alzheimer's disease Maternal Grandmother   . Irregular heart beat Maternal Grandmother   . Stroke Maternal Grandfather   . Gallbladder disease Maternal Grandfather   . Colon cancer Paternal Grandfather 50            Social History   Tobacco Use  . Smoking status: Never Smoker  . Smokeless tobacco: Never  Used  Substance Use Topics  . Alcohol use: Not Currently    Comment: 1-2 every 2 weeks  . Drug use: No    Home Medications Prior to Admission medications   Medication Sig Start Date End Date Taking? Authorizing Provider  ondansetron (ZOFRAN) 4 MG tablet Take 1 tablet (4 mg total) by mouth every 8 (eight) hours as needed for up to 10 doses for nausea or vomiting. 12/22/20  Yes Shatonia Hoots, DO  acetaminophen (TYLENOL) 325 MG tablet Take 650 mg by mouth every 6 (six) hours as needed.    [provider]  alum & mag hydroxide-simeth (MAALOX MULTI SYMPTOM MAX ST) 400-400-40 MG/5ML suspension Take 15 mLs by mouth every 6 (six) hours as needed for indigestion. 11/12/20   Wieters, Hallie C, PA-C  famotidine (PEPCID) 20 MG tablet Take 1 tablet (20 mg total) by mouth 2 (two) times daily. 11/12/20   Wieters, Hallie C, PA-C  lidocaine (XYLOCAINE) 2 % solution Use as directed 15 mLs in the mouth or throat as needed for mouth pain. 11/30/20   Garlon Hatchet,  PA-C  omeprazole (PRILOSEC) 40 MG capsule Take 40 mg by mouth daily.    [provider]  penicillin v potassium (VEETID) 500 MG tablet Take 1 tablet (500 mg total) by mouth 3 (three) times daily. 11/30/20   Garlon Hatchet, PA-C  etonogestrel-ethinyl estradiol (NUVARING) 0.12-0.015 MG/24HR vaginal ring Place 1 each vaginally every 28 (twenty-eight) days. Insert vaginally and leave in place for 3 consecutive weeks, then remove for 1 week. Patient not taking: No sig reported 10/02/20 11/12/20  Olivia Mackie, NP    Allergies    Ceftin [cefuroxime], Iodine, and Nickel  Review of Systems   Review of Systems  Constitutional: Positive for chills. Negative for fever.  HENT: Negative for ear pain and sore throat.   Eyes: Negative for pain and visual disturbance.  Respiratory: Negative for cough and shortness of breath.   Cardiovascular: Positive for chest pain (heartburn like). Negative for palpitations.  Gastrointestinal: Positive for  abdominal pain, diarrhea, nausea and vomiting.  Genitourinary: Negative for dysuria and hematuria.  Musculoskeletal: Positive for myalgias. Negative for arthralgias and back pain.  Skin: Negative for color change and rash.  Neurological: Negative for seizures and syncope.  All other systems reviewed and are negative.   Physical Exam Updated Vital Signs  ED Triage Vitals  Enc Vitals Group     BP 12/22/20 1618 (!) 126/94     Pulse Rate 12/22/20 1618 96     Resp 12/22/20 1618 15     Temp 12/22/20 1618 98.2 F (36.8 C)     Temp Source 12/22/20 1618 Oral     SpO2 12/22/20 1618 97 %     Weight 12/22/20 1620 185 lb (83.9 kg)     Height 12/22/20 1620 5\' 3"  (1.6 m)     Head Circumference --      Peak Flow --      Pain Score 12/22/20 1618 8     Pain Loc --      Pain Edu? --      Excl. in GC? --     Physical Exam Vitals and nursing note reviewed.  Constitutional:      General: She is not in acute distress.    Appearance: She is well-developed.  HENT:     Head: Normocephalic and atraumatic.  Eyes:     Extraocular Movements: Extraocular movements intact.     Conjunctiva/sclera: Conjunctivae normal.     Pupils: Pupils are equal, round, and reactive to light.  Cardiovascular:     Rate and Rhythm: Normal rate and regular rhythm.     Pulses:          Radial pulses are 2+ on the right side and 2+ on the left side.       Dorsalis pedis pulses are 2+ on the right side and 2+ on the left side.     Heart sounds: No murmur heard.   Pulmonary:     Effort: Pulmonary effort is normal. No respiratory distress.     Breath sounds: Normal breath sounds. No decreased breath sounds or wheezing.  Abdominal:     Palpations: Abdomen is soft.     Tenderness: There is abdominal tenderness (epigastric).  Musculoskeletal:        General: Normal range of motion.     Cervical back: Normal range of motion and neck supple.  Skin:    General: Skin is warm and dry.     Capillary Refill: Capillary  refill takes less than 2 seconds.  Neurological:  General: No focal deficit present.     Mental Status: She is alert.  Psychiatric:        Mood and Affect: Mood normal.     ED Results / Procedures / Treatments   Labs (all labs ordered are listed, but only abnormal results are displayed) Labs Reviewed  BASIC METABOLIC PANEL - Abnormal; Notable for the following components:      Result Value   Glucose, Bld 100 (*)    All other components within normal limits  CBC  HEPATIC FUNCTION PANEL  LIPASE, BLOOD  I-STAT BETA HCG BLOOD, ED ([facility], [facility], [facility] ONLY)  TROPONIN I (HIGH SENSITIVITY)    EKG EKG Interpretation  Date/Time:  Monday Dec 22 2020 16:18:24 EDT Ventricular Rate:  95 PR Interval:  156 QRS Duration: 61 QT Interval:  437 QTC Calculation: 550 R Axis:   52 Text Interpretation: Sinus rhythm Confirmed by Virgina Norfolk ([id number]) on 12/22/2020 4:35:26 PM   Radiology No results found.  Procedures Procedures   Medications Ordered in ED Medications  sodium chloride 0.9 % bolus 1,000 mL (0 mLs Intravenous Stopped 12/22/20 1804)  ondansetron (ZOFRAN) injection 4 mg (4 mg Intravenous Given 12/22/20 1700)    ED Course  I have reviewed the triage vital signs and the nursing notes.  Pertinent labs & imaging results that were available during my care of the patient were reviewed by me and considered in my medical decision making (see chart for details).    MDM Rules/Calculators/A&P                          [name] is here with nausea, vomiting, diarrhea.  The last several days.  Normal vitals.  No fever.  Having some body aches now that nausea vomiting diarrhea has improved.  Having some heartburn symptoms.  Some epigastric abdominal pain.  No melena or hematochezia.  Lab work showed no significant anemia, electrolyte [place]del[place], kidney injury.  Lab work not consistent with cholecystitis or pancreatitis as well as exam.  Chest pain likely reflux related.  Troponin normal.   EKG shows sinus rhythm.  Given reassurance.  Overall suspect resolving viral process.  Felt better after IV fluids and Zofran and discharged in ED in good condition.   This chart was dictated using voice recognition software.  Despite best efforts to proofread,  errors can occur which can change the documentation meaning.    Final Clinical Impression(s) / ED Diagnoses Final diagnoses:  Nausea vomiting and diarrhea    Rx / DC Orders ED Discharge Orders         Ordered    ondansetron (ZOFRAN) 4 MG tablet  Every 8 hours PRN        12/22/20 1912           Virgina Norfolk, DO 12/22/20 1913

## 2020-12-22 NOTE — ED Notes (Signed)
To radiology

## 2020-12-22 NOTE — ED Triage Notes (Signed)
Pt states she has chest pain generalized through her whole chest and back. Pt also states she has had diarrhea since Friday morning. Pt states that her chest pain also began on Friday morning and that it has come on gradually. Pt states that she has had nausea and vomiting since Friday morning as well. Pt has hx of GI problems.

## 2020-12-23 DIAGNOSIS — K200 Eosinophilic esophagitis: Secondary | ICD-10-CM | POA: Diagnosis not present

## 2020-12-23 DIAGNOSIS — K219 Gastro-esophageal reflux disease without esophagitis: Secondary | ICD-10-CM | POA: Diagnosis not present

## 2020-12-23 DIAGNOSIS — F419 Anxiety disorder, unspecified: Secondary | ICD-10-CM | POA: Diagnosis not present

## 2021-01-09 DIAGNOSIS — K200 Eosinophilic esophagitis: Secondary | ICD-10-CM | POA: Diagnosis not present

## 2021-01-09 DIAGNOSIS — K2970 Gastritis, unspecified, without bleeding: Secondary | ICD-10-CM | POA: Diagnosis not present

## 2021-01-09 DIAGNOSIS — K219 Gastro-esophageal reflux disease without esophagitis: Secondary | ICD-10-CM | POA: Diagnosis not present

## 2021-01-09 DIAGNOSIS — K257 Chronic gastric ulcer without hemorrhage or perforation: Secondary | ICD-10-CM | POA: Diagnosis not present

## 2021-01-25 ENCOUNTER — Encounter (HOSPITAL_COMMUNITY): Payer: Self-pay | Admitting: Emergency Medicine

## 2021-01-25 ENCOUNTER — Emergency Department (HOSPITAL_COMMUNITY)
Admission: EM | Admit: 2021-01-25 | Discharge: 2021-01-26 | Disposition: A | Discharge status: Home / Self Care | Payer: BC Managed Care – PPO | Attending: Emergency Medicine | Admitting: Emergency Medicine

## 2021-01-25 ENCOUNTER — Other Ambulatory Visit: Payer: Self-pay

## 2021-01-25 DIAGNOSIS — Z28310 Unvaccinated for covid-19: Secondary | ICD-10-CM | POA: Insufficient documentation

## 2021-01-25 DIAGNOSIS — R9431 Abnormal electrocardiogram [ECG] [EKG]: Secondary | ICD-10-CM | POA: Diagnosis not present

## 2021-01-25 DIAGNOSIS — K760 Fatty (change of) liver, not elsewhere classified: Secondary | ICD-10-CM | POA: Diagnosis not present

## 2021-01-25 DIAGNOSIS — R109 Unspecified abdominal pain: Secondary | ICD-10-CM | POA: Diagnosis not present

## 2021-01-25 DIAGNOSIS — R0602 Shortness of breath: Secondary | ICD-10-CM | POA: Insufficient documentation

## 2021-01-25 DIAGNOSIS — M25511 Pain in right shoulder: Secondary | ICD-10-CM | POA: Insufficient documentation

## 2021-01-25 DIAGNOSIS — R1111 Vomiting without nausea: Secondary | ICD-10-CM | POA: Diagnosis not present

## 2021-01-25 DIAGNOSIS — Z20822 Contact with and (suspected) exposure to covid-19: Secondary | ICD-10-CM | POA: Diagnosis not present

## 2021-01-25 DIAGNOSIS — K529 Noninfective gastroenteritis and colitis, unspecified: Secondary | ICD-10-CM | POA: Insufficient documentation

## 2021-01-25 DIAGNOSIS — R1011 Right upper quadrant pain: Secondary | ICD-10-CM | POA: Insufficient documentation

## 2021-01-25 NOTE — ED Triage Notes (Addendum)
Pt reports pain in her R upper back and shoulder and abdominal pain x 2 days. Reports a long gastrointestinal history. Does still have her gallbladder. Endorses nausea and vomiting. Also reports feeling like her heart is racing.

## 2021-01-26 ENCOUNTER — Encounter (HOSPITAL_BASED_OUTPATIENT_CLINIC_OR_DEPARTMENT_OTHER): Payer: Self-pay | Admitting: *Deleted

## 2021-01-26 ENCOUNTER — Emergency Department (HOSPITAL_BASED_OUTPATIENT_CLINIC_OR_DEPARTMENT_OTHER)
Admission: EM | Admit: 2021-01-26 | Discharge: 2021-01-26 | Disposition: A | Discharge status: Home / Self Care | Payer: BC Managed Care – PPO | Source: Home / Self Care | Attending: Emergency Medicine | Admitting: Emergency Medicine

## 2021-01-26 ENCOUNTER — Encounter (HOSPITAL_COMMUNITY): Payer: Self-pay | Admitting: Emergency Medicine

## 2021-01-26 ENCOUNTER — Other Ambulatory Visit: Payer: Self-pay

## 2021-01-26 ENCOUNTER — Emergency Department (HOSPITAL_COMMUNITY): Payer: BC Managed Care – PPO

## 2021-01-26 ENCOUNTER — Emergency Department (HOSPITAL_BASED_OUTPATIENT_CLINIC_OR_DEPARTMENT_OTHER): Payer: BC Managed Care – PPO

## 2021-01-26 DIAGNOSIS — K760 Fatty (change of) liver, not elsewhere classified: Secondary | ICD-10-CM | POA: Diagnosis not present

## 2021-01-26 DIAGNOSIS — R109 Unspecified abdominal pain: Secondary | ICD-10-CM | POA: Diagnosis not present

## 2021-01-26 DIAGNOSIS — K529 Noninfective gastroenteritis and colitis, unspecified: Secondary | ICD-10-CM

## 2021-01-26 DIAGNOSIS — R0602 Shortness of breath: Secondary | ICD-10-CM | POA: Insufficient documentation

## 2021-01-26 DIAGNOSIS — R1111 Vomiting without nausea: Secondary | ICD-10-CM | POA: Diagnosis not present

## 2021-01-26 DIAGNOSIS — Z28310 Unvaccinated for covid-19: Secondary | ICD-10-CM | POA: Insufficient documentation

## 2021-01-26 DIAGNOSIS — Z20822 Contact with and (suspected) exposure to covid-19: Secondary | ICD-10-CM | POA: Insufficient documentation

## 2021-01-26 LAB — COMPREHENSIVE METABOLIC PANEL
ALT: 22 U/L (ref 0–44)
AST: 23 U/L (ref 15–41)
Albumin: 4.4 g/dL (ref 3.5–5.0)
Alkaline Phosphatase: 66 U/L (ref 38–126)
Anion gap: 8 (ref 5–15)
BUN: 7 mg/dL (ref 6–20)
CO2: 23 mmol/L (ref 22–32)
Calcium: 8.8 mg/dL — ABNORMAL LOW (ref 8.9–10.3)
Chloride: 108 mmol/L (ref 98–111)
Creatinine, Ser: 0.65 mg/dL (ref 0.44–1.00)
GFR, Estimated: >60 mL/min (ref >60)
Glucose, Bld: 90 mg/dL (ref 70–99)
Potassium: 3.6 mmol/L (ref 3.5–5.1)
Sodium: 139 mmol/L (ref 135–145)
Total Bilirubin: 0.5 mg/dL (ref 0.3–1.2)
Total Protein: 7.7 g/dL (ref 6.5–8.1)

## 2021-01-26 LAB — CBC WITH DIFFERENTIAL/PLATELET
Abs Immature Granulocytes: 0.05 K/uL (ref 0.00–0.07)
Abs Immature Granulocytes: 0.02 K/uL (ref 0.00–0.07)
Basophils Absolute: 0 K/uL (ref 0.0–0.1)
Basophils Absolute: 0 K/uL (ref 0.0–0.1)
Basophils Relative: 0 %
Basophils Relative: 0 %
Eosinophils Absolute: 0.1 K/uL (ref 0.0–0.5)
Eosinophils Absolute: 0 K/uL (ref 0.0–0.5)
Eosinophils Relative: 1 %
Eosinophils Relative: 0 %
HCT: 43.2 % (ref 36.0–46.0)
HCT: 41.5 % (ref 36.0–46.0)
Hemoglobin: 14.2 g/dL (ref 12.0–15.0)
Hemoglobin: 13.7 g/dL (ref 12.0–15.0)
Immature Granulocytes: 0 %
Immature Granulocytes: 0 %
Lymphocytes Relative: 14 %
Lymphocytes Relative: 15 %
Lymphs Abs: 1.7 K/uL (ref 0.7–4.0)
Lymphs Abs: 1 K/uL (ref 0.7–4.0)
MCH: 29.2 pg (ref 26.0–34.0)
MCH: 29.3 pg (ref 26.0–34.0)
MCHC: 32.9 g/dL (ref 30.0–36.0)
MCHC: 33 g/dL (ref 30.0–36.0)
MCV: 88.9 fL (ref 80.0–100.0)
MCV: 88.7 fL (ref 80.0–100.0)
Monocytes Absolute: 0.9 K/uL (ref 0.1–1.0)
Monocytes Absolute: 0.6 K/uL (ref 0.1–1.0)
Monocytes Relative: 7 %
Monocytes Relative: 9 %
Neutro Abs: 10 K/uL — ABNORMAL HIGH (ref 1.7–7.7)
Neutro Abs: 4.7 K/uL (ref 1.7–7.7)
Neutrophils Relative %: 78 %
Neutrophils Relative %: 76 %
Platelets: 256 K/uL (ref 150–400)
Platelets: 231 K/uL (ref 150–400)
RBC: 4.86 MIL/uL (ref 3.87–5.11)
RBC: 4.68 MIL/uL (ref 3.87–5.11)
RDW: 12.7 % (ref 11.5–15.5)
RDW: 12.7 % (ref 11.5–15.5)
WBC: 12.7 K/uL — ABNORMAL HIGH (ref 4.0–10.5)
WBC: 6.3 K/uL (ref 4.0–10.5)
nRBC: 0 % (ref 0.0–0.2)
nRBC: 0 % (ref 0.0–0.2)

## 2021-01-26 LAB — I-STAT CHEM 8, ED
BUN: 7 mg/dL (ref 6–20)
Calcium, Ion: 1.18 mmol/L (ref 1.15–1.40)
Chloride: 105 mmol/L (ref 98–111)
Creatinine, Ser: 0.6 mg/dL (ref 0.44–1.00)
Glucose, Bld: 103 mg/dL — ABNORMAL HIGH (ref 70–99)
HCT: 44 % (ref 36.0–46.0)
Hemoglobin: 15 g/dL (ref 12.0–15.0)
Potassium: 3.6 mmol/L (ref 3.5–5.1)
Sodium: 141 mmol/L (ref 135–145)
TCO2: 24 mmol/L (ref 22–32)

## 2021-01-26 LAB — RESP PANEL BY RT-PCR (FLU A&B, COVID) ARPGX2
Influenza A by PCR: NEGATIVE
Influenza B by PCR: NEGATIVE
SARS Coronavirus 2 by RT PCR: NEGATIVE

## 2021-01-26 LAB — I-STAT BETA HCG BLOOD, ED (MC, WL, AP ONLY): I-stat hCG, quantitative: <5 m[IU]/mL

## 2021-01-26 LAB — URINALYSIS, ROUTINE W REFLEX MICROSCOPIC
Bilirubin Urine: NEGATIVE
Bilirubin Urine: NEGATIVE
Glucose, UA: NEGATIVE mg/dL
Glucose, UA: NEGATIVE mg/dL
Hgb urine dipstick: NEGATIVE
Ketones, ur: 20 mg/dL — AB
Ketones, ur: NEGATIVE mg/dL
Leukocytes,Ua: NEGATIVE
Nitrite: NEGATIVE
Nitrite: NEGATIVE
Protein, ur: NEGATIVE mg/dL
Protein, ur: NEGATIVE mg/dL
Specific Gravity, Urine: 1.01 (ref 1.005–1.030)
Specific Gravity, Urine: <1.005 — ABNORMAL LOW (ref 1.005–1.030)
pH: 7 (ref 5.0–8.0)
pH: 7 (ref 5.0–8.0)

## 2021-01-26 LAB — URINALYSIS, MICROSCOPIC (REFLEX)

## 2021-01-26 LAB — COMPREHENSIVE METABOLIC PANEL WITH GFR
ALT: 20 U/L (ref 0–44)
AST: 24 U/L (ref 15–41)
Albumin: 4.3 g/dL (ref 3.5–5.0)
Alkaline Phosphatase: 62 U/L (ref 38–126)
Anion gap: 9 (ref 5–15)
BUN: 9 mg/dL (ref 6–20)
CO2: 24 mmol/L (ref 22–32)
Calcium: 8.9 mg/dL (ref 8.9–10.3)
Chloride: 105 mmol/L (ref 98–111)
Creatinine, Ser: 0.67 mg/dL (ref 0.44–1.00)
GFR, Estimated: >60 mL/min
Glucose, Bld: 101 mg/dL — ABNORMAL HIGH (ref 70–99)
Potassium: 3.7 mmol/L (ref 3.5–5.1)
Sodium: 138 mmol/L (ref 135–145)
Total Bilirubin: 0.7 mg/dL (ref 0.3–1.2)
Total Protein: 7.5 g/dL (ref 6.5–8.1)

## 2021-01-26 LAB — D-DIMER, QUANTITATIVE: D-Dimer, Quant: 0.31 ug{FEU}/mL (ref 0.00–0.50)

## 2021-01-26 MED ORDER — KETOROLAC TROMETHAMINE 15 MG/ML IJ SOLN
15.0000 mg | Freq: Once | INTRAMUSCULAR | Status: AC
  Administered 2021-01-26: 15 mg via INTRAVENOUS
  Filled 2021-01-26: qty 1

## 2021-01-26 MED ORDER — KETOROLAC TROMETHAMINE 30 MG/ML IJ SOLN
15.0000 mg | Freq: Once | INTRAMUSCULAR | Status: AC
  Administered 2021-01-26: 15 mg via INTRAVENOUS
  Filled 2021-01-26: qty 1

## 2021-01-26 MED ORDER — SODIUM CHLORIDE 0.9 % IV BOLUS
500.0000 mL | Freq: Once | INTRAVENOUS | Status: AC
  Administered 2021-01-26: 500 mL via INTRAVENOUS

## 2021-01-26 MED ORDER — ALUM & MAG HYDROXIDE-SIMETH 200-200-20 MG/5ML PO SUSP
30.0000 mL | Freq: Once | ORAL | Status: AC
  Administered 2021-01-26: 30 mL via ORAL
  Filled 2021-01-26: qty 30

## 2021-01-26 MED ORDER — SODIUM BICARBONATE 8.4 % IV SOLN
50.0000 meq | Freq: Once | INTRAVENOUS | Status: AC
  Administered 2021-01-26: 50 meq via INTRAVENOUS
  Filled 2021-01-26: qty 50

## 2021-01-26 MED ORDER — ONDANSETRON HCL 4 MG/2ML IJ SOLN
4.0000 mg | Freq: Once | INTRAMUSCULAR | Status: AC
  Administered 2021-01-26: 4 mg via INTRAVENOUS
  Filled 2021-01-26: qty 2

## 2021-01-26 MED ORDER — ONDANSETRON 4 MG PO TBDP: 4.0000 mg | ORAL_TABLET | Freq: Three times a day (TID) | ORAL | 0 refills | Status: DC | PRN

## 2021-01-26 MED ORDER — DICYCLOMINE HCL 20 MG PO TABS: 20.0000 mg | ORAL_TABLET | Freq: Two times a day (BID) | ORAL | 0 refills | Status: DC

## 2021-01-26 NOTE — ED Provider Notes (Signed)
[facility] EMERGENCY DEPARTMENT Provider Note   CSN: [id number] Arrival date & time: 01/26/21  1301     History Chief Complaint  Patient presents with   Abdominal Pain    Stefanie Lucero is a 30 y.o. female who was seen in the emergency department last night with right upper abdominal pain with associated shortness of breath.  Had normal work-up including negative D-dimer and right upper quadrant ultrasound.  Return to the ER today with new fever of 101.8 F maximally at home, nausea, vomiting with NBNB emesis, and 2 episodes of clay colored stool.  Pain has now migrated down into the lower abdomen across both sides and is crampy in nature.  Patient denies any chest pain, palpitations, shortness of breath at this time, though she does endorse increased shortness of breath with severity of pain.  Personally reviewed this patient's medical records.  She is history of eosinophilic esophagitis and migraines.  She is on famotidine and Carafate.  Of note patient was seen in [facility] emergency department last night for right upper quadrant pain with associated shortness of breath.  She had reassuring work-up including negative D-dimer, and right upper quadrant ultrasound that revealed diffuse fatty infiltration of the liver but without any evidence of cholelithiasis or cholecystitis.  Common bile duct was normal with a 2 mm diameter.  There is no transaminitis, no evidence of pancreatitis based on laboratory studies, normal bilirubin, and normal CBC.  Patient was diagnosed with IBS flare given her history of IBS and discharged home.  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 07/05/2016    Past Surgical History:  Procedure Laterality Date   CESAREAN SECTION     x 2   INTRAUTERINE DEVICE (IUD) INSERTION     mirena inserted 12-07-2019   TONSILLECTOMY  2001     OB History     Gravida  2   Para  2   Term  2   Preterm      AB      Living  2      SAB      IAB      Ectopic      Multiple      Live Births  2           Family History  Problem Relation Age of Onset   Hypertension Mother    Heart attack Father        times 2   Hypertension Father    Alzheimer's disease Maternal Grandmother    Irregular heart beat Maternal Grandmother    Stroke Maternal Grandfather    Gallbladder disease Maternal Grandfather    Colon cancer Paternal Grandfather 3            Social History   Tobacco Use   Smoking status: Never   Smokeless tobacco: Never  Substance Use Topics   Alcohol use: Not Currently    Comment: 1-2 every 2 weeks   Drug use: No    Home Medications Prior to Admission medications   Medication Sig Start Date End Date Taking? Authorizing Provider  ondansetron [facility]  ODT) 4 MG disintegrating tablet Take 1 tablet (4 mg total) by mouth every 8 (eight) hours as needed for nausea or vomiting. 01/26/21  Yes Shabrea Weldin, Eugene Gavia, PA-C  acetaminophen (TYLENOL) 500 MG tablet Take 1,000 mg by mouth every 6 (six) hours as needed.    [provider]  albuterol (VENTOLIN HFA) 108 (90 Base) MCG/ACT inhaler Inhale 2 puffs into the lungs every 4 (four) hours as needed. 11/06/20   [provider]  dicyclomine (BENTYL) 20 MG tablet Take 1 tablet (20 mg total) by mouth 2 (two) times daily. 01/26/21   Palumbo, April, MD  famotidine (PEPCID) 20 MG tablet Take 1 tablet (20 mg total) by mouth 2 (two) times daily. 11/12/20   Wieters, Hallie C, PA-C  meclizine (ANTIVERT) 25 MG tablet Take 25 mg by mouth daily.    [provider]  sucralfate (CARAFATE) 1 g tablet Take 1 g by mouth 4 (four) times daily.     [provider]  etonogestrel-ethinyl estradiol (NUVARING) 0.12-0.015 MG/24HR vaginal ring Place 1 each vaginally every 28 (twenty-eight) days. Insert vaginally and leave in place for 3 consecutive weeks, then remove for 1 week. Patient not taking: No sig reported 10/02/20 11/12/20  [name], NP    Allergies    Eggs or egg-derived products, Nutmeg oil (myristica oil), Ceftin [cefuroxime], Guggulipid-black pepper, Iodine, and Nickel  Review of Systems   Review of Systems  Constitutional:  Positive for appetite change, chills, diaphoresis, fatigue and fever. Negative for activity change.  HENT: Negative.    Respiratory:  Positive for shortness of breath. Negative for apnea, cough, choking, chest tightness, wheezing and stridor.   Cardiovascular:  Positive for palpitations. Negative for chest pain and leg swelling.  Gastrointestinal:  Positive for abdominal pain, nausea and vomiting. Negative for anal bleeding, blood in stool, constipation, diarrhea and rectal pain.       Clay colored stool  Genitourinary:  Positive for vaginal discharge. Negative for decreased urine volume, difficulty urinating, dyspareunia, dysuria, enuresis, flank pain, frequency, genital sores, hematuria, menstrual problem, pelvic pain, urgency, vaginal bleeding and vaginal pain.  Musculoskeletal: Negative.   Skin: Negative.   Allergic/Immunologic: Negative for immunocompromised state.  Neurological: Negative.    Physical Exam Updated Vital Signs BP (!) 120/97 (BP Location: Left Arm)   Pulse (!) 112   Temp 98.6 F (37 C) (Oral)   Resp 18   Ht 5\' 3"  (1.6 m)   Wt 83.9 kg   LMP 01/14/2021   SpO2 99%   BMI 32.77 kg/m   Physical Exam Vitals and nursing note reviewed.  Constitutional:      Appearance: She is obese. She is not ill-appearing or toxic-appearing.  HENT:     Head: Normocephalic and atraumatic.     Nose: Nose normal.     Mouth/Throat:     Mouth: Mucous membranes are moist.      Pharynx: Oropharynx is clear. Uvula midline. No oropharyngeal exudate, posterior oropharyngeal erythema or uvula swelling.     Tonsils: No tonsillar exudate.  Eyes:     General: Lids are normal. Vision grossly intact.        Right eye: No discharge.        Left eye: No discharge.     Extraocular Movements: Extraocular movements intact.     Conjunctiva/sclera: Conjunctivae normal.     Pupils: Pupils are equal, round, and reactive to light.  Neck:     Trachea: Trachea and phonation normal.  Cardiovascular:  Rate and Rhythm: Normal rate and regular rhythm.     Pulses: Normal pulses.     Heart sounds: Normal heart sounds. No murmur heard. Pulmonary:     Effort: Pulmonary effort is normal. No tachypnea, bradypnea, accessory muscle usage, prolonged expiration or respiratory distress.     Breath sounds: Normal breath sounds. No wheezing or rales.  Chest:     Chest wall: No mass, lacerations, deformity, swelling, tenderness, crepitus or edema.  Abdominal:     General: Bowel sounds are normal. There is no distension.     Palpations: Abdomen is soft.     Tenderness: There is generalized abdominal tenderness and tenderness in the right upper quadrant, right lower quadrant, suprapubic area and left lower quadrant. There is no right CVA tenderness, left CVA tenderness, guarding or rebound. Positive signs include Murphy's sign. Negative signs include McBurney's sign.  Musculoskeletal:        General: No deformity.     Cervical back: Normal range of motion and neck supple. No edema, rigidity or crepitus. No pain with movement, spinous process tenderness or muscular tenderness.     Right lower leg: No edema.     Left lower leg: No edema.  Lymphadenopathy:     Cervical: No cervical adenopathy.  Skin:    General: Skin is warm and dry.  Neurological:     Mental Status: She is alert and oriented to person, place, and time. Mental status is at baseline.  Psychiatric:        Mood and Affect: Mood  normal.    ED Results / Procedures / Treatments   Labs (all labs ordered are listed, but only abnormal results are displayed) Labs Reviewed  COMPREHENSIVE METABOLIC PANEL - Abnormal; Notable for the following components:      Result Value   Calcium 8.8 (*)    All other components within normal limits  URINALYSIS, ROUTINE W REFLEX MICROSCOPIC - Abnormal; Notable for the following components:   Specific Gravity, Urine <1.005 (*)    Hgb urine dipstick TRACE (*)    All other components within normal limits  URINALYSIS, MICROSCOPIC (REFLEX) - Abnormal; Notable for the following components:   Bacteria, UA RARE (*)    All other components within normal limits  RESP PANEL BY RT-PCR (FLU A&B, COVID) ARPGX2  CBC WITH DIFFERENTIAL/PLATELET    EKG None  Radiology CT ABDOMEN PELVIS WO CONTRAST  Result Date: 01/26/2021 CLINICAL DATA:  Nonlocalized acute abdominal pain. Vomiting. Colace colored stools. EXAM: CT ABDOMEN AND PELVIS WITHOUT CONTRAST TECHNIQUE: Multidetector CT imaging of the abdomen and pelvis was performed following the standard protocol without IV contrast. COMPARISON:  CT abdomen pelvis 11/07/2020. CT renal 02/03/2019, CT abdomen pelvis 01/28/2015 FINDINGS: Lower chest: No acute abnormality. Hepatobiliary: No focal liver abnormality. No gallstones, gallbladder wall thickening, or pericholecystic fluid. No biliary dilatation. Pancreas: Similar-appearing fatty atrophy of the proximal pancreas. Otherwise no focal lesion. Normal pancreatic contour. No surrounding inflammatory changes. No main pancreatic ductal dilatation. Spleen: Normal in size without focal abnormality. Adrenals/Urinary Tract: No adrenal nodule bilaterally. No nephrolithiasis, no hydronephrosis, and no contour-deforming renal mass. No ureterolithiasis or hydroureter. The urinary bladder is unremarkable. Stomach/Bowel: Stomach is within normal limits. No evidence of bowel wall thickening or dilatation. Appendix appears  normal. Vascular/Lymphatic: No abdominal aorta or iliac aneurysm. No abdominal, pelvic, or inguinal lymphadenopathy. Reproductive: Uterus and bilateral adnexa are unremarkable. Other: No intraperitoneal free fluid. No intraperitoneal free gas. No organized fluid collection. Musculoskeletal: No abdominal wall hernia or abnormality. No  suspicious lytic or blastic osseous lesions. No acute displaced fracture. IMPRESSION: 1. No acute intra-abdominal or intrapelvic abnormality on this noncontrast study. 2. Similar-appearing fatty atrophy of the proximal pancreas. Electronically Signed   By: Tish Frederickson M.D.   On: 01/26/2021 15:17   US Abdomen Limited  Result Date: 01/26/2021 CLINICAL DATA:  Right upper quadrant pain for 4 days EXAM: ULTRASOUND ABDOMEN LIMITED RIGHT UPPER QUADRANT COMPARISON:  None. FINDINGS: Gallbladder: No gallstones or wall thickening visualized. No sonographic Murphy sign noted by sonographer. Common bile duct: Diameter: 2 mm, normal Liver: Diffusely increased hepatic parenchymal echotexture suggesting fatty infiltration. No focal lesions identified. Portal vein is patent on color Doppler imaging with normal direction of blood flow towards the liver. Other: None. IMPRESSION: Diffuse fatty infiltration of the liver. No evidence of cholelithiasis or cholecystitis. Electronically Signed   By: Burman Nieves M.D.   On: 01/26/2021 01:55    Procedures Procedures   Medications Ordered in ED Medications  ondansetron (ZOFRAN) injection 4 mg (4 mg Intravenous Given 01/26/21 1509)  ketorolac (TORADOL) 15 MG/ML injection 15 mg (15 mg Intravenous Given 01/26/21 1509)    ED Course  I have reviewed the triage vital signs and the nursing notes.  Pertinent labs & imaging results that were available during my care of the patient were reviewed by me and considered in my medical decision making (see chart for details).    MDM Rules/Calculators/A&P                         30 year old female  presents with worsening abdominal pain, new fevers, nausea, vomiting, and clay colored stools since yesterday. Not vaccinated against COVID-19.  Differential diagnosis includes vomited to cholecystitis, choledocholithiasis, biliary obstruction, mesenteric ischemia, infectious colitis, diverticulitis, acute viral gastroenteritis, bowel obstruction.  Tachycardic on intake, vital signs otherwise normal.  Afebrile.  Cardiopulmonary exam is normal, abdominal exam generally tender to palpation worse in the right upper quadrant and the lower quadrants bilaterally without rebound or guarding.  Increased bowel sounds.  CBC unremarkable at this time, CMP unremarkable, UA with trace hemoglobin and rare bacteria.  CT abdomen pelvis performed without contrast given patient has had reaction to contrast in the past and not tolerating p.o. given nausea.  CT negative for acute intra-abdominal or intrapelvic abnormality.  Stable fatty atrophy of the proximal pancreas.  Patient reevaluated after ministration of analgesia and antiemetic, feeling much improved.  Work-up both from ER visit this morning and visit this afternoon are very reassuring.  HPI most consistent with acute viral gastroenteritis.  We will test for COVID-19 and influenza A/B, and discharged home with Zofran as needed. May follow results in the mychart app. Was previously prescribed Bentyl at her visit this morning.  No further work-up warranted in ED this time given reassuring physical exam, vital signs, and work-up.  Stefanie Lucero voiced understanding of her medical evaluation and treatment plan.  Each of her questions answered to her expressed satisfaction.  Return precautions are given.  Patient is stable and appropriate for discharge at this time.  This chart was dictated using voice recognition software, Dragon. Despite the best efforts of this provider to proofread and correct errors, errors may still occur which can change documentation  meaning.  Final Clinical Impression(s) / ED Diagnoses Final diagnoses:  Gastroenteritis    Rx / DC Orders ED Discharge Orders          Ordered    ondansetron (ZOFRAN ODT) 4 MG disintegrating tablet  Every 8 hours PRN        01/26/21 1548             Adron Geisel, Eugene GaviaRebekah R, PA-C 01/26/21 1551    Alvira MondaySchlossman, Erin, MD 01/27/21 [phone]

## 2021-01-26 NOTE — ED Provider Notes (Addendum)
[facility]-EMERGENCY DEPT Provider Note   CSN: [id number] Arrival date & time: 01/25/21  2241     History Chief Complaint  Patient presents with   Shoulder Pain   Abdominal Pain    Stefanie Lucero is a 30 y.o. female.  The history is provided by the patient.  Abdominal Pain Pain location:  RUQ (radiates to R shoulder) Pain quality: cramping   Pain radiates to:  R shoulder Pain severity:  Moderate Onset quality:  Gradual Duration:  1 week Timing:  Constant Progression:  Waxing and waning Chronicity:  New Context: not alcohol use, not awakening from sleep, not diet changes, not eating and not laxative use   Relieved by:  Nothing Worsened by:  Nothing Ineffective treatments:  None tried Associated symptoms: no chills, no shortness of breath and no sore throat   Risk factors: no alcohol abuse   Risk factors comment:  Has IBS     Past Medical History:  Diagnosis Date   Anxiety    Eosinophilic esophagitis    Migraine    STD (sexually transmitted disease)    trichomonas treated 2020    Patient Active Problem List   Diagnosis Date Noted   History of trichomoniasis 12/07/2019   IUD (intrauterine device) in place 12/07/2019   Intractable migraine 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 07/05/2016    Past Surgical History:  Procedure Laterality Date   CESAREAN SECTION     x 2   INTRAUTERINE DEVICE (IUD) INSERTION     mirena inserted 12-07-2019   TONSILLECTOMY  2001     OB History     Gravida  2   Para  2   Term  2   Preterm      AB      Living  2      SAB      IAB      Ectopic      Multiple      Live Births  2           Family History  Problem Relation Age of Onset   Hypertension Mother    Heart attack Father        times 2   Hypertension Father    Alzheimer's disease Maternal Grandmother    Irregular heart beat Maternal Grandmother    Stroke Maternal Grandfather    Gallbladder disease Maternal Grandfather     Colon cancer Paternal Grandfather 55            Social History   Tobacco Use   Smoking status: Never   Smokeless tobacco: Never  Substance Use Topics   Alcohol use: Not Currently    Comment: 1-2 every 2 weeks   Drug use: No    Home Medications Prior to Admission medications   Medication Sig Start Date End Date Taking? Authorizing Provider  acetaminophen (TYLENOL) 500 MG tablet Take 1,000 mg by mouth every 6 (six) hours as needed.   Yes [provider]  albuterol (VENTOLIN HFA) 108 (90 Base) MCG/ACT inhaler Inhale 2 puffs into the lungs every 4 (four) hours as needed. 11/06/20  Yes [provider]  famotidine (PEPCID) 20 MG tablet Take 1 tablet (20 mg total) by mouth 2 (two) times daily. 11/12/20  Yes Wieters, Hallie C, PA-C  meclizine (ANTIVERT) 25 MG tablet Take 25 mg by mouth daily.   Yes [provider]  sucralfate (CARAFATE) 1 g tablet Take 1 g by mouth 4 (four) times daily.  Yes [provider]  ondansetron (ZOFRAN) 4 MG tablet Take 1 tablet (4 mg total) by mouth every 8 (eight) hours as needed for up to 10 doses for nausea or vomiting. 12/22/20   Virgina Norfolkuratolo, Adam, DO  etonogestrel-ethinyl estradiol (NUVARING) 0.12-0.015 MG/24HR vaginal ring Place 1 each vaginally every 28 (twenty-eight) days. Insert vaginally and leave in place for 3 consecutive weeks, then remove for 1 week. Patient not taking: No sig reported 10/02/20 11/12/20  Wyline BeadyWallace, Tiffany A, NP    Allergies    Eggs or egg-derived products, Nutmeg oil (myristica oil), Ceftin [cefuroxime], Guggulipid-black pepper, Iodine, and Nickel  Review of Systems   Review of Systems  Constitutional:  Negative for chills.  HENT:  Negative for sore throat.   Eyes:  Negative for redness.  Respiratory:  Negative for shortness of breath.   Skin:  Negative for rash.  All other systems reviewed and are negative.  Physical Exam Updated Vital Signs BP 121/78   Pulse 81   Temp 98.1 F (36.7 C)  (Oral)   Resp 16   Ht 5\' 3"  (1.6 m)   Wt 83.9 kg   SpO2 97%   BMI 32.77 kg/m   Physical Exam Vitals and nursing note reviewed.  Constitutional:      General: She is not in acute distress.    Appearance: Normal appearance.  HENT:     Head: Normocephalic and atraumatic.     Nose: Nose normal.  Eyes:     Conjunctiva/sclera: Conjunctivae normal.     Pupils: Pupils are equal, round, and reactive to light.  Cardiovascular:     Rate and Rhythm: Normal rate and regular rhythm.     Pulses: Normal pulses.     Heart sounds: Normal heart sounds.  Pulmonary:     Effort: Pulmonary effort is normal.     Breath sounds: Normal breath sounds.  Abdominal:     General: Abdomen is flat. Bowel sounds are increased.     Palpations: Abdomen is soft.     Tenderness: There is no abdominal tenderness. There is no guarding or rebound.     Hernia: No hernia is present.     Comments: Gassy throughout   Musculoskeletal:     Cervical back: Normal range of motion and neck supple.  Neurological:     General: No focal deficit present.     Mental Status: She is alert and oriented to person, place, and time.    ED Results / Procedures / Treatments   Labs (all labs ordered are listed, but only abnormal results are displayed) Results for orders placed or performed during the hospital encounter of 01/25/21  CBC with Differential/Platelet  Result Value Ref Range   WBC 12.7 (H) 4.0 - 10.5 K/uL   RBC 4.86 3.87 - 5.11 MIL/uL   Hemoglobin 14.2 12.0 - 15.0 g/dL   HCT 16.143.2 09.636.0 - 04.546.0 %   MCV 88.9 80.0 - 100.0 fL   MCH 29.2 26.0 - 34.0 pg   MCHC 32.9 30.0 - 36.0 g/dL   RDW 40.912.7 81.111.5 - 91.415.5 %   Platelets 256 150 - 400 K/uL   nRBC 0.0 0.0 - 0.2 %   Neutrophils Relative % 78 %   Neutro Abs 10.0 (H) 1.7 - 7.7 K/uL   Lymphocytes Relative 14 %   Lymphs Abs 1.7 0.7 - 4.0 K/uL   Monocytes Relative 7 %   Monocytes Absolute 0.9 0.1 - 1.0 K/uL   Eosinophils Relative 1 %   Eosinophils Absolute  0.1 0.0 - 0.5 K/uL    Basophils Relative 0 %   Basophils Absolute 0.0 0.0 - 0.1 K/uL   Immature Granulocytes 0 %   Abs Immature Granulocytes 0.05 0.00 - 0.07 K/uL  Comprehensive metabolic panel  Result Value Ref Range   Sodium 138 135 - 145 mmol/L   Potassium 3.7 3.5 - 5.1 mmol/L   Chloride 105 98 - 111 mmol/L   CO2 24 22 - 32 mmol/L   Glucose, Bld 101 (H) 70 - 99 mg/dL   BUN 9 6 - 20 mg/dL   Creatinine, Ser 8.75 0.44 - 1.00 mg/dL   Calcium 8.9 8.9 - 64.3 mg/dL   Total Protein 7.5 6.5 - 8.1 g/dL   Albumin 4.3 3.5 - 5.0 g/dL   AST 24 15 - 41 U/L   ALT 20 0 - 44 U/L   Alkaline Phosphatase 62 38 - 126 U/L   Total Bilirubin 0.7 0.3 - 1.2 mg/dL   GFR, Estimated >32 >95 mL/min   Anion gap 9 5 - 15  Urinalysis, Routine w reflex microscopic Urine, Clean Catch  Result Value Ref Range   Color, Urine YELLOW YELLOW   APPearance CLEAR CLEAR   Specific Gravity, Urine 1.010 1.005 - 1.030   pH 7.0 5.0 - 8.0   Glucose, UA NEGATIVE NEGATIVE mg/dL   Hgb urine dipstick NEGATIVE NEGATIVE   Bilirubin Urine NEGATIVE NEGATIVE   Ketones, ur 20 (A) NEGATIVE mg/dL   Protein, ur NEGATIVE NEGATIVE mg/dL   Nitrite NEGATIVE NEGATIVE   Leukocytes,Ua TRACE (A) NEGATIVE   RBC / HPF 0-5 0 - 5 RBC/hpf   WBC, UA 0-5 0 - 5 WBC/hpf   Bacteria, UA RARE (A) NONE SEEN   Squamous Epithelial / LPF 0-5 0 - 5   Mucus PRESENT   D-dimer, quantitative  Result Value Ref Range   D-Dimer, Quant 0.31 0.00 - 0.50 ug/mL-FEU  I-stat chem 8, ED (not at [facility] or [facility])  Result Value Ref Range   Sodium 141 135 - 145 mmol/L   Potassium 3.6 3.5 - 5.1 mmol/L   Chloride 105 98 - 111 mmol/L   BUN 7 6 - 20 mg/dL   Creatinine, Ser 1.88 0.44 - 1.00 mg/dL   Glucose, Bld 416 (H) 70 - 99 mg/dL   Calcium, Ion 6.06 3.01 - 1.40 mmol/L   TCO2 24 22 - 32 mmol/L   Hemoglobin 15.0 12.0 - 15.0 g/dL   HCT 60.1 09.3 - 23.5 %  I-Stat Beta hCG blood, ED ([facility], [facility], [facility] only)  Result Value Ref Range   I-stat hCG, quantitative <5.0 <5 mIU/mL   Comment 3            US Abdomen Limited  Result Date: 01/26/2021 CLINICAL DATA:  Right upper quadrant pain for 4 days EXAM: ULTRASOUND ABDOMEN LIMITED RIGHT UPPER QUADRANT COMPARISON:  None. FINDINGS: Gallbladder: No gallstones or wall thickening visualized. No sonographic Murphy sign noted by sonographer. Common bile duct: Diameter: 2 mm, normal Liver: Diffusely increased hepatic parenchymal echotexture suggesting fatty infiltration. No focal lesions identified. Portal vein is patent on color Doppler imaging with normal direction of blood flow towards the liver. Other: None. IMPRESSION: Diffuse fatty infiltration of the liver. No evidence of cholelithiasis or cholecystitis. Electronically Signed   By: Burman Nieves M.D.   On: 01/26/2021 01:55     Radiology US Abdomen Limited  Result Date: 01/26/2021 CLINICAL DATA:  Right upper quadrant pain for 4 days EXAM: ULTRASOUND ABDOMEN LIMITED RIGHT UPPER QUADRANT COMPARISON:  None. FINDINGS: Gallbladder: No gallstones or wall thickening visualized. No sonographic Murphy sign noted by sonographer. Common bile duct: Diameter: 2 mm, normal Liver: Diffusely increased hepatic parenchymal echotexture suggesting fatty infiltration. No focal lesions identified. Portal vein is patent on color Doppler imaging with normal direction of blood flow towards the liver. Other: None. IMPRESSION: Diffuse fatty infiltration of the liver. No evidence of cholelithiasis or cholecystitis. Electronically Signed   By: Burman Nieves M.D.   On: 01/26/2021 01:55    Procedures Procedures   Medications Ordered in ED Medications  ketorolac (TORADOL) 30 MG/ML injection 15 mg (15 mg Intravenous Given 01/26/21 0211)  sodium bicarbonate injection 50 mEq (50 mEq Intravenous Given 01/26/21 0211)  sodium chloride 0.9 % bolus 500 mL (0 mLs Intravenous Stopped 01/26/21 0244)  alum & mag hydroxide-simeth (MAALOX/MYLANTA) 200-200-20 MG/5ML suspension 30 mL (30 mLs Oral Given 01/26/21 0246)    ED Course  I have  reviewed the triage vital signs and the nursing notes.  Pertinent labs & imaging results that were available during my care of the patient were reviewed by me and considered in my medical decision making (see chart for details).    Low risk for PE with negative Ddimer, excludes PE.  No acute cholecystitis.  Patient is very gassy on Exam.  This is likely gas and patient's known IBS.  Will treat for same.    Stefanie Lucero was evaluated in Emergency Department on 01/26/2021 for the symptoms described in the history of present illness. She was evaluated in the context of the global COVID-19 pandemic, which necessitated consideration that the patient might be at risk for infection with the SARS-CoV-2 virus that causes COVID-19. Institutional protocols and algorithms that pertain to the evaluation of patients at risk for COVID-19 are in a state of rapid change based on information released by regulatory bodies including the CDC and federal and state organizations. These policies and algorithms were followed during the patient's care in the ED.  Final Clinical Impression(s) / ED Diagnoses Final diagnoses:  None    Return for intractable cough, coughing up blood, fevers > 100.4 unrelieved by medication, shortness of breath, intractable vomiting, chest pain, shortness of breath, weakness, numbness, changes in speech, facial asymmetry, abdominal pain, passing out, Inability to tolerate liquids or food, cough, altered mental status or any concerns. No signs of systemic illness or infection. The patient is nontoxic-appearing on exam and vital signs are within normal limits. I have reviewed the triage vital signs and the nursing notes. Pertinent labs & imaging results that were available during my care of the patient were reviewed by me and considered in my medical decision making (see chart for details). After history, exam, and medical workup I feel the patient has been appropriately medically screened and is  safe for discharge home. Pertinent diagnoses were discussed with the patient. Patient was given return precautions.  Rx / DC Orders ED Discharge Orders     None        Danil Wedge, MD 01/26/21 4010    Nicanor Alcon, Orlan Aversa, MD 01/26/21 2725

## 2021-01-26 NOTE — ED Triage Notes (Signed)
Pt reports abd pain x 3 days, seen at [place] last night with abd workup that showed "sludge in my gallbladder" was told to return to ed if pain cont, pt states her pain is no better today. Denies fevers, calm and cooperative at triage in nad, rates pain at 10/10.

## 2021-01-26 NOTE — ED Notes (Signed)
PT vomiting, Will obtain EKG when still

## 2021-01-26 NOTE — Discharge Instructions (Addendum)
You were seen in the emergency department today for your abdominal pain, nausea, vomiting, and abnormal bowel movements.  Your physical exam and vital signs are reassuring, as was your blood work and your CT scan.  Given the results from this visit and those you had from the you had earlier this morning, your work-up is very reassuring.  You likely have a viral infection causing your symptoms including your fever that is new today.  You may continue to take Tylenol or ibuprofen as needed for fevers at home.  You may utilize the dicyclomine prescribed to you this morning as needed for belly cramping, and you have been prescribed a nausea medication which you may take as needed at home.  Return to the ER with any worsening abdominal pain, nausea or vomiting does not stop, or any other new severe symptoms.

## 2021-02-02 DIAGNOSIS — M791 Myalgia, unspecified site: Secondary | ICD-10-CM | POA: Diagnosis not present

## 2021-02-02 DIAGNOSIS — K2970 Gastritis, unspecified, without bleeding: Secondary | ICD-10-CM | POA: Diagnosis not present

## 2021-02-13 ENCOUNTER — Ambulatory Visit: Payer: BC Managed Care – PPO

## 2021-02-23 ENCOUNTER — Encounter (HOSPITAL_BASED_OUTPATIENT_CLINIC_OR_DEPARTMENT_OTHER): Payer: Self-pay | Admitting: *Deleted

## 2021-02-23 ENCOUNTER — Other Ambulatory Visit: Payer: Self-pay

## 2021-02-23 ENCOUNTER — Emergency Department (HOSPITAL_BASED_OUTPATIENT_CLINIC_OR_DEPARTMENT_OTHER): Payer: BC Managed Care – PPO

## 2021-02-23 ENCOUNTER — Emergency Department (HOSPITAL_BASED_OUTPATIENT_CLINIC_OR_DEPARTMENT_OTHER)
Admission: EM | Admit: 2021-02-23 | Discharge: 2021-02-24 | Disposition: A | Discharge status: Home / Self Care | Payer: BC Managed Care – PPO | Attending: Emergency Medicine | Admitting: Emergency Medicine

## 2021-02-23 DIAGNOSIS — R059 Cough, unspecified: Secondary | ICD-10-CM | POA: Insufficient documentation

## 2021-02-23 DIAGNOSIS — R072 Precordial pain: Secondary | ICD-10-CM | POA: Diagnosis not present

## 2021-02-23 DIAGNOSIS — R0981 Nasal congestion: Secondary | ICD-10-CM | POA: Insufficient documentation

## 2021-02-23 DIAGNOSIS — R079 Chest pain, unspecified: Secondary | ICD-10-CM | POA: Diagnosis not present

## 2021-02-23 DIAGNOSIS — R509 Fever, unspecified: Secondary | ICD-10-CM | POA: Insufficient documentation

## 2021-02-23 DIAGNOSIS — Z20822 Contact with and (suspected) exposure to covid-19: Secondary | ICD-10-CM | POA: Diagnosis not present

## 2021-02-23 LAB — URINALYSIS, ROUTINE W REFLEX MICROSCOPIC
Bilirubin Urine: NEGATIVE
Glucose, UA: NEGATIVE mg/dL
Ketones, ur: NEGATIVE mg/dL
Leukocytes,Ua: NEGATIVE
Nitrite: NEGATIVE
Protein, ur: NEGATIVE mg/dL
Specific Gravity, Urine: 1.01 (ref 1.005–1.030)
pH: 7 (ref 5.0–8.0)

## 2021-02-23 LAB — URINALYSIS, MICROSCOPIC (REFLEX)

## 2021-02-23 LAB — COMPREHENSIVE METABOLIC PANEL
ALT: 18 U/L (ref 0–44)
AST: 19 U/L (ref 15–41)
Albumin: 4.1 g/dL (ref 3.5–5.0)
Alkaline Phosphatase: 61 U/L (ref 38–126)
Anion gap: 7 (ref 5–15)
BUN: 9 mg/dL (ref 6–20)
CO2: 25 mmol/L (ref 22–32)
Calcium: 8.6 mg/dL — ABNORMAL LOW (ref 8.9–10.3)
Chloride: 105 mmol/L (ref 98–111)
Creatinine, Ser: 0.69 mg/dL (ref 0.44–1.00)
GFR, Estimated: >60 mL/min (ref >60)
Glucose, Bld: 119 mg/dL — ABNORMAL HIGH (ref 70–99)
Potassium: 4.2 mmol/L (ref 3.5–5.1)
Sodium: 137 mmol/L (ref 135–145)
Total Bilirubin: 0.4 mg/dL (ref 0.3–1.2)
Total Protein: 7.1 g/dL (ref 6.5–8.1)

## 2021-02-23 LAB — CBC
HCT: 39.9 % (ref 36.0–46.0)
Hemoglobin: 13.1 g/dL (ref 12.0–15.0)
MCH: 29.2 pg (ref 26.0–34.0)
MCHC: 32.8 g/dL (ref 30.0–36.0)
MCV: 88.9 fL (ref 80.0–100.0)
Platelets: 255 10*3/uL (ref 150–400)
RBC: 4.49 MIL/uL (ref 3.87–5.11)
RDW: 13 % (ref 11.5–15.5)
WBC: 12 10*3/uL — ABNORMAL HIGH (ref 4.0–10.5)
nRBC: 0 % (ref 0.0–0.2)

## 2021-02-23 LAB — PREGNANCY, URINE: Preg Test, Ur: NEGATIVE

## 2021-02-23 NOTE — ED Notes (Signed)
Pt states her pain today is in her L upper lateral chest and goes into her shoulder blade and neck. Pt reports that when she breaths in the pain catches her and is momentarily worsened. Pt describes the pain as tight and piercing. Pt has associated nausea. Pain started at 1600 today. Denies epigastric pain or abd pain.

## 2021-02-23 NOTE — ED Triage Notes (Addendum)
C/o epigastric pain " burning chest pain "  with nausea, SOB x 3 hrs

## 2021-02-24 ENCOUNTER — Emergency Department (HOSPITAL_BASED_OUTPATIENT_CLINIC_OR_DEPARTMENT_OTHER): Payer: BC Managed Care – PPO

## 2021-02-24 ENCOUNTER — Encounter (HOSPITAL_BASED_OUTPATIENT_CLINIC_OR_DEPARTMENT_OTHER): Payer: Self-pay | Admitting: Emergency Medicine

## 2021-02-24 LAB — RESP PANEL BY RT-PCR (FLU A&B, COVID) ARPGX2
Influenza A by PCR: NEGATIVE
Influenza B by PCR: NEGATIVE
SARS Coronavirus 2 by RT PCR: NEGATIVE

## 2021-02-24 LAB — D-DIMER, QUANTITATIVE: D-Dimer, Quant: 0.28 ug/mL-FEU (ref 0.00–0.50)

## 2021-02-24 LAB — TROPONIN I (HIGH SENSITIVITY): Troponin I (High Sensitivity): <2 ng/L (ref <18)

## 2021-02-24 MED ORDER — KETOROLAC TROMETHAMINE 30 MG/ML IJ SOLN
30.0000 mg | Freq: Once | INTRAMUSCULAR | Status: AC
  Administered 2021-02-24: 30 mg via INTRAVENOUS
  Filled 2021-02-24: qty 1

## 2021-02-24 MED ORDER — IOHEXOL 350 MG/ML SOLN: 75.0000 mL | Freq: Once | INTRAVENOUS | Status: DC | PRN

## 2021-02-24 NOTE — ED Provider Notes (Signed)
[facility] EMERGENCY DEPARTMENT Provider Note   CSN: [id number] Arrival date & time: 02/23/21  2025     History Chief Complaint  Patient presents with   Abdominal Pain    Stefanie Lucero is a 30 y.o. female.  The history is provided by the patient.  Chest Pain Pain location:  L lateral chest Pain quality: sharp   Pain radiates to:  Does not radiate Pain severity:  Moderate Onset quality:  Gradual Duration:  3 days Timing:  Constant Progression:  Waxing and waning Chronicity:  New Context: at rest   Context: not breathing   Relieved by:  Nothing Worsened by:  Nothing Ineffective treatments:  None tried Associated symptoms: cough and fever   Associated symptoms: no abdominal pain, no altered mental status, no anxiety, no diaphoresis, no dizziness, no dysphagia, no near-syncope, no orthopnea, no palpitations, no shortness of breath, no vomiting and no weakness   Risk factors: no birth control, no diabetes mellitus, not female, not pregnant and no prior DVT/PE   Associated cough and congestion.  No f/c/r.  No travel, no OCP, no leg pain.      Past Medical History:  Diagnosis Date   Anxiety    Eosinophilic esophagitis    Migraine    STD (sexually transmitted disease)    trichomonas treated 2020    Patient Active Problem List   Diagnosis Date Noted   History of trichomoniasis 12/07/2019   IUD (intrauterine device) in place 12/07/2019   Intractable migraine 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 07/05/2016    Past Surgical History:  Procedure Laterality Date   CESAREAN SECTION     x 2   INTRAUTERINE DEVICE (IUD) INSERTION     mirena inserted 12-07-2019   TONSILLECTOMY  2001     OB History     Gravida  2   Para  2   Term  2   Preterm      AB      Living  2      SAB      IAB      Ectopic      Multiple      Live Births  2           Family History  Problem Relation Age of Onset   Hypertension Mother    Heart attack Father         times 2   Hypertension Father    Alzheimer's disease Maternal Grandmother    Irregular heart beat Maternal Grandmother    Stroke Maternal Grandfather    Gallbladder disease Maternal Grandfather    Colon cancer Paternal Grandfather 77            Social History   Tobacco Use   Smoking status: Never   Smokeless tobacco: Never  Substance Use Topics   Alcohol use: Not Currently    Comment: 1-2 every 2 weeks   Drug use: No    Home Medications Prior to Admission medications   Medication Sig Start Date End Date Taking? Authorizing Provider  acetaminophen (TYLENOL) 500 MG tablet Take 1,000 mg by mouth every 6 (six) hours as needed.    [provider]  albuterol (VENTOLIN HFA) 108 (90 Base) MCG/ACT inhaler Inhale 2 puffs into the lungs every 4 (four) hours as needed. 11/06/20   [provider]  dicyclomine (BENTYL) 20 MG tablet Take 1 tablet (20 mg total) by mouth 2 (two) times daily. 01/26/21   Aries Kasa, MD  famotidine (PEPCID) 20 MG tablet Take 1 tablet (20 mg total) by mouth 2 (two) times daily. 11/12/20   Wieters, Hallie C, PA-C  meclizine (ANTIVERT) 25 MG tablet Take 25 mg by mouth daily.    [provider]  ondansetron (ZOFRAN ODT) 4 MG disintegrating tablet Take 1 tablet (4 mg total) by mouth every 8 (eight) hours as needed for nausea or vomiting. 01/26/21   Sponseller, Lupe Carney R, PA-C  sucralfate (CARAFATE) 1 g tablet Take 1 g by mouth 4 (four) times daily.    [provider]  etonogestrel-ethinyl estradiol (NUVARING) 0.12-0.015 MG/24HR vaginal ring Place 1 each vaginally every 28 (twenty-eight) days. Insert vaginally and leave in place for 3 consecutive weeks, then remove for 1 week. Patient not taking: No sig reported 10/02/20 11/12/20  [name], NP    Allergies    Eggs or egg-derived products, Nutmeg oil (myristica oil), Ceftin [cefuroxime], Guggulipid-black pepper, Iodine, and Nickel  Review of Systems   Review of Systems   Constitutional:  Positive for fever. Negative for diaphoresis.  HENT:  Positive for congestion. Negative for trouble swallowing.   Eyes:  Negative for redness.  Respiratory:  Positive for cough. Negative for shortness of breath.   Cardiovascular:  Positive for chest pain. Negative for palpitations, orthopnea, leg swelling and near-syncope.  Gastrointestinal:  Negative for abdominal pain and vomiting.  Genitourinary:  Negative for difficulty urinating.  Musculoskeletal:  Negative for arthralgias.  Skin:  Negative for rash.  Neurological:  Negative for dizziness and weakness.  Psychiatric/Behavioral:  Negative for agitation.   All other systems reviewed and are negative.  Physical Exam Updated Vital Signs BP 103/81   Pulse 96   Temp 98.7 F (37.1 C) (Oral)   Resp 16   Ht  (1.6 m)   Wt 83.9 kg   LMP 02/11/2021   SpO2 99%   BMI 32.77 kg/m   Physical Exam Vitals and nursing note reviewed.  Constitutional:      General: She is not in acute distress.    Appearance: Normal appearance.  HENT:     Head: Normocephalic and atraumatic.     Nose: Nose normal.  Eyes:     Conjunctiva/sclera: Conjunctivae normal.     Pupils: Pupils are equal, round, and reactive to light.  Cardiovascular:     Rate and Rhythm: Normal rate and regular rhythm.     Pulses: Normal pulses.     Heart sounds: Normal heart sounds.  Pulmonary:     Effort: Pulmonary effort is normal.     Breath sounds: Normal breath sounds.  Abdominal:     General: Abdomen is flat. Bowel sounds are normal.     Palpations: Abdomen is soft.     Tenderness: There is no abdominal tenderness. There is no guarding.  Musculoskeletal:        General: Normal range of motion.     Cervical back: Normal range of motion and neck supple.  Skin:    General: Skin is warm and dry.     Capillary Refill: Capillary refill takes less than 2 seconds.  Neurological:     Mental Status: She is alert and oriented to person, place, and time.      Deep Tendon Reflexes: Reflexes normal.  Psychiatric:        Mood and Affect: Mood normal.        Behavior: Behavior normal.    ED Results / Procedures / Treatments   Labs (all labs ordered are listed, but  only abnormal results are displayed) Results for orders placed or performed during the hospital encounter of 02/23/21  Resp Panel by RT-PCR (Flu A&B, Covid) Nasopharyngeal Swab   Specimen: Nasopharyngeal Swab; Nasopharyngeal(NP) swabs in vial transport medium  Result Value Ref Range   SARS Coronavirus 2 by RT PCR NEGATIVE NEGATIVE   Influenza A by PCR NEGATIVE NEGATIVE   Influenza B by PCR NEGATIVE NEGATIVE  Comprehensive metabolic panel  Result Value Ref Range   Sodium 137 135 - 145 mmol/L   Potassium 4.2 3.5 - 5.1 mmol/L   Chloride 105 98 - 111 mmol/L   CO2 25 22 - 32 mmol/L   Glucose, Bld 119 (H) 70 - 99 mg/dL   BUN 9 6 - 20 mg/dL   Creatinine, Ser 1.610.69 0.44 - 1.00 mg/dL   Calcium 8.6 (L) 8.9 - 10.3 mg/dL   Total Protein 7.1 6.5 - 8.1 g/dL   Albumin 4.1 3.5 - 5.0 g/dL   AST 19 15 - 41 U/L   ALT 18 0 - 44 U/L   Alkaline Phosphatase 61 38 - 126 U/L   Total Bilirubin 0.4 0.3 - 1.2 mg/dL   GFR, Estimated >09>60 >60>60 mL/min   Anion gap 7 5 - 15  CBC  Result Value Ref Range   WBC 12.0 (H) 4.0 - 10.5 K/uL   RBC 4.49 3.87 - 5.11 MIL/uL   Hemoglobin 13.1 12.0 - 15.0 g/dL   HCT 45.439.9 09.836.0 - 11.946.0 %   MCV 88.9 80.0 - 100.0 fL   MCH 29.2 26.0 - 34.0 pg   MCHC 32.8 30.0 - 36.0 g/dL   RDW 14.713.0 82.911.5 - 56.215.5 %   Platelets 255 150 - 400 K/uL   nRBC 0.0 0.0 - 0.2 %  Urinalysis, Routine w reflex microscopic  Result Value Ref Range   Color, Urine YELLOW YELLOW   APPearance CLEAR CLEAR   Specific Gravity, Urine 1.010 1.005 - 1.030   pH 7.0 5.0 - 8.0   Glucose, UA NEGATIVE NEGATIVE mg/dL   Hgb urine dipstick SMALL (A) NEGATIVE   Bilirubin Urine NEGATIVE NEGATIVE   Ketones, ur NEGATIVE NEGATIVE mg/dL   Protein, ur NEGATIVE NEGATIVE mg/dL   Nitrite NEGATIVE NEGATIVE    Leukocytes,Ua NEGATIVE NEGATIVE  Pregnancy, urine  Result Value Ref Range   Preg Test, Ur NEGATIVE NEGATIVE  Urinalysis, Microscopic (reflex)  Result Value Ref Range   RBC / HPF 0-5 0 - 5 RBC/hpf   WBC, UA 0-5 0 - 5 WBC/hpf   Bacteria, UA RARE (A) NONE SEEN   Squamous Epithelial / LPF 0-5 0 - 5  D-dimer, quantitative  Result Value Ref Range   D-Dimer, Quant 0.28 0.00 - 0.50 ug/mL-FEU  Troponin I (High Sensitivity)  Result Value Ref Range   Troponin I (High Sensitivity) <2 <18 ng/L   CT ABDOMEN PELVIS WO CONTRAST  Result Date: 01/26/2021 CLINICAL DATA:  Nonlocalized acute abdominal pain. Vomiting. Colace colored stools. EXAM: CT ABDOMEN AND PELVIS WITHOUT CONTRAST TECHNIQUE: Multidetector CT imaging of the abdomen and pelvis was performed following the standard protocol without IV contrast. COMPARISON:  CT abdomen pelvis 11/07/2020. CT renal 02/03/2019, CT abdomen pelvis 01/28/2015 FINDINGS: Lower chest: No acute abnormality. Hepatobiliary: No focal liver abnormality. No gallstones, gallbladder wall thickening, or pericholecystic fluid. No biliary dilatation. Pancreas: Similar-appearing fatty atrophy of the proximal pancreas. Otherwise no focal lesion. Normal pancreatic contour. No surrounding inflammatory changes. No main pancreatic ductal dilatation. Spleen: Normal in size without focal abnormality. Adrenals/Urinary Tract: No adrenal nodule bilaterally.  No nephrolithiasis, no hydronephrosis, and no contour-deforming renal mass. No ureterolithiasis or hydroureter. The urinary bladder is unremarkable. Stomach/Bowel: Stomach is within normal limits. No evidence of bowel wall thickening or dilatation. Appendix appears normal. Vascular/Lymphatic: No abdominal aorta or iliac aneurysm. No abdominal, pelvic, or inguinal lymphadenopathy. Reproductive: Uterus and bilateral adnexa are unremarkable. Other: No intraperitoneal free fluid. No intraperitoneal free gas. No organized fluid collection.  Musculoskeletal: No abdominal wall hernia or abnormality. No suspicious lytic or blastic osseous lesions. No acute displaced fracture. IMPRESSION: 1. No acute intra-abdominal or intrapelvic abnormality on this noncontrast study. 2. Similar-appearing fatty atrophy of the proximal pancreas. Electronically Signed   By: Tish Frederickson M.D.   On: 01/26/2021 15:17   DG Chest 2 View  Result Date: 02/23/2021 CLINICAL DATA:  Left upper lateral chest pain radiating to left shoulder and neck, pain with inspiration EXAM: CHEST - 2 VIEW COMPARISON:  12/22/2020 FINDINGS: Frontal and lateral views of the chest demonstrate an unremarkable cardiac silhouette. No airspace disease, effusion, or pneumothorax. No acute bony abnormalities. IMPRESSION: 1. No acute intrathoracic process. Electronically Signed   By: Sharlet Salina M.D.   On: 02/23/2021 23:13   US Abdomen Limited  Result Date: 01/26/2021 CLINICAL DATA:  Right upper quadrant pain for 4 days EXAM: ULTRASOUND ABDOMEN LIMITED RIGHT UPPER QUADRANT COMPARISON:  None. FINDINGS: Gallbladder: No gallstones or wall thickening visualized. No sonographic Murphy sign noted by sonographer. Common bile duct: Diameter: 2 mm, normal Liver: Diffusely increased hepatic parenchymal echotexture suggesting fatty infiltration. No focal lesions identified. Portal vein is patent on color Doppler imaging with normal direction of blood flow towards the liver. Other: None. IMPRESSION: Diffuse fatty infiltration of the liver. No evidence of cholelithiasis or cholecystitis. Electronically Signed   By: Burman Nieves M.D.   On: 01/26/2021 01:55     EKG None  Radiology DG Chest 2 View  Result Date: 02/23/2021 CLINICAL DATA:  Left upper lateral chest pain radiating to left shoulder and neck, pain with inspiration EXAM: CHEST - 2 VIEW COMPARISON:  12/22/2020 FINDINGS: Frontal and lateral views of the chest demonstrate an unremarkable cardiac silhouette. No airspace disease, effusion, or  pneumothorax. No acute bony abnormalities. IMPRESSION: 1. No acute intrathoracic process. Electronically Signed   By: Sharlet Salina M.D.   On: 02/23/2021 23:13    Procedures Procedures   Medications Ordered in ED Medications  iohexol (OMNIPAQUE) 350 MG/ML injection 75 mL ( Intravenous Canceled Entry 02/24/21 0138)  ketorolac (TORADOL) 30 MG/ML injection 30 mg (30 mg Intravenous Given 02/24/21 0037)    ED Course  I have reviewed the triage vital signs and the nursing notes.  Pertinent labs & imaging results that were available during my care of the patient were reviewed by me and considered in my medical decision making (see chart for details). Ruled out for COVID MI and PE in the ED.  Likely MSK chest wall pain.  Alternate tylenol and motrin, use heat.  Follow up with your PMD.  Stefanie Lucero was evaluated in Emergency Department on 02/24/2021 for the symptoms described in the history of present illness. She was evaluated in the context of the global COVID-19 pandemic, which necessitated consideration that the patient might be at risk for infection with the SARS-CoV-2 virus that causes COVID-19. Institutional protocols and algorithms that pertain to the evaluation of patients at risk for COVID-19 are in a state of rapid change based on information released by regulatory bodies including the CDC and federal and state organizations. These policies  and algorithms were followed during the patient's care in the ED.  Final Clinical Impression(s) / ED Diagnoses Final diagnoses:  None    Return for intractable cough, coughing up blood, fevers > 100.4 unrelieved by medication, shortness of breath, intractable vomiting, chest pain, shortness of breath, weakness, numbness, changes in speech, facial asymmetry, abdominal pain, passing out, Inability to tolerate liquids or food, cough, altered mental status or any concerns. No signs of systemic illness or infection. The patient is nontoxic-appearing on exam  and vital signs are within normal limits. I have reviewed the triage vital signs and the nursing notes. Pertinent labs & imaging results that were available during my care of the patient were reviewed by me and considered in my medical decision making (see chart for details). After history, exam, and medical workup I feel the patient has been appropriately medically screened and is safe for discharge home. Pertinent diagnoses were discussed with the patient. Patient was given return precautions.  Rx / DC Orders ED Discharge Orders     None        Siniyah Evangelist, MD 02/24/21 3295

## 2021-02-24 NOTE — ED Notes (Signed)
Pt provided discharge instructions and prescription information. Pt was given the opportunity to ask questions and questions were answered. Discharge signature not obtained in the setting of the COVID-19 pandemic in order to reduce high touch surfaces.  ° °

## 2021-02-24 NOTE — ED Notes (Signed)
Patient transported to CT 

## 2021-03-02 DIAGNOSIS — K200 Eosinophilic esophagitis: Secondary | ICD-10-CM | POA: Diagnosis not present

## 2021-03-02 DIAGNOSIS — K219 Gastro-esophageal reflux disease without esophagitis: Secondary | ICD-10-CM | POA: Diagnosis not present

## 2021-03-05 ENCOUNTER — Other Ambulatory Visit: Payer: Self-pay

## 2021-03-05 ENCOUNTER — Ambulatory Visit
Admission: EM | Admit: 2021-03-05 | Discharge: 2021-03-05 | Disposition: A | Discharge status: Home / Self Care | Payer: BC Managed Care – PPO | Attending: Urgent Care | Admitting: Urgent Care

## 2021-03-05 DIAGNOSIS — G43809 Other migraine, not intractable, without status migrainosus: Secondary | ICD-10-CM

## 2021-03-05 DIAGNOSIS — J018 Other acute sinusitis: Secondary | ICD-10-CM

## 2021-03-05 DIAGNOSIS — J3089 Other allergic rhinitis: Secondary | ICD-10-CM

## 2021-03-05 HISTORY — DX: Unspecified asthma, uncomplicated: J45.909

## 2021-03-05 MED ORDER — KETOROLAC TROMETHAMINE 60 MG/2ML IM SOLN
60.0000 mg | Freq: Once | INTRAMUSCULAR | Status: AC
Start: 2021-03-05 — End: 2021-03-05
  Administered 2021-03-05: 60 mg via INTRAMUSCULAR

## 2021-03-05 MED ORDER — FLUTICASONE PROPIONATE 50 MCG/ACT NA SUSP: 2.0000 | Freq: Every day | NASAL | 12 refills | Status: DC

## 2021-03-05 MED ORDER — AMOXICILLIN-POT CLAVULANATE 875-125 MG PO TABS: 1.0000 | ORAL_TABLET | Freq: Two times a day (BID) | ORAL | 0 refills | Status: DC

## 2021-03-05 MED ORDER — PSEUDOEPHEDRINE HCL 60 MG PO TABS: 60.0000 mg | ORAL_TABLET | Freq: Three times a day (TID) | ORAL | 0 refills | Status: DC | PRN

## 2021-03-05 MED ORDER — CETIRIZINE HCL 10 MG PO TABS: 10.0000 mg | ORAL_TABLET | Freq: Every day | ORAL | 0 refills | Status: DC

## 2021-03-05 NOTE — Discharge Instructions (Addendum)
Take Augmentin for the sinus infection. You need to start Zyrtec now long term. Use pseudoephedrine through the weekend to help with your current bout of sinus inflammation. Once you are done with the antibiotic Augmentin, then start Flonase, the nasal steroid long term together with Zyrtec. Today, you are getting Toradol injection for your migraine and you can continue using otc medication like Excedrin for persistent migraines.

## 2021-03-05 NOTE — ED Triage Notes (Signed)
Pt c/o on-and-off "migraine" for the last week stating she has tried tylenol without relief and wakes up in the night. States it feels like someone is "stick their finger in both of my ears." States it is worse on the crown of the head. Described 7/10 stabbing pain.

## 2021-03-05 NOTE — ED Provider Notes (Signed)
[facility]   MRN: [id number] DOB: Jan 14, 1991  Subjective:   Stefanie Lucero is a 30 y.o. female presenting for 1 week history of persistent frontal headaches, sinus pressure, bilateral sharp intermittent ear pain.  Patient has had occasional photophobia.  Admits that the nature of her migraine is a bit different and has not been responding to her typical migraine treatments.  She does take Claritin daily.  Denies cough, chest pain, shortness of breath, neck stiffness.  She did take a COVID-19 test and was negative.  Denies smoking cigarettes.  She does have a history of asthma but does not feel like this is bothering her now.  No current facility-administered medications for this encounter.  Current Outpatient Medications:    acetaminophen (TYLENOL) 500 MG tablet, Take 1,000 mg by mouth every 6 (six) hours as needed., Disp: , Rfl:    albuterol (VENTOLIN HFA) 108 (90 Base) MCG/ACT inhaler, Inhale 2 puffs into the lungs every 4 (four) hours as needed., Disp: , Rfl:    dicyclomine (BENTYL) 20 MG tablet, Take 1 tablet (20 mg total) by mouth 2 (two) times daily., Disp: 20 tablet, Rfl: 0   famotidine (PEPCID) 20 MG tablet, Take 1 tablet (20 mg total) by mouth 2 (two) times daily., Disp: 30 tablet, Rfl: 0   meclizine (ANTIVERT) 25 MG tablet, Take 25 mg by mouth daily., Disp: , Rfl:    ondansetron (ZOFRAN ODT) 4 MG disintegrating tablet, Take 1 tablet (4 mg total) by mouth every 8 (eight) hours as needed for nausea or vomiting., Disp: 20 tablet, Rfl: 0   sucralfate (CARAFATE) 1 g tablet, Take 1 g by mouth 4 (four) times daily., Disp: , Rfl:    Allergies  Allergen Reactions   Eggs Or Egg-Derived Products Anaphylaxis and Hives   Nutmeg Oil (Myristica Oil) Anaphylaxis   Ceftin [Cefuroxime] Hives   Guggulipid-Black Pepper Hives    & White Pepper    Iodine    Nickel Rash    Past Medical History:  Diagnosis Date   Anxiety    Asthma    Eosinophilic esophagitis    Migraine    STD  (sexually transmitted disease)    trichomonas treated 2020     Past Surgical History:  Procedure Laterality Date   CESAREAN SECTION     x 2   INTRAUTERINE DEVICE (IUD) INSERTION     mirena inserted 12-07-2019   TONSILLECTOMY  2001    Family History  Problem Relation Age of Onset   Hypertension Mother    Heart attack Father        times 2   Hypertension Father    Alzheimer's disease Maternal Grandmother    Irregular heart beat Maternal Grandmother    Stroke Maternal Grandfather    Gallbladder disease Maternal Grandfather    Colon cancer Paternal Grandfather 43            Social History   Tobacco Use   Smoking status: Never   Smokeless tobacco: Never  Substance Use Topics   Alcohol use: Not Currently    Comment: 1-2 every 2 weeks   Drug use: No    ROS   Objective:   Vitals: BP 134/84 (BP Location: Left Arm)   Pulse 90   Temp 98.2 F (36.8 C) (Oral)   Resp 12   LMP 02/11/2021   SpO2 97%   Physical Exam Constitutional:      General: She is not in acute distress.    Appearance: Normal appearance. She  is well-developed. She is not ill-appearing, toxic-appearing or diaphoretic.  HENT:     Head: Normocephalic and atraumatic.     Right Ear: Tympanic membrane, ear canal and external ear normal. No drainage or tenderness. No middle ear effusion. Tympanic membrane is not erythematous.     Left Ear: Tympanic membrane, ear canal and external ear normal. No drainage or tenderness.  No middle ear effusion. Tympanic membrane is not erythematous.     Nose: No congestion or rhinorrhea.     Comments: Nasal mucosa boggy and erythematous.    Mouth/Throat:     Mouth: Mucous membranes are moist. No oral lesions.     Pharynx: No pharyngeal swelling, oropharyngeal exudate, posterior oropharyngeal erythema or uvula swelling.     Tonsils: No tonsillar exudate or tonsillar abscesses.  Eyes:     General: No scleral icterus.       Right eye: No discharge.        Left eye: No  discharge.     Extraocular Movements: Extraocular movements intact.     Right eye: Normal extraocular motion.     Left eye: Normal extraocular motion.     Conjunctiva/sclera: Conjunctivae normal.     Pupils: Pupils are equal, round, and reactive to light.  Cardiovascular:     Rate and Rhythm: Normal rate and regular rhythm.     Pulses: Normal pulses.     Heart sounds: Normal heart sounds. No murmur heard.   No friction rub. No gallop.  Pulmonary:     Effort: Pulmonary effort is normal. No respiratory distress.     Breath sounds: Normal breath sounds. No stridor. No wheezing, rhonchi or rales.  Musculoskeletal:     Cervical back: Normal range of motion and neck supple.  Lymphadenopathy:     Cervical: No cervical adenopathy.  Skin:    General: Skin is warm and dry.     Findings: No rash.  Neurological:     General: No focal deficit present.     Mental Status: She is alert and oriented to person, place, and time.     Cranial Nerves: No cranial nerve deficit or dysarthria.     Motor: No weakness.     Coordination: Romberg sign negative. Coordination normal.     Gait: Gait normal.     Comments: Negative Kernig and Brudzinski.  Psychiatric:        Mood and Affect: Mood normal.        Behavior: Behavior normal.        Thought Content: Thought content normal.        Judgment: Judgment normal.    Assessment and Plan :   PDMP not reviewed this encounter.  1. Acute non-recurrent sinusitis of other sinus   2. Other migraine without status migrainosus, not intractable   3. Allergic rhinitis due to other allergic trigger, unspecified seasonality     Mild atypical migraine, IM Toradol in clinic for this.  Recommended regular migraine medicine otherwise.  Will start empiric treatment for sinusitis with Augmentin.  Suspect primary problem is allergic rhinitis and recommended Zyrtec, Sudafed for now, once antibiotic course is done then switch to Zyrtec and Flonase with pseudoephedrine as  needed.  Patient declined COVID-19 testing.  Recommended supportive care otherwise including the use of oral antihistamine, decongestant. Counseled patient on potential for adverse effects with medications prescribed/recommended today, ER and return-to-clinic precautions discussed, patient verbalized understanding.    Wallis Bamberg, PA-C 03/05/21 1806

## 2021-03-08 DIAGNOSIS — Z20822 Contact with and (suspected) exposure to covid-19: Secondary | ICD-10-CM | POA: Diagnosis not present

## 2021-03-08 DIAGNOSIS — R519 Headache, unspecified: Secondary | ICD-10-CM | POA: Diagnosis not present

## 2021-03-11 DIAGNOSIS — H5213 Myopia, bilateral: Secondary | ICD-10-CM | POA: Diagnosis not present

## 2021-03-31 ENCOUNTER — Emergency Department (HOSPITAL_BASED_OUTPATIENT_CLINIC_OR_DEPARTMENT_OTHER)
Admission: EM | Admit: 2021-03-31 | Discharge: 2021-03-31 | Disposition: A | Discharge status: Home / Self Care | Payer: BC Managed Care – PPO | Attending: Emergency Medicine | Admitting: Emergency Medicine

## 2021-03-31 ENCOUNTER — Emergency Department (HOSPITAL_BASED_OUTPATIENT_CLINIC_OR_DEPARTMENT_OTHER): Payer: BC Managed Care – PPO

## 2021-03-31 ENCOUNTER — Other Ambulatory Visit: Payer: Self-pay

## 2021-03-31 DIAGNOSIS — R079 Chest pain, unspecified: Secondary | ICD-10-CM

## 2021-03-31 DIAGNOSIS — R059 Cough, unspecified: Secondary | ICD-10-CM | POA: Diagnosis not present

## 2021-03-31 DIAGNOSIS — I2510 Atherosclerotic heart disease of native coronary artery without angina pectoris: Secondary | ICD-10-CM | POA: Insufficient documentation

## 2021-03-31 DIAGNOSIS — H9313 Tinnitus, bilateral: Secondary | ICD-10-CM | POA: Diagnosis not present

## 2021-03-31 DIAGNOSIS — R002 Palpitations: Secondary | ICD-10-CM | POA: Diagnosis not present

## 2021-03-31 DIAGNOSIS — J302 Other seasonal allergic rhinitis: Secondary | ICD-10-CM | POA: Diagnosis not present

## 2021-03-31 DIAGNOSIS — R0789 Other chest pain: Secondary | ICD-10-CM | POA: Diagnosis not present

## 2021-03-31 DIAGNOSIS — U071 COVID-19: Secondary | ICD-10-CM | POA: Diagnosis not present

## 2021-03-31 DIAGNOSIS — J45909 Unspecified asthma, uncomplicated: Secondary | ICD-10-CM | POA: Insufficient documentation

## 2021-03-31 DIAGNOSIS — H6983 Other specified disorders of Eustachian tube, bilateral: Secondary | ICD-10-CM | POA: Diagnosis not present

## 2021-03-31 LAB — BASIC METABOLIC PANEL
Anion gap: 7 (ref 5–15)
BUN: 9 mg/dL (ref 6–20)
CO2: 25 mmol/L (ref 22–32)
Calcium: 9.2 mg/dL (ref 8.9–10.3)
Chloride: 106 mmol/L (ref 98–111)
Creatinine, Ser: 0.67 mg/dL (ref 0.44–1.00)
GFR, Estimated: >60 mL/min (ref >60)
Glucose, Bld: 105 mg/dL — ABNORMAL HIGH (ref 70–99)
Potassium: 3.6 mmol/L (ref 3.5–5.1)
Sodium: 138 mmol/L (ref 135–145)

## 2021-03-31 LAB — CBC
HCT: 41.9 % (ref 36.0–46.0)
Hemoglobin: 14 g/dL (ref 12.0–15.0)
MCH: 28.9 pg (ref 26.0–34.0)
MCHC: 33.4 g/dL (ref 30.0–36.0)
MCV: 86.4 fL (ref 80.0–100.0)
Platelets: 234 10*3/uL (ref 150–400)
RBC: 4.85 MIL/uL (ref 3.87–5.11)
RDW: 12.9 % (ref 11.5–15.5)
WBC: 9.8 10*3/uL (ref 4.0–10.5)
nRBC: 0 % (ref 0.0–0.2)

## 2021-03-31 LAB — RESP PANEL BY RT-PCR (FLU A&B, COVID) ARPGX2
Influenza A by PCR: NEGATIVE
Influenza B by PCR: NEGATIVE
SARS Coronavirus 2 by RT PCR: POSITIVE — AB

## 2021-03-31 LAB — PREGNANCY, URINE: Preg Test, Ur: NEGATIVE

## 2021-03-31 LAB — TROPONIN I (HIGH SENSITIVITY): Troponin I (High Sensitivity): <2 ng/L (ref <18)

## 2021-03-31 NOTE — ED Triage Notes (Signed)
Pt. Reports a week ago she had a bad cough and coughing up clear mucus.  Today approx. 2 hours ago Pt. Reports she started having shortness of breath with chest pain.  Pt. Rates pain in center chest 9/10.  Pt. Sats are 100% on RA.

## 2021-03-31 NOTE — ED Provider Notes (Signed)
[facility] EMERGENCY DEPARTMENT Provider Note   CSN: [id number] Arrival date & time: 03/31/21  1753     History Chief Complaint  Patient presents with   Shortness of Breath    Stefanie Lucero is a 30 y.o. female.  HPI     30yo female with history of asthma, eosinophilic esophagitis, family history of early CAD, who presents with concern for chest pain, dyspnea and palpitations.  Reports having episodes like this since February, has come to ED several times and discussed with PCP who felt it was anxiety. She has not yet seen Cardiology.  Palpitations, dyspnea have been on and off over long period of time.  Had fever to 104 last nweek but not since then. Both she and partner had flu like symptoms.   Dyspnea and chest pain last night Dull ache last night felt like couldn't catch breath Deep breath feels central pain in chest Constant, dulll and sharp pain Has constant dull pain all the time and episodes of sharp pain that come and go over months. Sometimes has episodes of shortness of breath and palpitations, they seem to come on more at night. Pepcid and prilosec  Denies recent sick contacts Diagnosed with EOE in February and has had CP since then Wakes up with palpitations, dyspnea, episodes since February and this episode started last night Diarrhea recently, has had previously Headache behind both ears  Father had MI in 76s No smoking, drugs, occ etoh No esstogren, recent travel, asymmetric leg pain, immobilization/surgery Currently feeling improved  Past Medical History:  Diagnosis Date   Anxiety    Asthma    Eosinophilic esophagitis    Migraine    STD (sexually transmitted disease)    trichomonas treated 2020    Patient Active Problem List   Diagnosis Date Noted   History of trichomoniasis 12/07/2019   IUD (intrauterine device) in place 12/07/2019   Intractable migraine 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 07/05/2016    Past Surgical History:   Procedure Laterality Date   CESAREAN SECTION     x 2   INTRAUTERINE DEVICE (IUD) INSERTION     mirena inserted 12-07-2019   TONSILLECTOMY  2001     OB History     Gravida  2   Para  2   Term  2   Preterm      AB      Living  2      SAB      IAB      Ectopic      Multiple      Live Births  2           Family History  Problem Relation Age of Onset   Hypertension Mother    Heart attack Father        times 2   Hypertension Father    Alzheimer's disease Maternal Grandmother    Irregular heart beat Maternal Grandmother    Stroke Maternal Grandfather    Gallbladder disease Maternal Grandfather    Colon cancer Paternal Grandfather 71            Social History   Tobacco Use   Smoking status: Never   Smokeless tobacco: Never  Substance Use Topics   Alcohol use: Not Currently    Comment: 1-2 every 2 weeks   Drug use: No    Home Medications Prior to Admission medications   Medication Sig Start Date End Date Taking? Authorizing Provider  acetaminophen (TYLENOL) 500 MG  tablet Take 1,000 mg by mouth every 6 (six) hours as needed.    [provider]  albuterol (VENTOLIN HFA) 108 (90 Base) MCG/ACT inhaler Inhale 2 puffs into the lungs every 4 (four) hours as needed. 11/06/20   [provider]  amoxicillin-clavulanate (AUGMENTIN) 875-125 MG tablet Take 1 tablet by mouth every 12 (twelve) hours. 03/05/21   Wallis Bamberg, PA-C  cetirizine (ZYRTEC ALLERGY) 10 MG tablet Take 1 tablet (10 mg total) by mouth daily. 03/05/21   Wallis Bamberg, PA-C  dicyclomine (BENTYL) 20 MG tablet Take 1 tablet (20 mg total) by mouth 2 (two) times daily. 01/26/21   Palumbo, April, MD  famotidine (PEPCID) 20 MG tablet Take 1 tablet (20 mg total) by mouth 2 (two) times daily. 11/12/20   Wieters, Hallie C, PA-C  fluticasone (FLONASE) 50 MCG/ACT nasal spray Place 2 sprays into both nostrils daily. 03/05/21   Wallis Bamberg, PA-C  meclizine (ANTIVERT) 25 MG tablet Take 25 mg by mouth  daily.    [provider]  ondansetron (ZOFRAN ODT) 4 MG disintegrating tablet Take 1 tablet (4 mg total) by mouth every 8 (eight) hours as needed for nausea or vomiting. 01/26/21   Sponseller, Eugene Gavia, PA-C  pseudoephedrine (SUDAFED) 60 MG tablet Take 1 tablet (60 mg total) by mouth every 8 (eight) hours as needed for congestion. 03/05/21   Wallis Bamberg, PA-C  sucralfate (CARAFATE) 1 g tablet Take 1 g by mouth 4 (four) times daily.    [provider]  etonogestrel-ethinyl estradiol (NUVARING) 0.12-0.015 MG/24HR vaginal ring Place 1 each vaginally every 28 (twenty-eight) days. Insert vaginally and leave in place for 3 consecutive weeks, then remove for 1 week. Patient not taking: No sig reported 10/02/20 11/12/20  [name], NP    Allergies    Eggs or egg-derived products, Nutmeg oil (myristica oil), Ceftin [cefuroxime], Guggulipid-black pepper, Iodine, and Nickel  Review of Systems   Review of Systems  Constitutional:  Positive for fatigue. Negative for fever.  HENT:  Negative for sore throat.   Eyes:  Negative for visual disturbance.  Respiratory:  Positive for cough and shortness of breath.   Cardiovascular:  Positive for chest pain and palpitations. Negative for leg swelling.  Gastrointestinal:  Positive for diarrhea. Negative for abdominal pain, nausea and vomiting.  Genitourinary:  Negative for difficulty urinating.  Musculoskeletal:  Negative for back pain and neck pain.  Skin:  Negative for rash.  Neurological:  Negative for syncope and headaches.   Physical Exam Updated Vital Signs BP (!) 109/93   Pulse 74   Temp 98.4 F (36.9 C) (Oral)   Resp 18   Ht 5\' 3"  (1.6 m)   Wt 85.3 kg   LMP 03/31/2021   SpO2 98%   BMI 33.30 kg/m   Physical Exam Vitals and nursing note reviewed.  Constitutional:      General: She is not in acute distress.    Appearance: She is well-developed. She is not diaphoretic.  HENT:     Head: Normocephalic and atraumatic.   Eyes:     Conjunctiva/sclera: Conjunctivae normal.  Cardiovascular:     Rate and Rhythm: Normal rate and regular rhythm.     Heart sounds: Normal heart sounds. No murmur heard.   No friction rub. No gallop.  Pulmonary:     Effort: Pulmonary effort is normal. No respiratory distress.     Breath sounds: Normal breath sounds. No wheezing or rales.  Abdominal:     General: There is no  distension.     Palpations: Abdomen is soft.     Tenderness: There is no abdominal tenderness. There is no guarding.  Musculoskeletal:        General: No tenderness.     Cervical back: Normal range of motion.  Skin:    General: Skin is warm and dry.     Findings: No erythema or rash.  Neurological:     Mental Status: She is alert and oriented to person, place, and time.    ED Results / Procedures / Treatments   Labs (all labs ordered are listed, but only abnormal results are displayed) Labs Reviewed  RESP PANEL BY RT-PCR (FLU A&B, COVID) ARPGX2 - Abnormal; Notable for the following components:      Result Value   SARS Coronavirus 2 by RT PCR POSITIVE (*)    All other components within normal limits  BASIC METABOLIC PANEL - Abnormal; Notable for the following components:   Glucose, Bld 105 (*)    All other components within normal limits  CBC  PREGNANCY, URINE  TSH  TROPONIN I (HIGH SENSITIVITY)    EKG EKG Interpretation  Date/Time:  Tuesday March 31 2021 18:03:10 EDT Ventricular Rate:  96 PR Interval:  144 QRS Duration: 78 QT Interval:  344 QTC Calculation: 434 R Axis:   45 Text Interpretation: Normal sinus rhythm Septal infarct , age undetermined Abnormal ECG No significant change since last tracing Confirmed by Alvira MondaySchlossman, Taten Merrow ([id number]) on 03/31/2021 7:17:42 PM  Radiology DG Chest 2 View  Result Date: 03/31/2021 CLINICAL DATA:  Chest pain.  Cough. EXAM: CHEST - 2 VIEW COMPARISON:  Chest x-ray 02/23/2021. FINDINGS: The heart size and mediastinal contours are within normal limits.  Both lungs are clear. The visualized skeletal structures are unremarkable. IMPRESSION: No active cardiopulmonary disease. Electronically Signed   By: Darliss CheneyAmy  Guttmann M.D.   On: 03/31/2021 19:03    Procedures Procedures   Medications Ordered in ED Medications - No data to display  ED Course  I have reviewed the triage vital signs and the nursing notes.  Pertinent labs & imaging results that were available during my care of the patient were reviewed by me and considered in my medical decision making (see chart for details).    MDM Rules/Calculators/A&P                            30yo female with history of asthma, eosinophilic esophagitis, family history of early CAD, who presents with concern for chest pain, dyspnea and palpitations.    Differential diagnosis for chest pain includes pulmonary embolus, dissection, pneumothorax, pneumonia, ACS, myocarditis, pericarditis.  EKG was done and evaluate by me and showed no acute ST changes and no signs of pericarditis. Chest x-ray was done and evaluated by me and radiology and showed no sign of pneumonia or pneumothorax. Patient is low risk [place] with similar ED presentations in the past as recently as about one month ago with a negative ddimer and have low suspicion for PE.  Patient is low risk HEART score and had negative troponin greater than 6hr after symptom onset which was  negative.  Do not feel history or exam are consistent with aortic dissection.  Normal sinus rhythm in ED.  COVID 19 testing positive, sounds like she had more acute symptoms last week with fever and suspect that was beginning of symptoms---may contribute to fatigue however has had previous similar symptoms and presentation tonight likely not secondary to COVID.  Given repeat episodes, palpitations, feel Cardiology referral is reasonable for monitor. Patient discharged in stable condition with understanding of reasons to return.                  .     Final Clinical Impression(s)  / ED Diagnoses Final diagnoses:  Chest pain, unspecified type  COVID-19  Palpitations    Rx / DC Orders ED Discharge Orders     None        Alvira Monday, MD 04/01/21 [phone]

## 2021-03-31 NOTE — ED Notes (Signed)
Pt ambulatory to bathroom, no assistance needed.  

## 2021-04-01 LAB — TSH: TSH: 1.844 u[IU]/mL (ref 0.350–4.500)

## 2021-04-14 ENCOUNTER — Ambulatory Visit (INDEPENDENT_AMBULATORY_CARE_PROVIDER_SITE_OTHER): Payer: BC Managed Care – PPO | Admitting: Otolaryngology

## 2021-04-16 ENCOUNTER — Ambulatory Visit: Payer: BC Managed Care – PPO | Admitting: Nurse Practitioner

## 2021-04-22 DIAGNOSIS — Z79899 Other long term (current) drug therapy: Secondary | ICD-10-CM | POA: Diagnosis not present

## 2021-04-22 DIAGNOSIS — G43119 Migraine with aura, intractable, without status migrainosus: Secondary | ICD-10-CM | POA: Diagnosis not present

## 2021-04-27 ENCOUNTER — Encounter: Payer: Self-pay | Admitting: Neurology

## 2021-05-06 DIAGNOSIS — F411 Generalized anxiety disorder: Secondary | ICD-10-CM | POA: Diagnosis not present

## 2021-05-06 DIAGNOSIS — M791 Myalgia, unspecified site: Secondary | ICD-10-CM | POA: Diagnosis not present

## 2021-05-06 DIAGNOSIS — K200 Eosinophilic esophagitis: Secondary | ICD-10-CM | POA: Diagnosis not present

## 2021-05-06 DIAGNOSIS — M2550 Pain in unspecified joint: Secondary | ICD-10-CM | POA: Diagnosis not present

## 2021-05-07 ENCOUNTER — Encounter (HOSPITAL_COMMUNITY): Payer: Self-pay | Admitting: Emergency Medicine

## 2021-05-07 ENCOUNTER — Encounter (HOSPITAL_BASED_OUTPATIENT_CLINIC_OR_DEPARTMENT_OTHER): Payer: Self-pay | Admitting: *Deleted

## 2021-05-07 ENCOUNTER — Other Ambulatory Visit: Payer: Self-pay

## 2021-05-07 ENCOUNTER — Emergency Department (HOSPITAL_BASED_OUTPATIENT_CLINIC_OR_DEPARTMENT_OTHER): Payer: BC Managed Care – PPO

## 2021-05-07 ENCOUNTER — Emergency Department (HOSPITAL_BASED_OUTPATIENT_CLINIC_OR_DEPARTMENT_OTHER)
Admission: EM | Admit: 2021-05-07 | Discharge: 2021-05-07 | Disposition: A | Discharge status: Home / Self Care | Payer: BC Managed Care – PPO | Source: Home / Self Care | Attending: Emergency Medicine | Admitting: Emergency Medicine

## 2021-05-07 ENCOUNTER — Emergency Department (HOSPITAL_COMMUNITY)
Admission: EM | Admit: 2021-05-07 | Discharge: 2021-05-07 | Disposition: A | Discharge status: Home / Self Care | Payer: BC Managed Care – PPO | Attending: Emergency Medicine | Admitting: Emergency Medicine

## 2021-05-07 ENCOUNTER — Emergency Department (HOSPITAL_COMMUNITY): Payer: BC Managed Care – PPO

## 2021-05-07 DIAGNOSIS — R0789 Other chest pain: Secondary | ICD-10-CM | POA: Insufficient documentation

## 2021-05-07 DIAGNOSIS — K582 Mixed irritable bowel syndrome: Secondary | ICD-10-CM

## 2021-05-07 DIAGNOSIS — R0602 Shortness of breath: Secondary | ICD-10-CM | POA: Insufficient documentation

## 2021-05-07 DIAGNOSIS — K589 Irritable bowel syndrome without diarrhea: Secondary | ICD-10-CM | POA: Diagnosis not present

## 2021-05-07 DIAGNOSIS — R079 Chest pain, unspecified: Secondary | ICD-10-CM | POA: Insufficient documentation

## 2021-05-07 DIAGNOSIS — J45909 Unspecified asthma, uncomplicated: Secondary | ICD-10-CM | POA: Insufficient documentation

## 2021-05-07 LAB — CBC WITH DIFFERENTIAL/PLATELET
Abs Immature Granulocytes: 0.02 10*3/uL (ref 0.00–0.07)
Basophils Absolute: 0 10*3/uL (ref 0.0–0.1)
Basophils Relative: 0 %
Eosinophils Absolute: 0.1 10*3/uL (ref 0.0–0.5)
Eosinophils Relative: 1 %
HCT: 41.4 % (ref 36.0–46.0)
Hemoglobin: 13.3 g/dL (ref 12.0–15.0)
Immature Granulocytes: 0 %
Lymphocytes Relative: 34 %
Lymphs Abs: 2.6 10*3/uL (ref 0.7–4.0)
MCH: 28.5 pg (ref 26.0–34.0)
MCHC: 32.1 g/dL (ref 30.0–36.0)
MCV: 88.7 fL (ref 80.0–100.0)
Monocytes Absolute: 0.6 10*3/uL (ref 0.1–1.0)
Monocytes Relative: 8 %
Neutro Abs: 4.3 10*3/uL (ref 1.7–7.7)
Neutrophils Relative %: 57 %
Platelets: 275 10*3/uL (ref 150–400)
RBC: 4.67 MIL/uL (ref 3.87–5.11)
RDW: 13.8 % (ref 11.5–15.5)
WBC: 7.7 10*3/uL (ref 4.0–10.5)
nRBC: 0 % (ref 0.0–0.2)

## 2021-05-07 LAB — URINALYSIS, ROUTINE W REFLEX MICROSCOPIC
Bilirubin Urine: NEGATIVE
Glucose, UA: NEGATIVE mg/dL
Ketones, ur: NEGATIVE mg/dL
Leukocytes,Ua: NEGATIVE
Nitrite: NEGATIVE
Protein, ur: NEGATIVE mg/dL
Specific Gravity, Urine: 1.025 (ref 1.005–1.030)
pH: 7 (ref 5.0–8.0)

## 2021-05-07 LAB — BASIC METABOLIC PANEL
Anion gap: 8 (ref 5–15)
BUN: 9 mg/dL (ref 6–20)
CO2: 25 mmol/L (ref 22–32)
Calcium: 9.3 mg/dL (ref 8.9–10.3)
Chloride: 105 mmol/L (ref 98–111)
Creatinine, Ser: 0.7 mg/dL (ref 0.44–1.00)
GFR, Estimated: >60 mL/min (ref >60)
Glucose, Bld: 111 mg/dL — ABNORMAL HIGH (ref 70–99)
Potassium: 4.2 mmol/L (ref 3.5–5.1)
Sodium: 138 mmol/L (ref 135–145)

## 2021-05-07 LAB — PREGNANCY, URINE: Preg Test, Ur: NEGATIVE

## 2021-05-07 LAB — OCCULT BLOOD X 1 CARD TO LAB, STOOL: Fecal Occult Bld: NEGATIVE

## 2021-05-07 LAB — URINALYSIS, MICROSCOPIC (REFLEX): WBC, UA: NONE SEEN WBC/hpf (ref 0–5)

## 2021-05-07 LAB — D-DIMER, QUANTITATIVE: D-Dimer, Quant: <0.27 ug/mL-FEU (ref 0.00–0.50)

## 2021-05-07 LAB — LIPASE, BLOOD: Lipase: 26 U/L (ref 11–51)

## 2021-05-07 LAB — TROPONIN I (HIGH SENSITIVITY): Troponin I (High Sensitivity): <2 ng/L (ref <18)

## 2021-05-07 MED ORDER — SUCRALFATE 1 GM/10ML PO SUSP: 1.0000 g | Freq: Three times a day (TID) | ORAL | 0 refills | Status: DC | PRN

## 2021-05-07 MED ORDER — GLYCERIN (LAXATIVE) 2.1 G RE SUPP
1.0000 | Freq: Once | RECTAL | Status: AC
  Administered 2021-05-07: 1 via RECTAL
  Filled 2021-05-07: qty 1

## 2021-05-07 MED ORDER — DICYCLOMINE HCL 20 MG PO TABS: 20.0000 mg | ORAL_TABLET | Freq: Two times a day (BID) | ORAL | 0 refills | Status: DC

## 2021-05-07 MED ORDER — LACTATED RINGERS IV BOLUS
1000.0000 mL | Freq: Once | INTRAVENOUS | Status: AC
  Administered 2021-05-07: 1000 mL via INTRAVENOUS

## 2021-05-07 NOTE — ED Notes (Signed)
Patient transported to X-ray 

## 2021-05-07 NOTE — ED Notes (Signed)
Pt aware a urine has been ordered, pt states she can not void at this time

## 2021-05-07 NOTE — Discharge Instructions (Addendum)
You were seen in the ER today for your left sided chest pain.  Your physical exam, vital signs, blood work, and chest x-ray were very reassuring as was your EKG.  Suspect that your central chest discomfort is secondary to your eosinophilic esophagitis.  You have been prescribed Carafate to take as needed for flareups.  You declined GI cocktail in the emergency department.  Relating your left breast pain, while the exact cause of your pain remains unclear, there is not appear to be any emergent issue at this time.  Please follow-up with your OB/GYN.  Additionally please follow-up with your gastroenterologist.  Return to the ER with any new severe symptoms.

## 2021-05-07 NOTE — Discharge Instructions (Addendum)
Your symptoms are most consistent with IBS. If continued concern for constipation, recommend a Miralax cleanout with 8 capfuls in a 32 oz gatorade. The remainder of your workup today was reassuring.

## 2021-05-07 NOTE — ED Provider Notes (Signed)
Emergency Medicine Provider Triage Evaluation Note  Stefanie Lucero , a 30 y.o. female  was evaluated in triage.  Pt complains of chest pain x 1 week.  Pain beneath left breast and into axilla.  Slight cough but denies fever.  Some intermittent SOB but none currently.  No hx of DVT or PE.  No exogenous estrogens or OCP's currently.  Review of Systems  Positive: Chest pain Negative: fever  Physical Exam  BP (!) 126/99 (BP Location: Right Arm)   Pulse 80   Temp 98.3 F (36.8 C) (Oral)   Resp 20   SpO2 99%   Gen:   Awake, no distress   Resp:  Normal effort  MSK:   Moves extremities without difficulty  Other:    Medical Decision Making  Medically screening exam initiated at 5:43 AM.  Appropriate orders placed.  Stefanie Lucero was informed that the remainder of the evaluation will be completed by another provider, this initial triage assessment does not replace that evaluation, and the importance of remaining in the ED until their evaluation is complete.  Chest pain.  No tachycardia or hypoxia, not on OCPs/exogenous estrogens.  No hx of DVT or PE. EKG, labs, CXR ordered.   Stefanie Hatchet, PA-C 05/07/21 0544    Stefanie Logan, DO 05/07/21 2340

## 2021-05-07 NOTE — ED Triage Notes (Signed)
Sob, abdominal pain, chest pain. Constipated. She had a EKG in the ER at [facility] this am for same complaint. No appetite.

## 2021-05-07 NOTE — ED Notes (Signed)
Blood recollect sent  & urine

## 2021-05-07 NOTE — ED Triage Notes (Signed)
Pt reports left sided chest pain X 1 week.  Pt is slightly nauseas and sometimes SOB

## 2021-05-07 NOTE — ED Provider Notes (Signed)
[facility] EMERGENCY DEPARTMENT Provider Note   CSN: [id number] Arrival date & time: 05/07/21  3790     History Chief Complaint  Patient presents with   Chest Pain    MARIELIS SAMARA is a 30 y.o. female with history of eosinophilic esophagitis who presents with concern for 1 week of left-sided chest wall pain, chest tightness, and intermittent shortness of breath.  States she woke from her sleep at 4 AM and was experiencing generalized chest pressure feeling like someone was squeezing her chest tightly like a "tight hug" with associated mild nausea and shortness of breath.  No palpitations or chest pain.  I personally reviewed this patient's medical records.  She is history of migraines, anxiety, asthma, and eosinophilic esophagitis.  Patient states she is currently on esomeprazole but is not taking her Zoloft.  As needed sumatriptan.  HPI  HPI: A 30 year old patient with a history of obesity presents for evaluation of chest pain. Initial onset of pain was approximately 3-6 hours ago. The patient's chest pain is described as heaviness/pressure/tightness and is not worse with exertion. The patient's chest pain is middle- or left-sided, is not well-localized, is not sharp and does not radiate to the arms/jaw/neck. The patient does not complain of nausea and denies diaphoresis. The patient has no history of stroke, has no history of peripheral artery disease, has not smoked in the past 90 days, denies any history of treated diabetes, has no relevant family history of coronary artery disease (first degree relative at less than age 57), is not hypertensive and has no history of hypercholesterolemia.   Past Medical History:  Diagnosis Date   Anxiety    Asthma    Eosinophilic esophagitis    Migraine    STD (sexually transmitted disease)    trichomonas treated 2020    Patient Active Problem List   Diagnosis Date Noted   History of trichomoniasis 12/07/2019   IUD  (intrauterine device) in place 12/07/2019   Intractable migraine 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 07/05/2016    Past Surgical History:  Procedure Laterality Date   CESAREAN SECTION     x 2   INTRAUTERINE DEVICE (IUD) INSERTION     mirena inserted 12-07-2019   TONSILLECTOMY  2001     OB History     Gravida  2   Para  2   Term  2   Preterm      AB      Living  2      SAB      IAB      Ectopic      Multiple      Live Births  2           Family History  Problem Relation Age of Onset   Hypertension Mother    Heart attack Father        times 2   Hypertension Father    Alzheimer's disease Maternal Grandmother    Irregular heart beat Maternal Grandmother    Stroke Maternal Grandfather    Gallbladder disease Maternal Grandfather    Colon cancer Paternal Grandfather 80            Social History   Tobacco Use   Smoking status: Never   Smokeless tobacco: Never  Substance Use Topics   Alcohol use: Not Currently    Comment: 1-2 every 2 weeks   Drug use: No    Home Medications Prior to Admission medications   Medication Sig  Start Date End Date Taking? Authorizing Provider  acetaminophen (TYLENOL) 500 MG tablet Take 1,000 mg by mouth every 6 (six) hours as needed for moderate pain or headache.   Yes [provider]  albuterol (VENTOLIN HFA) 108 (90 Base) MCG/ACT inhaler Inhale 2 puffs into the lungs every 4 (four) hours as needed for wheezing or shortness of breath. 11/06/20  Yes [provider]  aspirin EC 81 MG tablet Take 81 mg by mouth every 4 (four) hours as needed for mild pain. Swallow whole.   Yes [provider]  esomeprazole (NEXIUM) 40 MG capsule Take 40 mg by mouth 2 (two) times daily. 04/02/21  Yes [provider]  fluticasone (FLONASE) 50 MCG/ACT nasal spray Place 2 sprays into both nostrils daily. Patient taking differently: Place 2 sprays into both nostrils daily as needed for allergies. 03/05/21  Yes  Wallis Bamberg, PA-C  ibuprofen (ADVIL) 200 MG tablet Take 800 mg by mouth every 6 (six) hours as needed for headache or moderate pain.   Yes [provider]  meclizine (ANTIVERT) 25 MG tablet Take 25 mg by mouth 2 (two) times daily as needed for dizziness.   Yes [provider]  ondansetron (ZOFRAN ODT) 4 MG disintegrating tablet Take 1 tablet (4 mg total) by mouth every 8 (eight) hours as needed for nausea or vomiting. 01/26/21  Yes Cesiah Westley R, PA-C  sucralfate (CARAFATE) 1 GM/10ML suspension Take 10 mLs (1 g total) by mouth 3 (three) times daily as needed. 05/07/21  Yes Deniel Mcquiston, Eugene Gavia, PA-C  SUMAtriptan (IMITREX) 50 MG tablet Take 50 mg by mouth daily as needed for migraine. 04/23/21  Yes [provider]  amoxicillin-clavulanate (AUGMENTIN) 875-125 MG tablet Take 1 tablet by mouth every 12 (twelve) hours. Patient not taking: No sig reported 03/05/21   Wallis Bamberg, PA-C  cetirizine (ZYRTEC ALLERGY) 10 MG tablet Take 1 tablet (10 mg total) by mouth daily. Patient not taking: No sig reported 03/05/21   Wallis Bamberg, PA-C  dicyclomine (BENTYL) 20 MG tablet Take 1 tablet (20 mg total) by mouth 2 (two) times daily. Patient not taking: No sig reported 01/26/21   Palumbo, April, MD  famotidine (PEPCID) 20 MG tablet Take 1 tablet (20 mg total) by mouth 2 (two) times daily. Patient not taking: Reported on 05/07/2021 11/12/20   Wieters, Hallie C, PA-C  pseudoephedrine (SUDAFED) 60 MG tablet Take 1 tablet (60 mg total) by mouth every 8 (eight) hours as needed for congestion. Patient not taking: No sig reported 03/05/21   Wallis Bamberg, PA-C  sertraline (ZOLOFT) 50 MG tablet Take 50 mg by mouth daily. 04/23/21   [provider]  etonogestrel-ethinyl estradiol (NUVARING) 0.12-0.015 MG/24HR vaginal ring Place 1 each vaginally every 28 (twenty-eight) days. Insert vaginally and leave in place for 3 consecutive weeks, then remove for 1 week. Patient not taking: No sig  reported 10/02/20 11/12/20  [name], NP    Allergies    Eggs or egg-derived products, Nutmeg oil (myristica oil), Ceftin [cefuroxime], Guggulipid-black pepper, Iodine, and Nickel  Review of Systems   Review of Systems  Constitutional: Negative.   HENT: Negative.    Eyes: Negative.   Respiratory:  Positive for chest tightness and shortness of breath.   Cardiovascular:  Positive for chest pain. Negative for palpitations and leg swelling.  Gastrointestinal:  Positive for nausea. Negative for constipation, diarrhea and vomiting.  Genitourinary: Negative.   Musculoskeletal: Negative.   Skin: Negative.   Neurological: Negative.   Hematological: Negative.  Physical Exam Updated Vital Signs BP 109/79   Pulse 73   Temp 98.3 F (36.8 C) (Oral)   Resp 14   Ht 5\' 3"  (1.6 m)   Wt 83.9 kg   SpO2 99%   BMI 32.77 kg/m   Physical Exam Vitals and nursing note reviewed.  Constitutional:      Appearance: She is obese. She is not ill-appearing or toxic-appearing.  HENT:     Head: Normocephalic and atraumatic.     Nose: Nose normal.     Mouth/Throat:     Mouth: Mucous membranes are moist.     Pharynx: Oropharynx is clear. Uvula midline. No oropharyngeal exudate or posterior oropharyngeal erythema.     Tonsils: No tonsillar exudate.  Eyes:     General: Lids are normal. Vision grossly intact.        Right eye: No discharge.        Left eye: No discharge.     Extraocular Movements: Extraocular movements intact.     Conjunctiva/sclera: Conjunctivae normal.     Pupils: Pupils are equal, round, and reactive to light.  Neck:     Trachea: Trachea and phonation normal.  Cardiovascular:     Rate and Rhythm: Normal rate and regular rhythm.     Pulses: Normal pulses.     Heart sounds: Normal heart sounds. No murmur heard. Pulmonary:     Effort: Pulmonary effort is normal. No tachypnea, bradypnea, accessory muscle usage, prolonged expiration or respiratory distress.     Breath  sounds: Normal breath sounds. No wheezing or rales.  Chest:     Chest wall: Tenderness present. No mass, lacerations, deformity, swelling, crepitus or edema.    Abdominal:     General: Bowel sounds are normal. There is no distension.     Palpations: Abdomen is soft.     Tenderness: There is no abdominal tenderness. There is no right CVA tenderness, left CVA tenderness, guarding or rebound.  Musculoskeletal:        General: No deformity.     Cervical back: Normal range of motion and neck supple. No edema, rigidity or crepitus. No pain with movement, spinous process tenderness or muscular tenderness.     Right lower leg: No edema.     Left lower leg: No edema.  Lymphadenopathy:     Cervical: No cervical adenopathy.  Skin:    General: Skin is warm and dry.     Capillary Refill: Capillary refill takes less than 2 seconds.  Neurological:     General: No focal deficit present.     Mental Status: She is alert and oriented to person, place, and time. Mental status is at baseline.     GCS: GCS eye subscore is 4. GCS verbal subscore is 5. GCS motor subscore is 6.     Gait: Gait is intact.  Psychiatric:        Mood and Affect: Mood normal.    ED Results / Procedures / Treatments   Labs (all labs ordered are listed, but only abnormal results are displayed) Labs Reviewed  BASIC METABOLIC PANEL - Abnormal; Notable for the following components:      Result Value   Glucose, Bld 111 (*)    All other components within normal limits  CBC WITH DIFFERENTIAL/PLATELET  TROPONIN I (HIGH SENSITIVITY)  TROPONIN I (HIGH SENSITIVITY)    EKG EKG: NSR with sinus arrhythmia.  Radiology DG Chest 2 View  Result Date: 05/07/2021 CLINICAL DATA:  Chest pain. EXAM: CHEST - 2  VIEW COMPARISON:  03/31/2021 FINDINGS: The cardiac silhouette, mediastinal and hilar contours are within normal limits. The lungs are clear. No pleural effusions. No pulmonary lesions. The bony thorax is intact. IMPRESSION: No acute  cardiopulmonary findings. Electronically Signed   By: Rudie Meyer M.D.   On: 05/07/2021 06:26    Procedures Procedures   Medications Ordered in ED Medications - No data to display  ED Course  I have reviewed the triage vital signs and the nursing notes.  Pertinent labs & imaging results that were available during my care of the patient were reviewed by me and considered in my medical decision making (see chart for details).    MDM Rules/Calculators/A&P HEAR Score: 2                       30 year old female who presents with concern for left chest wall pain with associated SOB and N.   Differential diagnosis includes is limited to ACS, PE, pleural fusion, pneumothorax, pneumonia, dysrhythmia, chest wall pain, GERD, gastritis, esophagitis.  Hypertensive on intake, vitals otherwise normal.  Cardiopulmonary exam is well,.exam is benign.  Patient does have left-sided chest wall tenderness palpation along the mid axillary line without skin changes, induration, or erythema to suggest herpes zoster or cellulitis.  CBC unremarkable, BMP unremarkable, troponin negative, less than 2.  Heart score normal 2, patient is PERC negative at this time.  EKG unremarkable, chest x-ray negative for acute cardiopulmonary disease.  Suspect contribution of her known as eosinophilic esophagitis to her central chest discomfort.  Left lateral chest wall pain with unclear etiology, however is not appear to be any emergent cardiopulmonary etiology at this time.  No further work-up warranted needed this time.  Patient already has appointment scheduled for outpatient cardiologist in the next couple of weeks, and will call to schedule appointment with outpatient gastroenterology.  No further work-up warranted in the ER this time.  Ave Filter voiced understanding of his medical evaluation and treatment plan.  Each of her questions was answered to her expressed satisfaction.  Return precautions given.  Patient is  well-appearing, stable, and appropriate for discharge at this time.  This chart was dictated using voice recognition software, Dragon. Despite the best efforts of this provider to proofread and correct errors, errors may still occur which can change documentation meaning.   Final Clinical Impression(s) / ED Diagnoses Final diagnoses:  Atypical chest pain    Rx / DC Orders ED Discharge Orders          Ordered    sucralfate (CARAFATE) 1 GM/10ML suspension  3 times daily PRN        05/07/21 0759             Krystyne Tewksbury, Eugene Gavia, PA-C 05/07/21 5631    Alvira Monday, MD 05/09/21 [phone]

## 2021-05-07 NOTE — ED Provider Notes (Signed)
[facility] EMERGENCY DEPARTMENT Provider Note   CSN: [id number] Arrival date & time: 05/07/21  1940     History Chief Complaint  Patient presents with   Shortness of Breath    Stefanie Lucero is a 30 y.o. female.   Shortness of Breath Associated symptoms: abdominal pain   Associated symptoms: no chest pain, no cough, no ear pain, no fever, no rash, no sore throat and no vomiting    30 year old female with a history of eosinophilic esophagitis, IBS presenting to the emergency department with abdominal pain and chest pain. The patient was seen in the [facility] emergency department earlier today for shortness of breath and chest pain and had a fairly extensive work-up and chest pain rule out.  Work-up consisted of negative EKG, reassuring chest x-ray, troponins x2 negative.  Patient was administered Maalox and felt improved.  Symptoms were considered to be likely due to her eosinophilic esophagitis.  On chart review, the patient recently tested positive for COVID 1 month ago.    The patient presents to the emergency department today due to a complaint of abdominal pain with crampy discomfort and bloating present.  She states that she has a history of IBS and has intermittent constipation and diarrhea.  She said some loose stools earlier this week but has been feeling more constipated recently.  Her last bowel movement was yesterday morning and was normal.  She is passing gas.  She states that 2 weeks ago she had some blood in her stool but has had none since.  Past Medical History:  Diagnosis Date   Anxiety    Asthma    Eosinophilic esophagitis    Migraine    STD (sexually transmitted disease)    trichomonas treated 2020    Patient Active Problem List   Diagnosis Date Noted   History of trichomoniasis 12/07/2019   IUD (intrauterine device) in place 12/07/2019   Intractable migraine 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 07/05/2016    Past Surgical History:  Procedure  Laterality Date   CESAREAN SECTION     x 2   INTRAUTERINE DEVICE (IUD) INSERTION     mirena inserted 12-07-2019   TONSILLECTOMY  2001     OB History     Gravida  2   Para  2   Term  2   Preterm      AB      Living  2      SAB      IAB      Ectopic      Multiple      Live Births  2           Family History  Problem Relation Age of Onset   Hypertension Mother    Heart attack Father        times 2   Hypertension Father    Alzheimer's disease Maternal Grandmother    Irregular heart beat Maternal Grandmother    Stroke Maternal Grandfather    Gallbladder disease Maternal Grandfather    Colon cancer Paternal Grandfather 77            Social History   Tobacco Use   Smoking status: Never   Smokeless tobacco: Never  Vaping Use   Vaping Use: Never used  Substance Use Topics   Alcohol use: Not Currently    Comment: 1-2 every 2 weeks   Drug use: No    Home Medications Prior to Admission medications   Medication Sig Start  Date End Date Taking? Authorizing Provider  dicyclomine (BENTYL) 20 MG tablet Take 1 tablet (20 mg total) by mouth 2 (two) times daily. 05/07/21  Yes Ernie Avena, MD  acetaminophen (TYLENOL) 500 MG tablet Take 1,000 mg by mouth every 6 (six) hours as needed for moderate pain or headache.    [provider]  albuterol (VENTOLIN HFA) 108 (90 Base) MCG/ACT inhaler Inhale 2 puffs into the lungs every 4 (four) hours as needed for wheezing or shortness of breath. 11/06/20   [provider]  amoxicillin-clavulanate (AUGMENTIN) 875-125 MG tablet Take 1 tablet by mouth every 12 (twelve) hours. Patient not taking: No sig reported 03/05/21   Wallis Bamberg, PA-C  aspirin EC 81 MG tablet Take 81 mg by mouth every 4 (four) hours as needed for mild pain. Swallow whole.    [provider]  cetirizine (ZYRTEC ALLERGY) 10 MG tablet Take 1 tablet (10 mg total) by mouth daily. Patient not taking: No sig reported 03/05/21   Wallis Bamberg, PA-C  esomeprazole (NEXIUM) 40 MG capsule Take 40 mg by mouth 2 (two) times daily. 04/02/21   [provider]  famotidine (PEPCID) 20 MG tablet Take 1 tablet (20 mg total) by mouth 2 (two) times daily. Patient not taking: Reported on 05/07/2021 11/12/20   Wieters, Hallie C, PA-C  fluticasone (FLONASE) 50 MCG/ACT nasal spray Place 2 sprays into both nostrils daily. Patient taking differently: Place 2 sprays into both nostrils daily as needed for allergies. 03/05/21   Wallis Bamberg, PA-C  ibuprofen (ADVIL) 200 MG tablet Take 800 mg by mouth every 6 (six) hours as needed for headache or moderate pain.    [provider]  meclizine (ANTIVERT) 25 MG tablet Take 25 mg by mouth 2 (two) times daily as needed for dizziness.    [provider]  ondansetron (ZOFRAN ODT) 4 MG disintegrating tablet Take 1 tablet (4 mg total) by mouth every 8 (eight) hours as needed for nausea or vomiting. 01/26/21   Sponseller, Eugene Gavia, PA-C  pseudoephedrine (SUDAFED) 60 MG tablet Take 1 tablet (60 mg total) by mouth every 8 (eight) hours as needed for congestion. Patient not taking: No sig reported 03/05/21   Wallis Bamberg, PA-C  sertraline (ZOLOFT) 50 MG tablet Take 50 mg by mouth daily. 04/23/21   [provider]  sucralfate (CARAFATE) 1 GM/10ML suspension Take 10 mLs (1 g total) by mouth 3 (three) times daily as needed. 05/07/21   Sponseller, Eugene Gavia, PA-C  SUMAtriptan (IMITREX) 50 MG tablet Take 50 mg by mouth daily as needed for migraine. 04/23/21   [provider]  etonogestrel-ethinyl estradiol (NUVARING) 0.12-0.015 MG/24HR vaginal ring Place 1 each vaginally every 28 (twenty-eight) days. Insert vaginally and leave in place for 3 consecutive weeks, then remove for 1 week. Patient not taking: No sig reported 10/02/20 11/12/20  [name], NP    Allergies    Eggs or egg-derived products, Nutmeg oil (myristica oil), Ceftin [cefuroxime], Guggulipid-black pepper, Iodine, and  Nickel  Review of Systems   Review of Systems  Constitutional:  Negative for chills and fever.  HENT:  Negative for ear pain and sore throat.   Eyes:  Negative for pain and visual disturbance.  Respiratory:  Positive for shortness of breath. Negative for cough.   Cardiovascular:  Negative for chest pain and palpitations.  Gastrointestinal:  Positive for abdominal pain, constipation and diarrhea. Negative for vomiting.  Genitourinary:  Negative for dysuria and hematuria.  Musculoskeletal:  Negative for arthralgias  and back pain.  Skin:  Negative for color change and rash.  Neurological:  Negative for seizures and syncope.  All other systems reviewed and are negative.  Physical Exam Updated Vital Signs BP 118/82   Pulse 73   Temp 98.2 F (36.8 C)   Resp 17   Ht 5\' 3"  (1.6 m)   Wt 83.9 kg   LMP 04/30/2021   SpO2 98%   BMI 32.77 kg/m   Physical Exam Vitals and nursing note reviewed.  Constitutional:      General: She is not in acute distress.    Appearance: She is well-developed.  HENT:     Head: Normocephalic and atraumatic.  Eyes:     Conjunctiva/sclera: Conjunctivae normal.     Pupils: Pupils are equal, round, and reactive to light.  Cardiovascular:     Rate and Rhythm: Normal rate and regular rhythm.     Heart sounds: No murmur heard. Pulmonary:     Effort: Pulmonary effort is normal. No respiratory distress.     Breath sounds: Normal breath sounds.  Abdominal:     General: There is no distension.     Palpations: Abdomen is soft.     Tenderness: There is abdominal tenderness. There is no guarding.     Comments: Mild diffuse tenderness palpation no rebound or guarding  Musculoskeletal:        General: No deformity or signs of injury.     Cervical back: Normal range of motion and neck supple.  Skin:    General: Skin is warm and dry.     Findings: No lesion or rash.  Neurological:     General: No focal deficit present.     Mental Status: She is alert. Mental  status is at baseline.    ED Results / Procedures / Treatments   Labs (all labs ordered are listed, but only abnormal results are displayed) Labs Reviewed  URINALYSIS, ROUTINE W REFLEX MICROSCOPIC - Abnormal; Notable for the following components:      Result Value   Hgb urine dipstick SMALL (*)    All other components within normal limits  URINALYSIS, MICROSCOPIC (REFLEX) - Abnormal; Notable for the following components:   Bacteria, UA RARE (*)    All other components within normal limits  D-DIMER, QUANTITATIVE  PREGNANCY, URINE  LIPASE, BLOOD  OCCULT BLOOD X 1 CARD TO LAB, STOOL    EKG None  Radiology DG Chest 2 View  Result Date: 05/07/2021 CLINICAL DATA:  Chest pain. EXAM: CHEST - 2 VIEW COMPARISON:  03/31/2021 FINDINGS: The cardiac silhouette, mediastinal and hilar contours are within normal limits. The lungs are clear. No pleural effusions. No pulmonary lesions. The bony thorax is intact. IMPRESSION: No acute cardiopulmonary findings. Electronically Signed   By: 05/31/2021 M.D.   On: 05/07/2021 06:26   DG Abdomen Acute W/Chest  Result Date: 05/07/2021 CLINICAL DATA:  Shortness of breath EXAM: DG ABDOMEN ACUTE WITH 1 VIEW CHEST COMPARISON:  None. FINDINGS: There is no evidence of dilated bowel loops or free intraperitoneal air. No radiopaque calculi or other significant radiographic abnormality is seen. Heart size and mediastinal contours are within normal limits. Both lungs are clear. IMPRESSION: Negative abdominal radiographs.  No acute cardiopulmonary disease. Electronically Signed   By: 05/09/2021 M.D.   On: 05/07/2021 23:00    Procedures Procedures   Medications Ordered in ED Medications  Glycerin (Adult) 2.1 g suppository 1 suppository (1 suppository Rectal Given 05/07/21 2147)  lactated ringers bolus 1,000 mL (  0 mLs Intravenous Stopped 05/07/21 2307)    ED Course  I have reviewed the triage vital signs and the nursing notes.  Pertinent labs & imaging  results that were available during my care of the patient were reviewed by me and considered in my medical decision making (see chart for details).    MDM Rules/Calculators/A&P                           30 year old female with a history of eosinophilic esophagitis and IBS who presents to the emergency department with a complaint primarily of abdominal pain.  She did endorse some chest pain and shortness of breath and had a fairly comprehensive evaluation for this earlier today to include negative delta troponins, chest x-ray, EKG.  Her symptoms are most consistent with ongoing issues related to her eosinophilic esophagitis.   The patient endorses crampy abdominal discomfort and had diffuse tenderness to palpation on exam.  She has had intermittent diarrhea and bouts of constipation.  She feels constipated.  She states that her last bowel movement was yesterday and was normal and formed.  She endorses passage of gas and feels bloated.  Her symptoms are most consistent with IBS.  On exam was soft, mildly diffusely tender, nondistended, no rebound or guarding.  No focality to her tenderness.  Low for acute intra-abdominal emergency at this time.  No dysuria.  No fever or chills.  Urinalysis performed with no evidence of UTI, urine pregnancy negative, fecal occult blood collected given the patient's recent concern for hematochezia in the last 2 weeks and negative.  No evidence of anal fissure or hemorrhoids.  KUB was performed which revealed a nonobstructive bowel gas pattern.  No acute cardiopulmonary disease.  A lipase was normal and a D-dimer also resulted negative.  The patient's symptoms are most consistent with eosinophilic esophagitis in addition to her known IBS.  A glycerin suppository was provided for complaint of constipation.  Advised MiraLAX bowel cleanout as needed for constipation.  Advised dental which was prescribed to her pharmacy for her abdominal discomfort and bloating.  Recommended  follow-up with her PCP outpatient.  The patient has been appropriately medically screened and/or stabilized in the ED. I have low suspicion for any other emergent medical condition which would require further screening, evaluation or treatment in the ED or require inpatient management.  Final Clinical Impression(s) / ED Diagnoses Final diagnoses:  Irritable bowel syndrome with both constipation and diarrhea    Rx / DC Orders ED Discharge Orders          Ordered    dicyclomine (BENTYL) 20 MG tablet  2 times daily        05/07/21 2324             Ernie Avena, MD 05/08/21 1533

## 2021-05-20 ENCOUNTER — Other Ambulatory Visit: Payer: Self-pay

## 2021-05-20 ENCOUNTER — Encounter (HOSPITAL_BASED_OUTPATIENT_CLINIC_OR_DEPARTMENT_OTHER): Payer: Self-pay | Admitting: Emergency Medicine

## 2021-05-20 ENCOUNTER — Emergency Department (HOSPITAL_BASED_OUTPATIENT_CLINIC_OR_DEPARTMENT_OTHER)
Admission: EM | Admit: 2021-05-20 | Discharge: 2021-05-20 | Disposition: A | Discharge status: Home / Self Care | Payer: BC Managed Care – PPO | Attending: Emergency Medicine | Admitting: Emergency Medicine

## 2021-05-20 DIAGNOSIS — R0789 Other chest pain: Secondary | ICD-10-CM | POA: Diagnosis not present

## 2021-05-20 DIAGNOSIS — Z5321 Procedure and treatment not carried out due to patient leaving prior to being seen by health care provider: Secondary | ICD-10-CM | POA: Insufficient documentation

## 2021-05-20 NOTE — ED Notes (Signed)
Pt reported to registration clerk that she was leaving  

## 2021-05-20 NOTE — ED Triage Notes (Signed)
Pt is c/o pain to her left chest wall  Pt states it has been hurting for a couple months but has been worse for the past two days  pt states movement makes it hurt worse   Pt was seen on Oct 13th and had an EKG done for same and had a chest xray done at that time as well

## 2021-05-22 ENCOUNTER — Telehealth: Payer: Self-pay | Admitting: *Deleted

## 2021-05-22 ENCOUNTER — Other Ambulatory Visit: Payer: Self-pay

## 2021-05-22 ENCOUNTER — Encounter: Payer: Self-pay | Admitting: Nurse Practitioner

## 2021-05-22 ENCOUNTER — Ambulatory Visit (INDEPENDENT_AMBULATORY_CARE_PROVIDER_SITE_OTHER): Payer: BC Managed Care – PPO | Admitting: Nurse Practitioner

## 2021-05-22 VITALS — BP 136/84

## 2021-05-22 DIAGNOSIS — N644 Mastodynia: Secondary | ICD-10-CM | POA: Diagnosis not present

## 2021-05-22 NOTE — Telephone Encounter (Signed)
Appointment  06/10/2021 @ 1:40pm  Left message for pt to call us so I can give her the appointment date of her ultrasound with [facility].

## 2021-05-22 NOTE — Telephone Encounter (Signed)
-----   Message from Olivia Mackie, NP sent at 05/22/2021  3:45 PM EDT ----- Please send referral for left breast ultrasound for pain. Thank you.

## 2021-05-22 NOTE — Progress Notes (Signed)
   Acute Office Visit  Subjective:    Patient ID: Stefanie Lucero, female    DOB: 23-Oct-1990, 30 y.o.   MRN: [id number]   HPI 30 y.o. presents today for left breast pain. Pain began about 3 months ago, is getting worse, is sharp/shooting, occurs about every other day, nothing makes it worse and nothing makes it better. She does not know of an injury that could have occurred. She thought it was her GERD and has been seen by GI who recommended GYN and cardiology evaluation. She has cardiology appointment next week. She denies pain with inspiration or shortness of breath. Denies injury, swelling, redness, lumps, or nipple discharge.    Review of Systems  Constitutional: Negative.   Hematological:  Negative for adenopathy.  Left breast: Positive for pain. Negative for swelling, redness, lump, or discharge Right breast: Negative    Objective:    Physical Exam Constitutional:      Appearance: Normal appearance.  Chest:  Breasts:    Right: Normal.     Left: Tenderness present. No inverted nipple, mass, nipple discharge or skin change.      BP 136/84   LMP 05/08/2021 (Approximate)  Wt Readings from Last 3 Encounters:  05/20/21 180 lb (81.6 kg)  05/07/21 184 lb 15.5 oz (83.9 kg)  05/07/21 185 lb (83.9 kg)        Assessment & Plan:   Problem List Items Addressed This Visit   None Visit Diagnoses     Pain of left breast    -  Primary      Plan: Will send referral for left breast ultrasound for further evaluation. Continue Tylenol for pain, wear supportive but not too tight sports bras, and support breast at night since it is causing pain with lying. She is agreeable to plan.      Olivia Mackie DNP, 3:44 PM 05/22/2021

## 2021-05-25 ENCOUNTER — Other Ambulatory Visit: Payer: Self-pay

## 2021-05-25 ENCOUNTER — Ambulatory Visit
Admission: EM | Admit: 2021-05-25 | Discharge: 2021-05-25 | Disposition: A | Discharge status: Home / Self Care | Payer: BC Managed Care – PPO | Attending: Emergency Medicine | Admitting: Emergency Medicine

## 2021-05-25 DIAGNOSIS — K200 Eosinophilic esophagitis: Secondary | ICD-10-CM | POA: Diagnosis not present

## 2021-05-25 MED ORDER — FLUTICASONE PROPIONATE HFA 220 MCG/ACT IN AERO: INHALATION_SPRAY | RESPIRATORY_TRACT | 2 refills | Status: DC

## 2021-05-25 MED ORDER — FAMOTIDINE 40 MG PO TABS: 40.0000 mg | ORAL_TABLET | Freq: Every day | ORAL | 2 refills | Status: DC

## 2021-05-25 MED ORDER — LANSOPRAZOLE 30 MG PO CPDR: 30.0000 mg | DELAYED_RELEASE_CAPSULE | Freq: Two times a day (BID) | ORAL | 2 refills | Status: DC

## 2021-05-25 NOTE — ED Triage Notes (Signed)
Pt reports having pain to left upper breast that radiates to left upper chest, pt states pain is worse when lying down. She reports having an appt scheduled with her GYN.   Started: 2 months ago

## 2021-05-25 NOTE — Discharge Instructions (Addendum)
Based on the very detailed history you provided me along with my physical exam findings, I believe that your eosinophilic esophagitis is causing you to have inflammation in the lymph nodes that you have your left arm and around her left breast.  Please switch Zoloft and Cymbalta tonight, you can add Zoloft later once you are tolerating the Cymbalta well and if you still feel like your anxiety, possible depression is still present once your pain is well relieved.   I absolutely believe that you should have the breast ultrasound to rule out anything related to breast tissue.  You will be notified of the results of your CBC and eosinophil count once they are complete, this will be good information to take with you to your GI appointment.  While you are awaiting your appointment with gastroenterology, please consider changing Nexium to Prevacid twice daily and increasing your dose of famotidine to 40 mg every evening.    Per guidelines, antihistamine such as Zyrtec do not play a meaningful role in treatment.  Also per guidelines, in patients who do not have resolution of symptoms with medication such as Nexium and Prevacid, topical corticosteroids are recommended.  As you can imagine, this is difficult to achieve given that we cannot massage steroid cream into the esophagus so the method of deliver of this medication is through using a corticosteroid pump inhaler called fluticasone.    Keep in mind that you will not be inhaling this medication .  Instead, you will  spray the medication into your mouth and swallow, 2 sprays/swallows in the morning and 2 sprays/swallows in the evening.  It is important that you do not eat or drink anything for 30 minutes after these treatments although it is okay to continue to swallow your saliva as you are able.  It is also important to completely rinse your mouth and brush her teeth 30 minutes after treatments to prevent thrush.  Prescriptions for famotidine, fluticasone  and lansoprazole have been sent to your pharmacy.  Thank you for visiting urgent care today, I wish you good luck in your journey towards official diagnosis and treatment, resolution of your symptoms.

## 2021-05-25 NOTE — ED Provider Notes (Signed)
[facility]    CSN: [id number] Arrival date & time: 05/25/21  [id number]      History   Chief Complaint Chief Complaint  Patient presents with   Breast Pain    HPI [name] is a 30 y.o. female.   Patient complains of pain beneath her left arm that radiates out to above her breast and around her left breast and a C shape distribution.  Patient reports a history of eosinophilic esophagitis, GERD, possible fibromyalgia, anxiety and depression.  Patient states her mother suffers from fibromyalgia, takes Cymbalta daily.  Patient states about 2 months ago, she was started on Nexium for GERD symptoms and also advised to take famotidine 20 mg at night.  Patient states she did not notice any improvement in her reflux however has noticed that it is less acidic.  Patient states she is had 2 EGDs, the first 1 showed 8 healing gastric ulcers, the second 1 was biopsy test to confirm EOE.    Patient states that her primary care provider started her on Cymbalta and Zoloft about 2 weeks ago, patient states she is only started Zoloft at this time, states she was not comfortable taking both together due to some information she read on the Internet.  Patient states that since she started taking Zoloft, the pain beneath her left arm is gotten worse.    Patient states that she saw her gynecologist 3 days ago who recommended that she have an ultrasound of her left breast but states that it is scheduled 2 weeks from now.  Patient states last night her pain kept her awake at night, states she came in today to see if there was anything she could do to relieve her pain while she is waiting for the ultrasound.  Patient denies fever, aches, chills, shortness of breath, choking sensation at night, difficulty swallowing, globus sensation, history of asthma, allergies.  Patient states she has not been on any other PPIs for her GERD symptoms, has been given sulcal fate in the past but this gives her a strange  throat closing sensation when she takes it.  Patient states she has been on Zyrtec in the past however did not take it regularly.  The history is provided by the patient.   Past Medical History:  Diagnosis Date   Anxiety    Anxiety    Asthma    Eosinophilic esophagitis    Gastric ulcer    Gastritis    Headaches, cluster    Migraine    Migraine headache    Pancreatic mass    STD (sexually transmitted disease)    trichomonas treated 2020    Patient Active Problem List   Diagnosis Date Noted   History of trichomoniasis 12/07/2019   IUD (intrauterine device) in place 12/07/2019   Intractable migraine 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 07/05/2016    Past Surgical History:  Procedure Laterality Date   CESAREAN SECTION     x 2   INTRAUTERINE DEVICE (IUD) INSERTION     mirena inserted 12-07-2019   TONSILLECTOMY  2001    OB History     Gravida  2   Para  2   Term  2   Preterm      AB      Living  2      SAB      IAB      Ectopic      Multiple      Live Births  2  Home Medications    Prior to Admission medications   Medication Sig Start Date End Date Taking? Authorizing Provider  famotidine (PEPCID) 40 MG tablet Take 1 tablet (40 mg total) by mouth daily. 05/25/21 08/23/21 Yes Theadora Rama Scales, PA-C  fluticasone (FLOVENT HFA) 220 MCG/ACT inhaler Spray medication into mouth then swallow.  Repeat for total of 2 sprays.  Do not inhale medication.  Do not eat or drink for 30 minutes after administering medication.  It is okay to continue swallowing saliva during this 30 minutes.  Perform this treatment twice daily until follow-up with gastroenterology. 05/25/21  Yes Theadora Rama Scales, PA-C  lansoprazole (PREVACID) 30 MG capsule Take 1 capsule (30 mg total) by mouth 2 (two) times daily before a meal. 05/25/21 08/23/21 Yes Theadora Rama Scales, PA-C  acetaminophen (TYLENOL) 500 MG tablet Take 1,000 mg by mouth every 6 (six) hours as  needed for moderate pain or headache.    [provider]  albuterol (VENTOLIN HFA) 108 (90 Base) MCG/ACT inhaler Inhale 2 puffs into the lungs every 4 (four) hours as needed for wheezing or shortness of breath. 11/06/20   [provider]  ibuprofen (ADVIL) 200 MG tablet Take 800 mg by mouth every 6 (six) hours as needed for headache or moderate pain.    [provider]  meclizine (ANTIVERT) 25 MG tablet Take 25 mg by mouth 2 (two) times daily as needed for dizziness.    [provider]  ondansetron (ZOFRAN ODT) 4 MG disintegrating tablet Take 1 tablet (4 mg total) by mouth every 8 (eight) hours as needed for nausea or vomiting. 01/26/21   Sponseller, Lupe Carney R, PA-C  sertraline (ZOLOFT) 50 MG tablet Take 50 mg by mouth daily. 04/23/21   [provider]  SUMAtriptan (IMITREX) 50 MG tablet Take 50 mg by mouth daily as needed for migraine. 04/23/21   [provider]  etonogestrel-ethinyl estradiol (NUVARING) 0.12-0.015 MG/24HR vaginal ring Place 1 each vaginally every 28 (twenty-eight) days. Insert vaginally and leave in place for 3 consecutive weeks, then remove for 1 week. Patient not taking: No sig reported 10/02/20 11/12/20  Olivia Mackie, NP    Family History Family History  Problem Relation Age of Onset   Hypertension Mother    Heart attack Father        times 2   Hypertension Father    Alzheimer's disease Maternal Grandmother    Irregular heart beat Maternal Grandmother    Stroke Maternal Grandfather    Gallbladder disease Maternal Grandfather    Colon cancer Paternal Grandfather 64            Social History Social History   Tobacco Use   Smoking status: Never   Smokeless tobacco: Never  Vaping Use   Vaping Use: Never used  Substance Use Topics   Alcohol use: Yes    Comment: occ   Drug use: No     Allergies   Eggs or egg-derived products, Nutmeg oil (myristica oil), Cefdinir, Ceftin [cefuroxime], Guggulipid-black  pepper, Iodine, and Nickel   Review of Systems Review of Systems Pertinent findings noted in history of present illness.    Physical Exam Triage Vital Signs ED Triage Vitals  Enc Vitals Group     BP 05/22/21 0827 (!) 147/82     Pulse Rate 05/22/21 0827 72     Resp 05/22/21 0827 18     Temp 05/22/21 0827 98.3 F (36.8 C)     Temp Source 05/22/21 0827 Oral  SpO2 05/22/21 0827 98 %     Weight --      Height --      Head Circumference --      Peak Flow --      Pain Score 05/22/21 0826 5     Pain Loc --      Pain Edu? --      Excl. in [place]? --    No data found.  Updated Vital Signs BP 127/88 (BP Location: Right Arm)   Pulse 82   Temp 98.3 F (36.8 C) (Oral)   Resp 18   LMP 05/08/2021 (Approximate)   SpO2 97%   Visual Acuity Right Eye Distance:   Left Eye Distance:   Bilateral Distance:    Right Eye Near:   Left Eye Near:    Bilateral Near:     Physical Exam Vitals and nursing note reviewed.  Constitutional:      General: She is not in acute distress.    Appearance: Normal appearance. She is not ill-appearing.  HENT:     Head: Normocephalic and atraumatic.  Eyes:     General: Lids are normal.        Right eye: No discharge.        Left eye: No discharge.     Extraocular Movements: Extraocular movements intact.     Conjunctiva/sclera: Conjunctivae normal.     Right eye: Right conjunctiva is not injected.     Left eye: Left conjunctiva is not injected.  Neck:     Trachea: Trachea and phonation normal.  Cardiovascular:     Rate and Rhythm: Normal rate and regular rhythm.     Pulses: Normal pulses.     Heart sounds: Normal heart sounds. No murmur heard.   No friction rub. No gallop.  Pulmonary:     Effort: Pulmonary effort is normal. No accessory muscle usage, prolonged expiration or respiratory distress.     Breath sounds: Normal breath sounds. No stridor, decreased air movement or transmitted upper airway sounds. No decreased breath sounds, wheezing,  rhonchi or rales.  Chest:     Chest wall: No tenderness.  Breasts:    Left: Normal. No swelling, mass, nipple discharge, skin change or tenderness.  Musculoskeletal:        General: Normal range of motion.     Cervical back: Normal range of motion and neck supple. Normal range of motion.  Lymphadenopathy:     Cervical: No cervical adenopathy.     Upper Body:     Left upper body: Axillary adenopathy and pectoral adenopathy present.  Skin:    General: Skin is warm and dry.     Findings: No erythema or rash.  Neurological:     General: No focal deficit present.     Mental Status: She is alert and oriented to person, place, and time.  Psychiatric:        Mood and Affect: Mood normal.        Behavior: Behavior normal.     UC Treatments / Results  Labs (all labs ordered are listed, but only abnormal results are displayed) Labs Reviewed  CBC WITH DIFFERENTIAL/PLATELET   Narrative:    Performed at:  [facility] [address], [place], [place]  [id number] Lab Director: Rush Farmer MD, Phone:  [phone]    EKG   Radiology No results found.  Procedures Procedures (including critical care time)  Medications Ordered in UC Medications - No data to display  Initial Impression / Assessment  and Plan / UC Course  I have reviewed the triage vital signs and the nursing notes.  Pertinent labs & imaging results that were available during my care of the patient were reviewed by me and considered in my medical decision making (see chart for details).     Worsening eosinophilic esophagitis, CBC performed today.  Change PPI agent, added famotidine, keep appointment for neurology.  Okay to have breast ultrasound done to rule out any more sinister causes for lymph node enlargement.  Begin oral therapy with fluticasone propionate swallowed instead of spray.  Instructions provided.  Patient verbalized understanding and agreement of plan as discussed.  All questions were  addressed during visit.  Please see discharge instructions below for further details of plan.  Final Clinical Impressions(s) / UC Diagnoses   Final diagnoses:  Eosinophilic esophagitis     Discharge Instructions      Based on the very detailed history you provided me along with my physical exam findings, I believe that your eosinophilic esophagitis is causing you to have inflammation in the lymph nodes that you have your left arm and around her left breast.  Please switch Zoloft and Cymbalta tonight, you can add Zoloft later once you are tolerating the Cymbalta well and if you still feel like your anxiety, possible depression is still present once your pain is well relieved.   I absolutely believe that you should have the breast ultrasound to rule out anything related to breast tissue.  You will be notified of the results of your CBC and eosinophil count once they are complete, this will be good information to take with you to your GI appointment.  While you are awaiting your appointment with gastroenterology, please consider changing Nexium to Prevacid twice daily and increasing your dose of famotidine to 40 mg every evening.    Per guidelines, antihistamine such as Zyrtec do not play a meaningful role in treatment.  Also per guidelines, in patients who do not have resolution of symptoms with medication such as Nexium and Prevacid, topical corticosteroids are recommended.  As you can imagine, this is difficult to achieve given that we cannot massage steroid cream into the esophagus so the method of deliver of this medication is through using a corticosteroid pump inhaler called fluticasone.    Keep in mind that you will not be inhaling this medication .  Instead, you will  spray the medication into your mouth and swallow, 2 sprays/swallows in the morning and 2 sprays/swallows in the evening.  It is important that you do not eat or drink anything for 30 minutes after these treatments although  it is okay to continue to swallow your saliva as you are able.  It is also important to completely rinse your mouth and brush her teeth 30 minutes after treatments to prevent thrush.  Prescriptions for famotidine, fluticasone and lansoprazole have been sent to your pharmacy.  Thank you for visiting urgent care today, I wish you good luck in your journey towards official diagnosis and treatment, resolution of your symptoms.     ED Prescriptions     Medication Sig Dispense Auth. Provider   lansoprazole (PREVACID) 30 MG capsule Take 1 capsule (30 mg total) by mouth 2 (two) times daily before a meal. 60 capsule Theadora Rama Scales, PA-C   famotidine (PEPCID) 40 MG tablet Take 1 tablet (40 mg total) by mouth daily. 30 tablet Theadora Rama Scales, PA-C   fluticasone (FLOVENT HFA) 220 MCG/ACT inhaler Spray medication into mouth then swallow.  Repeat for total of 2 sprays.  Do not inhale medication.  Do not eat or drink for 30 minutes after administering medication.  It is okay to continue swallowing saliva during this 30 minutes.  Perform this treatment twice daily until follow-up with gastroenterology. 1 each Lynden Oxford Scales, PA-C      PDMP not reviewed this encounter.    Lynden Oxford Scales, PA-C 05/26/21 1719

## 2021-05-26 LAB — CBC WITH DIFFERENTIAL/PLATELET
Basophils Absolute: 0 10*3/uL (ref 0.0–0.2)
Basos: 0 %
EOS (ABSOLUTE): 0 10*3/uL (ref 0.0–0.4)
Eos: 1 %
Hematocrit: 40.6 % (ref 34.0–46.6)
Hemoglobin: 13.2 g/dL (ref 11.1–15.9)
Immature Grans (Abs): 0 10*3/uL (ref 0.0–0.1)
Immature Granulocytes: 0 %
Lymphocytes Absolute: 1.8 10*3/uL (ref 0.7–3.1)
Lymphs: 28 %
MCH: 28.3 pg (ref 26.6–33.0)
MCHC: 32.5 g/dL (ref 31.5–35.7)
MCV: 87 fL (ref 79–97)
Monocytes Absolute: 0.5 10*3/uL (ref 0.1–0.9)
Monocytes: 8 %
Neutrophils Absolute: 4 10*3/uL (ref 1.4–7.0)
Neutrophils: 63 %
Platelets: 247 10*3/uL (ref 150–450)
RBC: 4.66 x10E6/uL (ref 3.77–5.28)
RDW: 13.9 % (ref 11.7–15.4)
WBC: 6.3 10*3/uL (ref 3.4–10.8)

## 2021-05-27 NOTE — Progress Notes (Signed)
Cardiology Office Note:    Date:  05/29/2021   ID:  Stefanie Lucero, DOB September 06, 1990, MRN [id number]  PCP:  Drema Halon, FNP   [facility] Providers Cardiologist:  None     Referring MD: Lorenda Ishihara,*   History of Present Illness:    Stefanie Lucero is a 30 y.o. female here for the evaluation of palpitations.  This past February she was diagnosed with eosinophilic esophagitis and asthma. Since then she has intermittently presented to the ED with concerns for ongoing, intermittent episodes of chest pain, dyspnea, and palpitations.  Today, In the past month her chest pain has been worsening. Her pain occurs mostly in her central chest, and feels like a "stabbing" pain. Typically her chest pain occurs when she wakes up in the morning or when lying down at night. She will also wake up in the middle of the night unable to breathe. When using the restroom after waking up at night, she notices that her heart rate often spikes very quickly.  In the past 2 months she has lost about 30 lbs due to loss of appetite and abdominal pain. She endorses severe GERD, and will follow-up with her GI 06/25/2021. In 08/2020 she was found to have 9 gastrointestinal ulcers.  For activity she knows she does not exercise like she should.  She is not a smoker. Her father has CHF and had 2 heart attacks, 1 stroke.  She denies any lightheadedness, headaches, syncope, orthopnea, or lower extremity edema.  Past Medical History:  Diagnosis Date   Anxiety    Anxiety    Asthma    Eosinophilic esophagitis    Gastric ulcer    Gastritis    Headaches, cluster    Migraine    Migraine headache    Pancreatic mass    STD (sexually transmitted disease)    trichomonas treated 2020    Past Surgical History:  Procedure Laterality Date   CESAREAN SECTION     x 2   INTRAUTERINE DEVICE (IUD) INSERTION     mirena inserted 12-07-2019   TONSILLECTOMY  2001    Current Medications: Current Meds   Medication Sig   acetaminophen (TYLENOL) 500 MG tablet Take 1,000 mg by mouth every 6 (six) hours as needed for moderate pain or headache.   albuterol (VENTOLIN HFA) 108 (90 Base) MCG/ACT inhaler Inhale 2 puffs into the lungs every 4 (four) hours as needed for wheezing or shortness of breath.   famotidine (PEPCID) 40 MG tablet Take 1 tablet (40 mg total) by mouth daily.   fluticasone (FLOVENT HFA) 220 MCG/ACT inhaler Spray medication into mouth then swallow.  Repeat for total of 2 sprays.  Do not inhale medication.  Do not eat or drink for 30 minutes after administering medication.  It is okay to continue swallowing saliva during this 30 minutes.  Perform this treatment twice daily until follow-up with gastroenterology.   ibuprofen (ADVIL) 200 MG tablet Take 800 mg by mouth every 6 (six) hours as needed for headache or moderate pain.   lansoprazole (PREVACID) 30 MG capsule Take 1 capsule (30 mg total) by mouth 2 (two) times daily before a meal.   meclizine (ANTIVERT) 25 MG tablet Take 25 mg by mouth 2 (two) times daily as needed for dizziness.   ondansetron (ZOFRAN ODT) 4 MG disintegrating tablet Take 1 tablet (4 mg total) by mouth every 8 (eight) hours as needed for nausea or vomiting.   sertraline (ZOLOFT) 50 MG tablet Take 50 mg  by mouth daily.   SUMAtriptan (IMITREX) 50 MG tablet Take 50 mg by mouth daily as needed for migraine.     Allergies:   Eggs or egg-derived products, Nutmeg oil (myristica oil), Cefdinir, Ceftin [cefuroxime], Guggulipid-black pepper, Iodine, and Nickel   Social History   Socioeconomic History   Marital status: Single    Spouse name: Not on file   Number of children: Not on file   Years of education: Not on file   Highest education level: Not on file  Occupational History   Not on file  Tobacco Use   Smoking status: Never   Smokeless tobacco: Never  Vaping Use   Vaping Use: Never used  Substance and Sexual Activity   Alcohol use: Yes    Comment: occ    Drug use: No   Sexual activity: Not Currently    Partners: Male    Birth control/protection: Condom    Comment: 1st intercourse- 54, partners- 5, current partner- 4 yrs   Other Topics Concern   Not on file  Social History Narrative   Not on file   Social Determinants of Health   Financial Resource Strain: Not on file  Food Insecurity: Not on file  Transportation Needs: Not on file  Physical Activity: Not on file  Stress: Not on file  Social Connections: Not on file     Family History: The patient's family history includes Alzheimer's disease in her maternal grandmother; Colon cancer (age of onset: 62) in her paternal grandfather; Gallbladder disease in her maternal grandfather; Heart attack in her father; Hypertension in her father and mother; Irregular heart beat in her maternal grandmother; Stroke in her maternal grandfather.  ROS:   Please see the history of present illness.    (+) Central chest pain (+) PND (+) Loss of appetite (+) Abdominal pain (+) Acid reflux All other systems reviewed and are negative.  EKGs/Labs/Other Studies Reviewed:    The following studies were reviewed today:  Left LE Venous Doppler 10/09/2020: COMPARISON:  None   FINDINGS: VENOUS   Normal compressibility of the common femoral, superficial femoral, and popliteal veins, as well as the visualized calf veins. Visualized portions of profunda femoral vein and great saphenous vein unremarkable. No filling defects to suggest DVT on grayscale or color Doppler imaging. Doppler waveforms show normal direction of venous flow, normal respiratory plasticity and response to augmentation.   Limited views of the contralateral common femoral vein are unremarkable.   OTHER   None.   Limitations: none   IMPRESSION: Negative sonographic assessment of the LEFT lower extremity for DVT.  EKG:  EKG is personally reviewed and interpreted. 05/29/2021: EKG was not ordered today.  Recent  Labs: 10/09/2020: Magnesium 2.0 02/23/2021: ALT 18 03/31/2021: TSH 1.844 05/07/2021: BUN 9; Creatinine, Ser 0.70; Potassium 4.2; Sodium 138 05/25/2021: Hemoglobin 13.2; Platelets 247   Recent Lipid Panel No results found for: CHOL, TRIG, HDL, CHOLHDL, VLDL, LDLCALC, LDLDIRECT       Physical Exam:    VS:  BP 100/80 (BP Location: Left Arm, Patient Position: Sitting, Cuff Size: Normal)   Pulse 94   Ht 5\' 3"  (1.6 m)   Wt 180 lb (81.6 kg)   LMP 05/08/2021 (Approximate)   SpO2 98%   BMI 31.89 kg/m     Wt Readings from Last 3 Encounters:  05/29/21 180 lb (81.6 kg)  05/20/21 180 lb (81.6 kg)  05/07/21 184 lb 15.5 oz (83.9 kg)     GEN: Well nourished, well developed in no  acute distress HEENT: Normal NECK: No JVD; No carotid bruits LYMPHATICS: No lymphadenopathy CARDIAC: RRR, no murmurs, rubs, gallops RESPIRATORY:  Clear to auscultation without rales, wheezing or rhonchi  ABDOMEN: Soft, non-tender, non-distended MUSCULOSKELETAL:  No edema; No deformity  SKIN: Warm and dry NEUROLOGIC:  Alert and oriented x 3 PSYCHIATRIC:  Normal affect   ASSESSMENT:    1. Shortness of breath   2. Chest pain of uncertain etiology   3. Palpitations    PLAN:    In order of problems listed above:  Chest pain of uncertain etiology Checking echocardiogram to ensure that she has normal structure and function of her heart and no pericardial effusion or inflammation.  By description, sharp central chest discomfort with prior diagnosis of gastric ulcers, eosinophilic esophagitis point towards GI condition as leading culprit for chest pain.  She is continuing with proton pump inhibitor as well as Pepcid at night.  Dietary modifications.  Overall, ER visit reviewed and there were negative troponin, normal EKG.  Reassurance.  Palpitations Checking Zio patch monitor.  She reports rapid heart rates at times.  Want to make sure that there are no adverse arrhythmias present.  Shortness of breath Minimal  sensation with activity.  Checking echocardiogram.   Follow-up: PRN.  Medication Adjustments/Labs and Tests Ordered: Current medicines are reviewed at length with the patient today.  Concerns regarding medicines are outlined above.  Orders Placed This Encounter  Procedures   EKG 12-Lead   ECHOCARDIOGRAM COMPLETE     No orders of the defined types were placed in this encounter.   Patient Instructions  Medication Instructions:  The current medical regimen is effective;  continue present plan and medications.  *If you need a refill on your cardiac medications before your next appointment, please call your pharmacy  Testing/Procedures: Your physician has requested that you have an echocardiogram. Echocardiography is a painless test that uses sound waves to create images of your heart. It provides your doctor with information about the size and shape of your heart and how well your heart's chambers and valves are working. This procedure takes approximately one hour. There are no restrictions for this procedure.  ZIO XT- Long Term Monitor Instructions  Your physician has requested you wear a ZIO patch monitor for 14 days.  This is a single patch monitor. [employer] supplies one patch monitor per enrollment. Additional stickers are not available. Please do not apply patch if you will be having a Nuclear Stress Test,  Echocardiogram, Cardiac CT, MRI, or Chest Xray during the period you would be wearing the  monitor. The patch cannot be worn during these tests. You cannot remove and re-apply the  ZIO XT patch monitor.  Your ZIO patch monitor will be mailed 3 day USPS to your address on file. It may take 3-5 days  to receive your monitor after you have been enrolled.  Once you have received your monitor, please review the enclosed instructions. Your monitor  has already been registered assigning a specific monitor serial # to you.  Billing and Patient Assistance Program Information  We  have supplied [employer] with any of your insurance information on file for billing purposes. [employer] offers a sliding scale Patient Assistance Program for patients that do not have  insurance, or whose insurance does not completely cover the cost of the ZIO monitor.  You must apply for the Patient Assistance Program to qualify for this discounted rate.  To apply, please call [employer] at [phone], select option 4, select option 2,  ask to apply for  Patient Assistance Program. Meredeth Ide will ask your household income, and how many people  are in your household. They will quote your out-of-pocket cost based on that information.  [employer] will also be able to set up a 26-month, interest-free payment plan if needed.  Applying the monitor   Shave hair from upper left chest.  Hold abrader disc by orange tab. Rub abrader in 40 strokes over the upper left chest as  indicated in your monitor instructions.  Clean area with 4 enclosed alcohol pads. Let dry.  Apply patch as indicated in monitor instructions. Patch will be placed under collarbone on left  side of chest with arrow pointing upward.  Rub patch adhesive wings for 2 minutes. Remove white label marked "1". Remove the white  label marked "2". Rub patch adhesive wings for 2 additional minutes.  While looking in a mirror, press and release button in center of patch. A small green light will  flash 3-4 times. This will be your only indicator that the monitor has been turned on.  Do not shower for the first 24 hours. You may shower after the first 24 hours.  Press the button if you feel a symptom. You will hear a small click. Record Date, Time and  Symptom in the Patient Logbook.  When you are ready to remove the patch, follow instructions on the last 2 pages of Patient  Logbook. Stick patch monitor onto the last page of Patient Logbook.  Place Patient Logbook in the blue and white box. Use locking tab on box and tape box closed  securely. The blue  and white box has prepaid postage on it. Please place it in the mailbox as  soon as possible. Your physician should have your test results approximately 7 days after the  monitor has been mailed back to [facility].  Call [facility] Customer Care at [phone] if you have questions regarding  your ZIO XT patch monitor. Call them immediately if you see an orange light blinking on your  monitor.  If your monitor falls off in less than 4 days, contact our Monitor department at [phone].  If your monitor becomes loose or falls off after 4 days call [employer] at [phone] for  suggestions on securing your monitor  Follow-Up: At [facility], you and your health needs are our priority.  As part of our continuing mission to provide you with exceptional heart care, we have created designated Provider Care Teams.  These Care Teams include your primary Cardiologist (physician) and Advanced Practice Providers (APPs -  Physician Assistants and Nurse Practitioners) who all work together to provide you with the care you need, when you need it.  We recommend signing up for the patient portal called "MyChart".  Sign up information is provided on this After Visit Summary.  MyChart is used to connect with patients for Virtual Visits (Telemedicine).  Patients are able to view lab/test results, encounter notes, upcoming appointments, etc.  Non-urgent messages can be sent to your provider as well.   To learn more about what you can do with MyChart, go to [email or web address].    Your next appointment:   Will be determined after the above testing.  Thank you for choosing [facility]!!      I,Mathew Stumpf,acting as a scribe for Donato Schultz, MD.,have documented all relevant documentation on the behalf of Donato Schultz, MD,as directed by  Donato Schultz, MD while in the presence of Donato Schultz, MD.  I, Loraine Leriche  Anne Fu, MD, have reviewed all documentation for this visit. The documentation on  05/29/21 for the exam, diagnosis, procedures, and orders are all accurate and complete.   Signed, Donato Schultz, MD  05/29/2021 3:41 PM    [facility]

## 2021-05-29 ENCOUNTER — Other Ambulatory Visit: Payer: Self-pay

## 2021-05-29 ENCOUNTER — Ambulatory Visit (INDEPENDENT_AMBULATORY_CARE_PROVIDER_SITE_OTHER): Payer: BC Managed Care – PPO

## 2021-05-29 ENCOUNTER — Emergency Department (HOSPITAL_BASED_OUTPATIENT_CLINIC_OR_DEPARTMENT_OTHER)
Admission: EM | Admit: 2021-05-29 | Discharge: 2021-05-29 | Disposition: A | Discharge status: Home / Self Care | Payer: BC Managed Care – PPO | Attending: Emergency Medicine | Admitting: Emergency Medicine

## 2021-05-29 ENCOUNTER — Other Ambulatory Visit: Payer: Self-pay | Admitting: Cardiology

## 2021-05-29 ENCOUNTER — Ambulatory Visit (INDEPENDENT_AMBULATORY_CARE_PROVIDER_SITE_OTHER): Payer: BC Managed Care – PPO | Admitting: Cardiology

## 2021-05-29 ENCOUNTER — Encounter: Payer: Self-pay | Admitting: Cardiology

## 2021-05-29 ENCOUNTER — Encounter (HOSPITAL_BASED_OUTPATIENT_CLINIC_OR_DEPARTMENT_OTHER): Payer: Self-pay

## 2021-05-29 VITALS — BP 100/80 | HR 94 | Ht 63.0 in | Wt 180.0 lb

## 2021-05-29 DIAGNOSIS — R0602 Shortness of breath: Secondary | ICD-10-CM

## 2021-05-29 DIAGNOSIS — Z8616 Personal history of COVID-19: Secondary | ICD-10-CM | POA: Insufficient documentation

## 2021-05-29 DIAGNOSIS — R079 Chest pain, unspecified: Secondary | ICD-10-CM

## 2021-05-29 DIAGNOSIS — J45909 Unspecified asthma, uncomplicated: Secondary | ICD-10-CM | POA: Diagnosis not present

## 2021-05-29 DIAGNOSIS — R059 Cough, unspecified: Secondary | ICD-10-CM | POA: Diagnosis not present

## 2021-05-29 DIAGNOSIS — R002 Palpitations: Secondary | ICD-10-CM

## 2021-05-29 DIAGNOSIS — J101 Influenza due to other identified influenza virus with other respiratory manifestations: Secondary | ICD-10-CM | POA: Insufficient documentation

## 2021-05-29 DIAGNOSIS — Z20822 Contact with and (suspected) exposure to covid-19: Secondary | ICD-10-CM | POA: Diagnosis not present

## 2021-05-29 LAB — RESP PANEL BY RT-PCR (FLU A&B, COVID) ARPGX2
Influenza A by PCR: POSITIVE — AB
Influenza B by PCR: NEGATIVE
SARS Coronavirus 2 by RT PCR: NEGATIVE

## 2021-05-29 MED ORDER — OSELTAMIVIR PHOSPHATE 75 MG PO CAPS: 75.0000 mg | ORAL_CAPSULE | Freq: Two times a day (BID) | ORAL | 0 refills | Status: DC

## 2021-05-29 MED ORDER — ONDANSETRON 4 MG PO TBDP: 4.0000 mg | ORAL_TABLET | Freq: Three times a day (TID) | ORAL | 0 refills | Status: DC | PRN

## 2021-05-29 MED ORDER — ONDANSETRON 4 MG PO TBDP
4.0000 mg | ORAL_TABLET | Freq: Once | ORAL | Status: AC
  Administered 2021-05-29: 4 mg via ORAL
  Filled 2021-05-29: qty 1

## 2021-05-29 NOTE — Assessment & Plan Note (Signed)
Checking Zio patch monitor.  She reports rapid heart rates at times.  Want to make sure that there are no adverse arrhythmias present.

## 2021-05-29 NOTE — ED Provider Notes (Signed)
[facility] EMERGENCY DEPARTMENT Provider Note   CSN: [id number] Arrival date & time: 05/29/21  2019     History Chief Complaint  Patient presents with   Cough    Stefanie Lucero is a 30 y.o. female.  Patient with history of GERD presents today with an fevers.  States that symptoms began yesterday.  Of note, both of her children are have the flu and she states that her symptoms are exactly the same as there is.  She has not tried anything for her symptoms.  Also endorses some chest pain which she says is exacerbated by her cough.  However, she also states that she has been having chest pain for the last month or so, she actually saw cardiology today who did a full work-up that was unremarkable for acute concerns, they recommended giving her a Zio patch and scheduling an echocardiogram in the next few weeks.  Additionally she has significant reflux states has been worse in the past 24 hours.  She is seen by GI for this and takes PPI regularly with some relief.  She also endorses a few episodes of nausea, nonbloody emesis, and generalized body aches.  The history is provided by the patient. No language interpreter was used.  Cough Associated symptoms: fever and rhinorrhea   Associated symptoms: no chills, no headaches, no shortness of breath and no wheezing       Past Medical History:  Diagnosis Date   Anxiety    Anxiety    Asthma    Eosinophilic esophagitis    Gastric ulcer    Gastritis    Headaches, cluster    Migraine    Migraine headache    Pancreatic mass    STD (sexually transmitted disease)    trichomonas treated 2020    Patient Active Problem List   Diagnosis Date Noted   Chest pain of uncertain etiology 05/29/2021   Palpitations 05/29/2021   Shortness of breath 05/29/2021   History of trichomoniasis 12/07/2019   IUD (intrauterine device) in place 12/07/2019   Intractable migraine 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 07/05/2016    Past Surgical  History:  Procedure Laterality Date   CESAREAN SECTION     x 2   INTRAUTERINE DEVICE (IUD) INSERTION     mirena inserted 12-07-2019   TONSILLECTOMY  2001     OB History     Gravida  2   Para  2   Term  2   Preterm      AB      Living  2      SAB      IAB      Ectopic      Multiple      Live Births  2           Family History  Problem Relation Age of Onset   Hypertension Mother    Heart attack Father        times 2   Hypertension Father    Alzheimer's disease Maternal Grandmother    Irregular heart beat Maternal Grandmother    Stroke Maternal Grandfather    Gallbladder disease Maternal Grandfather    Colon cancer Paternal Grandfather 57            Social History   Tobacco Use   Smoking status: Never   Smokeless tobacco: Never  Vaping Use   Vaping Use: Never used  Substance Use Topics   Alcohol use: Yes    Comment: occ  Drug use: No    Home Medications Prior to Admission medications   Medication Sig Start Date End Date Taking? Authorizing Provider  acetaminophen (TYLENOL) 500 MG tablet Take 1,000 mg by mouth every 6 (six) hours as needed for moderate pain or headache.    [provider]  albuterol (VENTOLIN HFA) 108 (90 Base) MCG/ACT inhaler Inhale 2 puffs into the lungs every 4 (four) hours as needed for wheezing or shortness of breath. 11/06/20   [provider]  famotidine (PEPCID) 40 MG tablet Take 1 tablet (40 mg total) by mouth daily. 05/25/21 08/23/21  Theadora Rama Scales, PA-C  fluticasone (FLOVENT HFA) 220 MCG/ACT inhaler Spray medication into mouth then swallow.  Repeat for total of 2 sprays.  Do not inhale medication.  Do not eat or drink for 30 minutes after administering medication.  It is okay to continue swallowing saliva during this 30 minutes.  Perform this treatment twice daily until follow-up with gastroenterology. 05/25/21   Theadora Rama Scales, PA-C  ibuprofen (ADVIL) 200 MG tablet Take 800 mg by mouth  every 6 (six) hours as needed for headache or moderate pain.    [provider]  lansoprazole (PREVACID) 30 MG capsule Take 1 capsule (30 mg total) by mouth 2 (two) times daily before a meal. 05/25/21 08/23/21  Theadora Rama Scales, PA-C  meclizine (ANTIVERT) 25 MG tablet Take 25 mg by mouth 2 (two) times daily as needed for dizziness.    [provider]  ondansetron (ZOFRAN ODT) 4 MG disintegrating tablet Take 1 tablet (4 mg total) by mouth every 8 (eight) hours as needed for nausea or vomiting. 01/26/21   Sponseller, Lupe Carney R, PA-C  sertraline (ZOLOFT) 50 MG tablet Take 50 mg by mouth daily. 04/23/21   [provider]  SUMAtriptan (IMITREX) 50 MG tablet Take 50 mg by mouth daily as needed for migraine. 04/23/21   [provider]  etonogestrel-ethinyl estradiol (NUVARING) 0.12-0.015 MG/24HR vaginal ring Place 1 each vaginally every 28 (twenty-eight) days. Insert vaginally and leave in place for 3 consecutive weeks, then remove for 1 week. Patient not taking: No sig reported 10/02/20 11/12/20  [name], NP    Allergies    Eggs or egg-derived products, Nutmeg oil (myristica oil), Cefdinir, Ceftin [cefuroxime], Guggulipid-black pepper, Iodine, and Nickel  Review of Systems   Review of Systems  Constitutional:  Positive for fatigue and fever. Negative for chills.  HENT:  Positive for congestion, postnasal drip and rhinorrhea.   Respiratory:  Positive for cough. Negative for choking, shortness of breath, wheezing and stridor.   Gastrointestinal:  Positive for nausea and vomiting. Negative for abdominal pain and diarrhea.  Neurological:  Negative for dizziness, tremors, seizures, syncope, facial asymmetry, speech difficulty, weakness, light-headedness, numbness and headaches.  Psychiatric/Behavioral:  Negative for confusion and decreased concentration.   All other systems reviewed and are negative.  Physical Exam Updated Vital Signs BP 117/88 (BP  Location: Left Arm)   Pulse (!) 103   Temp 98.3 F (36.8 C) (Oral)   Resp (!) 22   LMP 05/26/2021 (Approximate)   SpO2 98%   Physical Exam Vitals and nursing note reviewed.  Constitutional:      General: She is not in acute distress.    Appearance: Normal appearance. She is normal weight. She is not ill-appearing, toxic-appearing or diaphoretic.     Comments: Patient well-appearing laying in bed in no acute distress  HENT:     Head: Normocephalic and atraumatic.     Nose:  Nose normal.     Mouth/Throat:     Mouth: Mucous membranes are moist.     Pharynx: No oropharyngeal exudate or posterior oropharyngeal erythema.  Eyes:     Extraocular Movements: Extraocular movements intact.     Pupils: Pupils are equal, round, and reactive to light.  Cardiovascular:     Rate and Rhythm: Normal rate and regular rhythm.     Heart sounds: Normal heart sounds.  Pulmonary:     Effort: Pulmonary effort is normal. No respiratory distress.     Breath sounds: Normal breath sounds. No stridor. No wheezing, rhonchi or rales.  Chest:     Chest wall: No tenderness.  Abdominal:     General: Abdomen is flat.     Palpations: Abdomen is soft.  Musculoskeletal:        General: Normal range of motion.     Cervical back: Normal range of motion.  Skin:    General: Skin is warm and dry.  Neurological:     General: No focal deficit present.     Mental Status: She is alert.  Psychiatric:        Mood and Affect: Mood normal.        Behavior: Behavior normal.    ED Results / Procedures / Treatments   Labs (all labs ordered are listed, but only abnormal results are displayed) Labs Reviewed  RESP PANEL BY RT-PCR (FLU A&B, COVID) ARPGX2 - Abnormal; Notable for the following components:      Result Value   Influenza A by PCR POSITIVE (*)    All other components within normal limits    EKG None  Radiology No results found.  Procedures Procedures   Medications Ordered in ED Medications - No data  to display  ED Course  I have reviewed the triage vital signs and the nursing notes.  Pertinent labs & imaging results that were available during my care of the patient were reviewed by me and considered in my medical decision making (see chart for details).    MDM Rules/Calculators/A&P                         Patient with 1 day of URI symptoms. Positive for Influenza A.  Patient alert, speaking in complete sentences, in no acute distress. Discussed with patient her chest pain and reflux, she states that these are both longstanding problems which she is followed by GI and cardiology for.  Some worsening of reflux, however patient is already on PPI, has attempted Carafate stated that she did not like the way it feels.  Also mention TUMS, and other supportive measures for reducing reflux such as sitting up for several hours after eating, reducing inflammatory foods such as tomatoes, chocolate, caffeine.  Patient would like to defer further work-up for outpatient management with her specialists.  States that she only came to make sure that she did actually have flu.  Patient's lungs clear to auscultation in all fields, therefore no imaging is indicated.  She is also oxygenating at 100% on room air, afebrile, nontoxic-appearing, and in no acute distress. Discussed that antibiotics are not indicated for viral infections. Pt will be discharged with symptomatic treatment.  Verbalizes understanding and is agreeable with plan. Pt is hemodynamically stable & in NAD prior to dc.   Final Clinical Impression(s) / ED Diagnoses Final diagnoses:  Influenza A    Rx / DC Orders ED Discharge Orders  Ordered    oseltamivir (TAMIFLU) 75 MG capsule  Every 12 hours        05/29/21 2322    ondansetron (ZOFRAN ODT) 4 MG disintegrating tablet  Every 8 hours PRN        05/29/21 2322          An After Visit Summary was printed and given to the patient.    Vear Clock 05/29/21 2322     Vanetta Mulders, MD 06/12/21 [phone]

## 2021-05-29 NOTE — Discharge Instructions (Addendum)
You are diagnosed with influenza A in the emergency department this evening.  I have prescribed you Tamiflu and Zofran for your symptoms.  Please take these as prescribed.  You may also take Tylenol for fever management.  The most important thing with the flu is supportive care including plenty of fluids and rest.  It is normal to feel unwell for the next few days as the virus runs its course.  Please follow-up with your primary care provider in the next few days for continued symptom management  You additionally mentioned that you were having some increased difficulty with reflux.  It sounds like you have pretty thoroughly managed with your GI doctor for this.  Ensure that you are sitting upright for the next few hours after eating and avoid offending inflammatory foods such as tomatoes, caffeine, chocolate.  Continue to take your PPI as you have been's prescribed.  You may also take Tums with meals.  Return if development of new or worsening symptoms.

## 2021-05-29 NOTE — Patient Instructions (Signed)
Medication Instructions:  The current medical regimen is effective;  continue present plan and medications.  *If you need a refill on your cardiac medications before your next appointment, please call your pharmacy  Testing/Procedures: Your physician has requested that you have an echocardiogram. Echocardiography is a painless test that uses sound waves to create images of your heart. It provides your doctor with information about the size and shape of your heart and how well your heart's chambers and valves are working. This procedure takes approximately one hour. There are no restrictions for this procedure.  ZIO XT- Long Term Monitor Instructions  Your physician has requested you wear a ZIO patch monitor for 14 days.  This is a single patch monitor. [employer] supplies one patch monitor per enrollment. Additional stickers are not available. Please do not apply patch if you will be having a Nuclear Stress Test,  Echocardiogram, Cardiac CT, MRI, or Chest Xray during the period you would be wearing the  monitor. The patch cannot be worn during these tests. You cannot remove and re-apply the  ZIO XT patch monitor.  Your ZIO patch monitor will be mailed 3 day USPS to your address on file. It may take 3-5 days  to receive your monitor after you have been enrolled.  Once you have received your monitor, please review the enclosed instructions. Your monitor  has already been registered assigning a specific monitor serial # to you.  Billing and Patient Assistance Program Information  We have supplied [employer] with any of your insurance information on file for billing purposes. [employer] offers a sliding scale Patient Assistance Program for patients that do not have  insurance, or whose insurance does not completely cover the cost of the ZIO monitor.  You must apply for the Patient Assistance Program to qualify for this discounted rate.  To apply, please call [employer] at [phone], select option 4,  select option 2, ask to apply for  Patient Assistance Program. Meredeth Ide will ask your household income, and how many people  are in your household. They will quote your out-of-pocket cost based on that information.  [employer] will also be able to set up a 35-month, interest-free payment plan if needed.  Applying the monitor   Shave hair from upper left chest.  Hold abrader disc by orange tab. Rub abrader in 40 strokes over the upper left chest as  indicated in your monitor instructions.  Clean area with 4 enclosed alcohol pads. Let dry.  Apply patch as indicated in monitor instructions. Patch will be placed under collarbone on left  side of chest with arrow pointing upward.  Rub patch adhesive wings for 2 minutes. Remove white label marked "1". Remove the white  label marked "2". Rub patch adhesive wings for 2 additional minutes.  While looking in a mirror, press and release button in center of patch. A small green light will  flash 3-4 times. This will be your only indicator that the monitor has been turned on.  Do not shower for the first 24 hours. You may shower after the first 24 hours.  Press the button if you feel a symptom. You will hear a small click. Record Date, Time and  Symptom in the Patient Logbook.  When you are ready to remove the patch, follow instructions on the last 2 pages of Patient  Logbook. Stick patch monitor onto the last page of Patient Logbook.  Place Patient Logbook in the blue and white box. Use locking tab on box and tape box  closed  securely. The blue and white box has prepaid postage on it. Please place it in the mailbox as  soon as possible. Your physician should have your test results approximately 7 days after the  monitor has been mailed back to [facility].  Call [facility] Customer Care at [phone] if you have questions regarding  your ZIO XT patch monitor. Call them immediately if you see an orange light blinking on your  monitor.  If your  monitor falls off in less than 4 days, contact our Monitor department at [phone].  If your monitor becomes loose or falls off after 4 days call [employer] at [phone] for  suggestions on securing your monitor  Follow-Up: At [facility], you and your health needs are our priority.  As part of our continuing mission to provide you with exceptional heart care, we have created designated Provider Care Teams.  These Care Teams include your primary Cardiologist (physician) and Advanced Practice Providers (APPs -  Physician Assistants and Nurse Practitioners) who all work together to provide you with the care you need, when you need it.  We recommend signing up for the patient portal called "MyChart".  Sign up information is provided on this After Visit Summary.  MyChart is used to connect with patients for Virtual Visits (Telemedicine).  Patients are able to view lab/test results, encounter notes, upcoming appointments, etc.  Non-urgent messages can be sent to your provider as well.   To learn more about what you can do with MyChart, go to [email or web address].    Your next appointment:   Will be determined after the above testing.  Thank you for choosing [facility]!!

## 2021-05-29 NOTE — ED Triage Notes (Addendum)
Pt c/o flu like sx started yesterday with +flu exposure-chest pain when she breathes in and out and worse when she lies down-NAD-steady gait

## 2021-05-29 NOTE — Assessment & Plan Note (Signed)
Minimal sensation with activity.  Checking echocardiogram.

## 2021-05-29 NOTE — Assessment & Plan Note (Addendum)
Checking echocardiogram to ensure that she has normal structure and function of her heart and no pericardial effusion or inflammation.  By description, sharp central chest discomfort with prior diagnosis of gastric ulcers, eosinophilic esophagitis point towards GI condition as leading culprit for chest pain.  She is continuing with proton pump inhibitor as well as Pepcid at night.  Dietary modifications.  Overall, ER visit reviewed and there were negative troponin, normal EKG.  Reassurance.

## 2021-05-29 NOTE — Progress Notes (Unsigned)
Enrolled patient for a 14 day Zio XT  monitor to be mailed to patients home  °

## 2021-06-01 DIAGNOSIS — R002 Palpitations: Secondary | ICD-10-CM | POA: Diagnosis not present

## 2021-06-01 DIAGNOSIS — R079 Chest pain, unspecified: Secondary | ICD-10-CM | POA: Diagnosis not present

## 2021-06-01 DIAGNOSIS — R0602 Shortness of breath: Secondary | ICD-10-CM

## 2021-06-09 ENCOUNTER — Encounter: Payer: Self-pay | Admitting: Nurse Practitioner

## 2021-06-09 NOTE — Telephone Encounter (Signed)
Was not able to reach pt regarding appointment. I had to cancel appointment. Left message for pt to return our call

## 2021-06-09 NOTE — Telephone Encounter (Signed)
Appt 06/10/2021 is still available. Patient able to keep appt

## 2021-06-09 NOTE — Telephone Encounter (Signed)
Appt 06/10/2021. Patient appt is scheduled patient aware of visit

## 2021-06-10 ENCOUNTER — Ambulatory Visit
Admission: RE | Admit: 2021-06-10 | Discharge: 2021-06-10 | Disposition: A | Discharge status: Home / Self Care | Payer: BC Managed Care – PPO | Source: Ambulatory Visit | Attending: Nurse Practitioner | Admitting: Nurse Practitioner

## 2021-06-10 ENCOUNTER — Emergency Department (HOSPITAL_COMMUNITY): Payer: BC Managed Care – PPO

## 2021-06-10 ENCOUNTER — Other Ambulatory Visit: Payer: Self-pay

## 2021-06-10 ENCOUNTER — Other Ambulatory Visit: Payer: BC Managed Care – PPO

## 2021-06-10 ENCOUNTER — Encounter (HOSPITAL_COMMUNITY): Payer: Self-pay

## 2021-06-10 ENCOUNTER — Emergency Department (HOSPITAL_COMMUNITY)
Admission: EM | Admit: 2021-06-10 | Discharge: 2021-06-10 | Disposition: A | Discharge status: Home / Self Care | Payer: BC Managed Care – PPO | Attending: Emergency Medicine | Admitting: Emergency Medicine

## 2021-06-10 DIAGNOSIS — N9489 Other specified conditions associated with female genital organs and menstrual cycle: Secondary | ICD-10-CM | POA: Diagnosis not present

## 2021-06-10 DIAGNOSIS — R079 Chest pain, unspecified: Secondary | ICD-10-CM | POA: Diagnosis not present

## 2021-06-10 DIAGNOSIS — N644 Mastodynia: Secondary | ICD-10-CM | POA: Diagnosis not present

## 2021-06-10 DIAGNOSIS — R0789 Other chest pain: Secondary | ICD-10-CM | POA: Diagnosis not present

## 2021-06-10 DIAGNOSIS — K219 Gastro-esophageal reflux disease without esophagitis: Secondary | ICD-10-CM | POA: Diagnosis not present

## 2021-06-10 DIAGNOSIS — R110 Nausea: Secondary | ICD-10-CM | POA: Diagnosis not present

## 2021-06-10 DIAGNOSIS — J45909 Unspecified asthma, uncomplicated: Secondary | ICD-10-CM | POA: Insufficient documentation

## 2021-06-10 LAB — CBC WITH DIFFERENTIAL/PLATELET
Abs Immature Granulocytes: 0.02 10*3/uL (ref 0.00–0.07)
Basophils Absolute: 0 10*3/uL (ref 0.0–0.1)
Basophils Relative: 0 %
Eosinophils Absolute: 0.1 10*3/uL (ref 0.0–0.5)
Eosinophils Relative: 2 %
HCT: 40 % (ref 36.0–46.0)
Hemoglobin: 12.8 g/dL (ref 12.0–15.0)
Immature Granulocytes: 0 %
Lymphocytes Relative: 32 %
Lymphs Abs: 2.2 10*3/uL (ref 0.7–4.0)
MCH: 28.4 pg (ref 26.0–34.0)
MCHC: 32 g/dL (ref 30.0–36.0)
MCV: 88.7 fL (ref 80.0–100.0)
Monocytes Absolute: 0.6 10*3/uL (ref 0.1–1.0)
Monocytes Relative: 9 %
Neutro Abs: 4 10*3/uL (ref 1.7–7.7)
Neutrophils Relative %: 57 %
Platelets: 265 10*3/uL (ref 150–400)
RBC: 4.51 MIL/uL (ref 3.87–5.11)
RDW: 13.5 % (ref 11.5–15.5)
WBC: 6.9 10*3/uL (ref 4.0–10.5)
nRBC: 0 % (ref 0.0–0.2)

## 2021-06-10 LAB — LIPASE, BLOOD: Lipase: 31 U/L (ref 11–51)

## 2021-06-10 LAB — COMPREHENSIVE METABOLIC PANEL
ALT: 21 U/L (ref 0–44)
AST: 20 U/L (ref 15–41)
Albumin: 4.1 g/dL (ref 3.5–5.0)
Alkaline Phosphatase: 58 U/L (ref 38–126)
Anion gap: 8 (ref 5–15)
BUN: 10 mg/dL (ref 6–20)
CO2: 25 mmol/L (ref 22–32)
Calcium: 9 mg/dL (ref 8.9–10.3)
Chloride: 106 mmol/L (ref 98–111)
Creatinine, Ser: 0.58 mg/dL (ref 0.44–1.00)
GFR, Estimated: >60 mL/min (ref >60)
Glucose, Bld: 115 mg/dL — ABNORMAL HIGH (ref 70–99)
Potassium: 3.6 mmol/L (ref 3.5–5.1)
Sodium: 139 mmol/L (ref 135–145)
Total Bilirubin: 0.4 mg/dL (ref 0.3–1.2)
Total Protein: 6.8 g/dL (ref 6.5–8.1)

## 2021-06-10 LAB — TROPONIN I (HIGH SENSITIVITY)
Troponin I (High Sensitivity): <2 ng/L (ref <18)
Troponin I (High Sensitivity): <2 ng/L (ref <18)

## 2021-06-10 LAB — I-STAT BETA HCG BLOOD, ED (MC, WL, AP ONLY): I-stat hCG, quantitative: <5 m[IU]/mL (ref <5)

## 2021-06-10 LAB — D-DIMER, QUANTITATIVE: D-Dimer, Quant: <0.27 ug/mL-FEU (ref 0.00–0.50)

## 2021-06-10 MED ORDER — ONDANSETRON 4 MG PO TBDP: 4.0000 mg | ORAL_TABLET | Freq: Three times a day (TID) | ORAL | 0 refills | Status: DC | PRN

## 2021-06-10 MED ORDER — LIDOCAINE VISCOUS HCL 2 % MT SOLN
15.0000 mL | Freq: Once | OROMUCOSAL | Status: AC
  Administered 2021-06-10: 15 mL via ORAL
  Filled 2021-06-10: qty 15

## 2021-06-10 MED ORDER — ONDANSETRON 4 MG PO TBDP
4.0000 mg | ORAL_TABLET | Freq: Once | ORAL | Status: AC
  Administered 2021-06-10: 4 mg via ORAL
  Filled 2021-06-10: qty 1

## 2021-06-10 MED ORDER — ALUM & MAG HYDROXIDE-SIMETH 200-200-20 MG/5ML PO SUSP
30.0000 mL | Freq: Once | ORAL | Status: AC
  Administered 2021-06-10: 30 mL via ORAL
  Filled 2021-06-10: qty 30

## 2021-06-10 NOTE — Discharge Instructions (Signed)
Take the nausea medication as prescribed.  Continue your proton pump inhibitor.  Follow-up with your gastroenterologist as scheduled.  Avoid alcohol, caffeine, NSAID medications, spicy foods.  Return to the ED with exertional chest pain, chest pain associate with shortness of breath, vomiting, sweating, any other concerns.

## 2021-06-10 NOTE — ED Triage Notes (Signed)
Pt reports with chest pain and left arm pain since Sunday. Pt states that she can't tell if it's her acid reflux or not.

## 2021-06-10 NOTE — ED Provider Notes (Signed)
[facility] DEPT Provider Note   CSN: [id number] Arrival date & time: 06/10/21  0316     History Chief Complaint  Patient presents with   Chest Pain   Arm Pain    PEARLA HOLTSCLAW is a 30 y.o. female.  Patient with a history of eosinophilic esophagitis, gastritis, migraines here with 3 days of constant left-sided chest pain that radiates under her left breast and arm.  This is similar to previous episodes of acid reflux.  States she came in tonight because she has severe nausea and is able to sleep but denies any vomiting.  Pain is in the center of her chest and radiates to her left armpit similar to previous episodes of GERD.  She was recently switched from omeprazole to lansoprazole due to ineffectiveness.  She is also taking Pepcid.  She denies any NSAID use, caffeine use, alcohol use or spicy foods. Chest discomfort associated with nausea and some shortness of breath.  No cough or fever.  No abdominal pain. She was recently seen by urgent care as well as her OB doctor and referred for breast ultrasound which she is going to have later today due to "dense breast tissue" and concerned that this could be contributing to her left-sided chest discomfort.  She has been seen by cardiology and wore a Holter monitor but does not know the results.  She is being scheduled for an echocardiogram. Her chest pain is not exertional or pleuritic. No vomiting.  No black or bloody stools  The history is provided by the patient.  Chest Pain Associated symptoms: nausea   Associated symptoms: no abdominal pain, no cough, no dizziness, no fever, no headache, no shortness of breath, no vomiting and no weakness   Arm Pain Associated symptoms include chest pain. Pertinent negatives include no abdominal pain, no headaches and no shortness of breath.      Past Medical History:  Diagnosis Date   Anxiety    Anxiety    Asthma    Eosinophilic esophagitis    Gastric ulcer     Gastritis    Headaches, cluster    Migraine    Migraine headache    Pancreatic mass    STD (sexually transmitted disease)    trichomonas treated 2020    Patient Active Problem List   Diagnosis Date Noted   Chest pain of uncertain etiology [id number]   Palpitations 05/29/2021   Shortness of breath 05/29/2021   History of trichomoniasis 12/07/2019   IUD (intrauterine device) in place 12/07/2019   Intractable migraine 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 07/05/2016    Past Surgical History:  Procedure Laterality Date   CESAREAN SECTION     x 2   INTRAUTERINE DEVICE (IUD) INSERTION     mirena inserted 12-07-2019   TONSILLECTOMY  2001     OB History     Gravida  2   Para  2   Term  2   Preterm      AB      Living  2      SAB      IAB      Ectopic      Multiple      Live Births  2           Family History  Problem Relation Age of Onset   Hypertension Mother    Heart attack Father        times 2   Hypertension Father  Alzheimer's disease Maternal Grandmother    Irregular heart beat Maternal Grandmother    Stroke Maternal Grandfather    Gallbladder disease Maternal Grandfather    Colon cancer Paternal Grandfather 68            Social History   Tobacco Use   Smoking status: Never   Smokeless tobacco: Never  Vaping Use   Vaping Use: Never used  Substance Use Topics   Alcohol use: Yes    Comment: occ   Drug use: No    Home Medications Prior to Admission medications   Medication Sig Start Date End Date Taking? Authorizing Provider  acetaminophen (TYLENOL) 500 MG tablet Take 1,000 mg by mouth every 6 (six) hours as needed for moderate pain or headache.    [provider]  albuterol (VENTOLIN HFA) 108 (90 Base) MCG/ACT inhaler Inhale 2 puffs into the lungs every 4 (four) hours as needed for wheezing or shortness of breath. 11/06/20   [provider]  famotidine (PEPCID) 40 MG tablet Take 1 tablet (40 mg total) by mouth  daily. 05/25/21 08/23/21  Lynden Oxford Scales, PA-C  fluticasone (FLOVENT HFA) 220 MCG/ACT inhaler Spray medication into mouth then swallow.  Repeat for total of 2 sprays.  Do not inhale medication.  Do not eat or drink for 30 minutes after administering medication.  It is okay to continue swallowing saliva during this 30 minutes.  Perform this treatment twice daily until follow-up with gastroenterology. 05/25/21   Lynden Oxford Scales, PA-C  ibuprofen (ADVIL) 200 MG tablet Take 800 mg by mouth every 6 (six) hours as needed for headache or moderate pain.    [provider]  lansoprazole (PREVACID) 30 MG capsule Take 1 capsule (30 mg total) by mouth 2 (two) times daily before a meal. 05/25/21 08/23/21  Lynden Oxford Scales, PA-C  meclizine (ANTIVERT) 25 MG tablet Take 25 mg by mouth 2 (two) times daily as needed for dizziness.    [provider]  ondansetron (ZOFRAN ODT) 4 MG disintegrating tablet Take 1 tablet (4 mg total) by mouth every 8 (eight) hours as needed for nausea or vomiting. 05/29/21   Smoot, Leary Roca, PA-C  oseltamivir (TAMIFLU) 75 MG capsule Take 1 capsule (75 mg total) by mouth every 12 (twelve) hours. 05/29/21   Smoot, Leary Roca, PA-C  sertraline (ZOLOFT) 50 MG tablet Take 50 mg by mouth daily. 04/23/21   [provider]  SUMAtriptan (IMITREX) 50 MG tablet Take 50 mg by mouth daily as needed for migraine. 04/23/21   [provider]  etonogestrel-ethinyl estradiol (NUVARING) 0.12-0.015 MG/24HR vaginal ring Place 1 each vaginally every 28 (twenty-eight) days. Insert vaginally and leave in place for 3 consecutive weeks, then remove for 1 week. Patient not taking: No sig reported 10/02/20 11/12/20  Marny Lowenstein A, NP    Allergies    Eggs or egg-derived products, Nutmeg oil (myristica oil), Cefdinir, Ceftin [cefuroxime], Guggulipid-black pepper, Iodine, and Nickel  Review of Systems   Review of Systems  Constitutional:  Negative for activity change,  appetite change and fever.  HENT:  Negative for congestion and rhinorrhea.   Eyes:  Negative for visual disturbance.  Respiratory:  Positive for chest tightness. Negative for cough and shortness of breath.   Cardiovascular:  Positive for chest pain.  Gastrointestinal:  Positive for nausea. Negative for abdominal pain and vomiting.  Genitourinary:  Negative for dysuria and hematuria.  Musculoskeletal:  Negative for arthralgias and myalgias.  Skin:  Negative for rash.  Neurological:  Negative for dizziness, weakness and headaches.   all other systems are negative except as noted in the HPI and PMH.   Physical Exam Updated Vital Signs BP 126/84 (BP Location: Right Arm)   Pulse 96   Temp 97.9 F (36.6 C) (Oral)   Resp 13   Ht 5\' 3"  (1.6 m)   Wt 81.6 kg   LMP 05/26/2021 (Approximate)   SpO2 96%   BMI 31.89 kg/m   Physical Exam Vitals and nursing note reviewed.  Constitutional:      General: She is not in acute distress.    Appearance: Normal appearance. She is well-developed and normal weight. She is not ill-appearing.  HENT:     Head: Normocephalic and atraumatic.     Mouth/Throat:     Pharynx: No oropharyngeal exudate.  Eyes:     Conjunctiva/sclera: Conjunctivae normal.     Pupils: Pupils are equal, round, and reactive to light.  Neck:     Comments: No meningismus. Cardiovascular:     Rate and Rhythm: Normal rate and regular rhythm.     Heart sounds: Normal heart sounds. No murmur heard. Pulmonary:     Effort: Pulmonary effort is normal. No respiratory distress.     Breath sounds: Normal breath sounds.     Comments: Chaperone Micah NT present. TTP L sided chest Patient declines formal breast exam Chest:     Chest wall: No tenderness.  Abdominal:     Palpations: Abdomen is soft.     Tenderness: There is no abdominal tenderness. There is no guarding or rebound.  Musculoskeletal:        General: No tenderness. Normal range of motion.     Cervical back: Normal range  of motion and neck supple.  Skin:    General: Skin is warm.  Neurological:     Mental Status: She is alert and oriented to person, place, and time.     Cranial Nerves: No cranial nerve deficit.     Motor: No abnormal muscle tone.     Coordination: Coordination normal.     Comments:  5/5 strength throughout. CN 2-12 intact.Equal grip strength.   Psychiatric:        Behavior: Behavior normal.    ED Results / Procedures / Treatments   Labs (all labs ordered are listed, but only abnormal results are displayed) Labs Reviewed  COMPREHENSIVE METABOLIC PANEL - Abnormal; Notable for the following components:      Result Value   Glucose, Bld 115 (*)    All other components within normal limits  CBC WITH DIFFERENTIAL/PLATELET  D-DIMER, QUANTITATIVE  LIPASE, BLOOD  I-STAT BETA HCG BLOOD, ED ([facility], [facility], [facility] ONLY)  TROPONIN I (HIGH SENSITIVITY)  TROPONIN I (HIGH SENSITIVITY)    EKG EKG Interpretation  Date/Time:  Wednesday June 10 2021 03:58:38 EST Ventricular Rate:  90 PR Interval:  160 QRS Duration: 89 QT Interval:  451 QTC Calculation: 552 R Axis:   55 Text Interpretation: Sinus rhythm Abnormal Q suggests anterior infarct Nonspecific T abnormalities, diffuse leads Prolonged QT interval Nonspecific T wave abnormality Confirmed by Ezequiel Essex [phone]) on 06/10/2021 4:37:36 AM  Radiology DG Chest Portable 1 View  Result Date: 06/10/2021 CLINICAL DATA:  30 year old female with sudden onset mid chest pain. EXAM: PORTABLE CHEST 1 VIEW COMPARISON:  Chest radiographs 05/07/2021 and earlier. FINDINGS: Portable AP semi upright view at 0550 hours. Mildly lower lung volumes. Normal cardiac size and mediastinal contours. Visualized tracheal air column is within normal limits. Allowing for portable technique  the lungs are clear. No pneumothorax or pleural effusion. Paucity of bowel gas in the upper abdomen. No osseous abnormality identified. IMPRESSION: Negative portable chest. Electronically  Signed   By: Odessa Fleming M.D.   On: 06/10/2021 06:02    Procedures Procedures   Medications Ordered in ED Medications  alum & mag hydroxide-simeth (MAALOX/MYLANTA) 200-200-20 MG/5ML suspension 30 mL (has no administration in time range)    And  lidocaine (XYLOCAINE) 2 % viscous mouth solution 15 mL (has no administration in time range)  ondansetron (ZOFRAN-ODT) disintegrating tablet 4 mg (has no administration in time range)    ED Course  I have reviewed the triage vital signs and the nursing notes.  Pertinent labs & imaging results that were available during my care of the patient were reviewed by me and considered in my medical decision making (see chart for details).    MDM Rules/Calculators/A&P                          L sided chest discomfort x 3 days similar to previous episodes of GERD.   EKG with nonspecific T wave changes similar to previous.  Low suspicion for ACS or PE.   Chest x-ray negative.  Troponin negative.  LFTs and lipase are normal.  Pain has resolved after GI cocktail and Zofran.  No vomiting.  Patient reports she had an EGD in February by [facility] gastroenterology.  Records are not available.  She is going to see them again on December 1.  She is anxious for discharge stating she needs to go to work.  States nausea has resolved.  Will give short course of antinausea medication. Low suspicion for ACS or pulmonary embolism.  Discussed continuing her PPI.  Avoid alcohol, caffeine, NSAID medications, spicy foods.  Return to the ED with exertional chest pain, shortness of breath, vomiting, diaphoresis or any other concerns. Final Clinical Impression(s) / ED Diagnoses Final diagnoses:  Atypical chest pain  Gastroesophageal reflux disease, unspecified whether esophagitis present    Rx / DC Orders ED Discharge Orders     None        Darcie Mellone, Jeannett Senior, MD 06/10/21 [phone]

## 2021-06-15 ENCOUNTER — Emergency Department (HOSPITAL_BASED_OUTPATIENT_CLINIC_OR_DEPARTMENT_OTHER): Payer: BC Managed Care – PPO

## 2021-06-15 ENCOUNTER — Encounter (HOSPITAL_BASED_OUTPATIENT_CLINIC_OR_DEPARTMENT_OTHER): Payer: Self-pay | Admitting: *Deleted

## 2021-06-15 ENCOUNTER — Other Ambulatory Visit: Payer: Self-pay

## 2021-06-15 ENCOUNTER — Emergency Department (HOSPITAL_BASED_OUTPATIENT_CLINIC_OR_DEPARTMENT_OTHER)
Admission: EM | Admit: 2021-06-15 | Discharge: 2021-06-16 | Disposition: A | Discharge status: Home / Self Care | Payer: BC Managed Care – PPO | Attending: Emergency Medicine | Admitting: Emergency Medicine

## 2021-06-15 DIAGNOSIS — R0789 Other chest pain: Secondary | ICD-10-CM | POA: Insufficient documentation

## 2021-06-15 DIAGNOSIS — J45909 Unspecified asthma, uncomplicated: Secondary | ICD-10-CM | POA: Insufficient documentation

## 2021-06-15 DIAGNOSIS — R079 Chest pain, unspecified: Secondary | ICD-10-CM | POA: Diagnosis not present

## 2021-06-15 LAB — CBC WITH DIFFERENTIAL/PLATELET
Abs Immature Granulocytes: 0.02 10*3/uL (ref 0.00–0.07)
Basophils Absolute: 0 10*3/uL (ref 0.0–0.1)
Basophils Relative: 0 %
Eosinophils Absolute: 0.1 10*3/uL (ref 0.0–0.5)
Eosinophils Relative: 1 %
HCT: 40.8 % (ref 36.0–46.0)
Hemoglobin: 13.2 g/dL (ref 12.0–15.0)
Immature Granulocytes: 0 %
Lymphocytes Relative: 28 %
Lymphs Abs: 2.1 10*3/uL (ref 0.7–4.0)
MCH: 28.5 pg (ref 26.0–34.0)
MCHC: 32.4 g/dL (ref 30.0–36.0)
MCV: 88.1 fL (ref 80.0–100.0)
Monocytes Absolute: 0.6 10*3/uL (ref 0.1–1.0)
Monocytes Relative: 8 %
Neutro Abs: 4.7 10*3/uL (ref 1.7–7.7)
Neutrophils Relative %: 63 %
Platelets: 273 10*3/uL (ref 150–400)
RBC: 4.63 MIL/uL (ref 3.87–5.11)
RDW: 13.1 % (ref 11.5–15.5)
WBC: 7.5 10*3/uL (ref 4.0–10.5)
nRBC: 0 % (ref 0.0–0.2)

## 2021-06-15 LAB — PREGNANCY, URINE: Preg Test, Ur: NEGATIVE

## 2021-06-15 LAB — D-DIMER, QUANTITATIVE: D-Dimer, Quant: <0.27 ug/mL-FEU (ref 0.00–0.50)

## 2021-06-15 NOTE — ED Triage Notes (Signed)
Pt c/o chest pain with movt from " left to right and bending over " x 1 week , seen last week for same at [facility] states no improvement , Dx gerd taking meds appt GI in dec

## 2021-06-15 NOTE — ED Provider Notes (Signed)
Emergency Department Provider Note   I have reviewed the triage vital signs and the nursing notes.   HISTORY  Chief Complaint Chest Pain   HPI Stefanie Lucero is a 30 y.o. female with past history reviewed below returns to the emergency department with sharp chest pain worse with movement.  She self-reports a history of esophagitis and was seen in the ED on 11/16. She reports no clear diagnosis at that time and was discharged with plan for Zofran and GI follow up. Pain is sharp and in the center of the chest. She has radiation of pain to the armpits with intense pain episodes. Pain seems worse with movement in any direction. Denies fever, chills, SOB. No flu-like symptoms.    Past Medical History:  Diagnosis Date   Anxiety    Anxiety    Asthma    Eosinophilic esophagitis    Gastric ulcer    Gastritis    Headaches, cluster    Migraine    Migraine headache    Pancreatic mass    STD (sexually transmitted disease)    trichomonas treated 2020    Patient Active Problem List   Diagnosis Date Noted   Chest pain of uncertain etiology 05/29/2021   Palpitations 05/29/2021   Shortness of breath 05/29/2021   History of trichomoniasis 12/07/2019   IUD (intrauterine device) in place 12/07/2019   Intractable migraine 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 07/05/2016    Past Surgical History:  Procedure Laterality Date   CESAREAN SECTION     x 2   INTRAUTERINE DEVICE (IUD) INSERTION     mirena inserted 12-07-2019   TONSILLECTOMY  2001    Allergies Eggs or egg-derived products, Nutmeg oil (myristica oil), Cefdinir, Ceftin [cefuroxime], Guggulipid-black pepper, Iodine, and Nickel  Family History  Problem Relation Age of Onset   Hypertension Mother    Heart attack Father        times 2   Hypertension Father    Alzheimer's disease Maternal Grandmother    Irregular heart beat Maternal Grandmother    Stroke Maternal Grandfather    Gallbladder disease Maternal Grandfather     Colon cancer Paternal Grandfather 44            Social History Social History   Tobacco Use   Smoking status: Never   Smokeless tobacco: Never  Vaping Use   Vaping Use: Never used  Substance Use Topics   Alcohol use: Yes    Comment: occ   Drug use: No    Review of Systems  Constitutional: No fever/chills Eyes: No visual changes. ENT: No sore throat. Cardiovascular: Positive sharp chest pain. Respiratory: Denies shortness of breath. Gastrointestinal: No abdominal pain.  No nausea, no vomiting.  No diarrhea.  No constipation. Genitourinary: Negative for dysuria. Musculoskeletal: Negative for back pain. Skin: Negative for rash. Neurological: Negative for headaches, focal weakness or numbness.  10-point ROS otherwise negative.  ____________________________________________   PHYSICAL EXAM:  VITAL SIGNS: ED Triage Vitals  Enc Vitals Group     BP 06/15/21 2242 (!) 138/99     Pulse Rate 06/15/21 2242 (!) 105     Resp 06/15/21 2242 16     Temp 06/15/21 2242 97.6 F (36.4 C)     Temp Source 06/15/21 2242 Oral     SpO2 06/15/21 2242 99 %     Weight 06/15/21 2241 180 lb (81.6 kg)     Height 06/15/21 2241 5\' 3"  (1.6 m)   Constitutional: Alert and oriented. Well  appearing and in no acute distress. Eyes: Conjunctivae are normal.  Head: Atraumatic. Nose: No congestion/rhinnorhea. Mouth/Throat: Mucous membranes are moist.   Neck: No stridor.  Cardiovascular: Normal rate, regular rhythm. Good peripheral circulation. Grossly normal heart sounds.   Respiratory: Normal respiratory effort.  No retractions. Lungs CTAB. Gastrointestinal: Soft and nontender. No distention.  Musculoskeletal: No lower extremity tenderness nor edema. No gross deformities of extremities. Tenderness to palpation of the anterior chest wall.  Neurologic:  Normal speech and language. No gross focal neurologic deficits are appreciated.  Skin:  Skin is warm, dry and intact. No rash  noted.  ____________________________________________   LABS (all labs ordered are listed, but only abnormal results are displayed)  Labs Reviewed  COMPREHENSIVE METABOLIC PANEL - Abnormal; Notable for the following components:      Result Value   Potassium 3.4 (*)    Glucose, Bld 124 (*)    All other components within normal limits  CBC WITH DIFFERENTIAL/PLATELET  D-DIMER, QUANTITATIVE  PREGNANCY, URINE  TROPONIN I (HIGH SENSITIVITY)  TROPONIN I (HIGH SENSITIVITY)   ____________________________________________  EKG   EKG Interpretation  Date/Time:  Monday June 15 2021 22:52:30 EST Ventricular Rate:  88 PR Interval:  154 QRS Duration: 76 QT Interval:  365 QTC Calculation: 442 R Axis:   50 Text Interpretation: Normal sinus rhythm Similar to prior Confirmed by Alona Bene [phone]) on 06/15/2021 10:59:05 PM        ____________________________________________  RADIOLOGY  DG Chest Portable 1 View  Result Date: 06/15/2021 CLINICAL DATA:  Chest pain EXAM: PORTABLE CHEST 1 VIEW COMPARISON:  06/10/2021 FINDINGS: The heart size and mediastinal contours are within normal limits. Both lungs are clear. The visualized skeletal structures are unremarkable. IMPRESSION: No active disease. Electronically Signed   By: Jasmine Pang M.D.   On: 06/15/2021 23:30    ____________________________________________   PROCEDURES  Procedure(s) performed:   Procedures  None  ____________________________________________   INITIAL IMPRESSION / ASSESSMENT AND PLAN / ED COURSE  Pertinent labs & imaging results that were available during my care of the patient were reviewed by me and considered in my medical decision making (see chart for details).   Patient presents to the emergency department with atypical chest pain.  She is been seen for this in the past.  Most recent ED visit included labs for ACS and PE which were negative.  Patient describes to me as sharp central pain worse  with movement.  Suspect MSK etiology but patient does arrive tachycardic.  Will obtain D-dimer although patient is overall low risk by [place].  Troponin pending.  EKG unchanged from prior.  Chest x-ray is clear.  While monitoring in the emergency department the patient's heart rate is normalized.  Her vital signs remained reassuring here.  Troponin and D-dimer remain negative.  She has no significant electrolyte disturbance.  Pregnancy test is negative.  She has follow-up planned with GI next week for esophagitis/GERD.  She will continue her PPI.  Do not want to add significant NSAID to this symptom constellation but in the case of possible MSK chest component we discussed trying topical NSAIDs as well as topical lidocaine as needed.  I have called these into her pharmacy.  She plans to follow with her PCP and GI in the coming week.  ____________________________________________  FINAL CLINICAL IMPRESSION(S) / ED DIAGNOSES  Final diagnoses:  Atypical chest pain     NEW OUTPATIENT MEDICATIONS STARTED DURING THIS VISIT:  Discharge Medication List as of 06/16/2021 12:37 AM  START taking these medications   Details  diclofenac Sodium (VOLTAREN) 1 % GEL Apply 2 g topically 4 (four) times daily as needed., Starting Tue 06/16/2021, Normal    lidocaine (XYLOCAINE) 5 % ointment Apply 1 application topically 3 (three) times daily as needed for moderate pain., Starting Tue 06/16/2021, Normal        Note:  This document was prepared using Dragon voice recognition software and may include unintentional dictation errors.  Alona Bene, MD, [facility]    Aysa Larivee, Arlyss Repress, MD 06/16/21 [phone]

## 2021-06-16 DIAGNOSIS — R079 Chest pain, unspecified: Secondary | ICD-10-CM | POA: Diagnosis not present

## 2021-06-16 DIAGNOSIS — R0602 Shortness of breath: Secondary | ICD-10-CM | POA: Diagnosis not present

## 2021-06-16 DIAGNOSIS — R002 Palpitations: Secondary | ICD-10-CM | POA: Diagnosis not present

## 2021-06-16 LAB — COMPREHENSIVE METABOLIC PANEL
ALT: 20 U/L (ref 0–44)
AST: 20 U/L (ref 15–41)
Albumin: 4.2 g/dL (ref 3.5–5.0)
Alkaline Phosphatase: 57 U/L (ref 38–126)
Anion gap: 8 (ref 5–15)
BUN: 10 mg/dL (ref 6–20)
CO2: 25 mmol/L (ref 22–32)
Calcium: 8.9 mg/dL (ref 8.9–10.3)
Chloride: 103 mmol/L (ref 98–111)
Creatinine, Ser: 0.59 mg/dL (ref 0.44–1.00)
GFR, Estimated: >60 mL/min (ref >60)
Glucose, Bld: 124 mg/dL — ABNORMAL HIGH (ref 70–99)
Potassium: 3.4 mmol/L — ABNORMAL LOW (ref 3.5–5.1)
Sodium: 136 mmol/L (ref 135–145)
Total Bilirubin: 0.7 mg/dL (ref 0.3–1.2)
Total Protein: 7 g/dL (ref 6.5–8.1)

## 2021-06-16 LAB — TROPONIN I (HIGH SENSITIVITY): Troponin I (High Sensitivity): <2 ng/L (ref <18)

## 2021-06-16 MED ORDER — DICLOFENAC SODIUM 1 % EX GEL: 2.0000 g | Freq: Four times a day (QID) | CUTANEOUS | 0 refills | Status: DC | PRN

## 2021-06-16 MED ORDER — LIDOCAINE 5 % EX OINT: 1.0000 "application " | TOPICAL_OINTMENT | Freq: Three times a day (TID) | CUTANEOUS | 0 refills | Status: DC | PRN

## 2021-06-16 NOTE — ED Notes (Signed)
LAC PIV d/c'd, catheter intact, bandage applied.

## 2021-06-16 NOTE — Discharge Instructions (Signed)

## 2021-06-19 DIAGNOSIS — K200 Eosinophilic esophagitis: Secondary | ICD-10-CM | POA: Diagnosis not present

## 2021-06-19 DIAGNOSIS — R0789 Other chest pain: Secondary | ICD-10-CM | POA: Diagnosis not present

## 2021-06-19 DIAGNOSIS — G8929 Other chronic pain: Secondary | ICD-10-CM | POA: Diagnosis not present

## 2021-06-19 DIAGNOSIS — M797 Fibromyalgia: Secondary | ICD-10-CM | POA: Diagnosis not present

## 2021-06-25 DIAGNOSIS — K200 Eosinophilic esophagitis: Secondary | ICD-10-CM | POA: Diagnosis not present

## 2021-06-25 DIAGNOSIS — K219 Gastro-esophageal reflux disease without esophagitis: Secondary | ICD-10-CM | POA: Diagnosis not present

## 2021-06-25 DIAGNOSIS — R079 Chest pain, unspecified: Secondary | ICD-10-CM | POA: Diagnosis not present

## 2021-06-25 DIAGNOSIS — K257 Chronic gastric ulcer without hemorrhage or perforation: Secondary | ICD-10-CM | POA: Diagnosis not present

## 2021-06-29 ENCOUNTER — Other Ambulatory Visit: Payer: Self-pay

## 2021-06-29 ENCOUNTER — Ambulatory Visit (HOSPITAL_COMMUNITY): Payer: BC Managed Care – PPO | Attending: Cardiovascular Disease

## 2021-06-29 ENCOUNTER — Encounter: Payer: Self-pay | Admitting: Cardiology

## 2021-06-29 DIAGNOSIS — R0602 Shortness of breath: Secondary | ICD-10-CM | POA: Diagnosis not present

## 2021-06-29 DIAGNOSIS — R079 Chest pain, unspecified: Secondary | ICD-10-CM | POA: Insufficient documentation

## 2021-06-29 LAB — ECHOCARDIOGRAM COMPLETE
Area-P 1/2: 7.82 cm2
S' Lateral: 2.6 cm

## 2021-07-01 ENCOUNTER — Other Ambulatory Visit: Payer: Self-pay

## 2021-07-01 ENCOUNTER — Encounter (HOSPITAL_COMMUNITY): Payer: Self-pay | Admitting: Oncology

## 2021-07-01 ENCOUNTER — Emergency Department (HOSPITAL_COMMUNITY): Payer: BC Managed Care – PPO

## 2021-07-01 ENCOUNTER — Emergency Department (HOSPITAL_COMMUNITY)
Admission: EM | Admit: 2021-07-01 | Discharge: 2021-07-01 | Disposition: A | Discharge status: Home / Self Care | Payer: BC Managed Care – PPO | Attending: Emergency Medicine | Admitting: Emergency Medicine

## 2021-07-01 DIAGNOSIS — Z5321 Procedure and treatment not carried out due to patient leaving prior to being seen by health care provider: Secondary | ICD-10-CM | POA: Insufficient documentation

## 2021-07-01 DIAGNOSIS — R110 Nausea: Secondary | ICD-10-CM | POA: Insufficient documentation

## 2021-07-01 DIAGNOSIS — R1031 Right lower quadrant pain: Secondary | ICD-10-CM | POA: Diagnosis not present

## 2021-07-01 DIAGNOSIS — K429 Umbilical hernia without obstruction or gangrene: Secondary | ICD-10-CM | POA: Diagnosis not present

## 2021-07-01 LAB — COMPREHENSIVE METABOLIC PANEL
ALT: 20 U/L (ref 0–44)
AST: 21 U/L (ref 15–41)
Albumin: 4.4 g/dL (ref 3.5–5.0)
Alkaline Phosphatase: 59 U/L (ref 38–126)
Anion gap: 7 (ref 5–15)
BUN: 9 mg/dL (ref 6–20)
CO2: 24 mmol/L (ref 22–32)
Calcium: 9.1 mg/dL (ref 8.9–10.3)
Chloride: 108 mmol/L (ref 98–111)
Creatinine, Ser: 0.64 mg/dL (ref 0.44–1.00)
GFR, Estimated: >60 mL/min (ref >60)
Glucose, Bld: 94 mg/dL (ref 70–99)
Potassium: 3.9 mmol/L (ref 3.5–5.1)
Sodium: 139 mmol/L (ref 135–145)
Total Bilirubin: 0.2 mg/dL — ABNORMAL LOW (ref 0.3–1.2)
Total Protein: 7.8 g/dL (ref 6.5–8.1)

## 2021-07-01 LAB — CBC
HCT: 42.8 % (ref 36.0–46.0)
Hemoglobin: 13.7 g/dL (ref 12.0–15.0)
MCH: 28.2 pg (ref 26.0–34.0)
MCHC: 32 g/dL (ref 30.0–36.0)
MCV: 88.1 fL (ref 80.0–100.0)
Platelets: 263 10*3/uL (ref 150–400)
RBC: 4.86 MIL/uL (ref 3.87–5.11)
RDW: 13.3 % (ref 11.5–15.5)
WBC: 9.8 10*3/uL (ref 4.0–10.5)
nRBC: 0 % (ref 0.0–0.2)

## 2021-07-01 LAB — URINALYSIS, ROUTINE W REFLEX MICROSCOPIC
Bacteria, UA: NONE SEEN
Bilirubin Urine: NEGATIVE
Glucose, UA: NEGATIVE mg/dL
Ketones, ur: NEGATIVE mg/dL
Leukocytes,Ua: NEGATIVE
Nitrite: NEGATIVE
Protein, ur: NEGATIVE mg/dL
Specific Gravity, Urine: 1.01 (ref 1.005–1.030)
pH: 7 (ref 5.0–8.0)

## 2021-07-01 LAB — I-STAT BETA HCG BLOOD, ED (MC, WL, AP ONLY): I-stat hCG, quantitative: <5 m[IU]/mL (ref <5)

## 2021-07-01 LAB — LIPASE, BLOOD: Lipase: 30 U/L (ref 11–51)

## 2021-07-01 NOTE — ED Triage Notes (Signed)
Pt c/o right lower abdominal pain that began yesterday.  Reports nausea and poor appetite.

## 2021-07-01 NOTE — ED Notes (Signed)
Patient has a urine culture in the main lab 

## 2021-07-01 NOTE — ED Provider Notes (Signed)
Emergency Medicine Provider Triage Evaluation Note  Stefanie Lucero , a 30 y.o. female  was evaluated in triage.  Pt complains of rlq abd pain that started gradually yesterday.  Review of Systems  Positive: Abd pain, nausea Negative: Vomiting, vaginal bleeding  Physical Exam  BP 133/87 (BP Location: Left Arm)   Pulse (!) 110   Temp 98.2 F (36.8 C)   Resp 16   Ht 5\' 3"  (1.6 m)   Wt 83.9 kg   LMP 06/02/2021 (Approximate)   SpO2 98%   BMI 32.77 kg/m  Gen:   Awake, no distress   Resp:  Normal effort  MSK:   Moves extremities without difficulty  Other:  Rlq ttp  Medical Decision Making  Medically screening exam initiated at 4:29 PM.  Appropriate orders placed.  Stefanie Lucero was informed that the remainder of the evaluation will be completed by another provider, this initial triage assessment does not replace that evaluation, and the importance of remaining in the ED until their evaluation is complete.     [name], PA-C 07/01/21 1630    14/07/22, MD 07/02/21 [phone]

## 2021-07-02 NOTE — Progress Notes (Signed)
Pt has been made aware of normal result and verbalized understanding.  jw

## 2021-07-06 DIAGNOSIS — R0789 Other chest pain: Secondary | ICD-10-CM | POA: Diagnosis not present

## 2021-07-06 DIAGNOSIS — K200 Eosinophilic esophagitis: Secondary | ICD-10-CM | POA: Diagnosis not present

## 2021-07-06 DIAGNOSIS — K2950 Unspecified chronic gastritis without bleeding: Secondary | ICD-10-CM | POA: Diagnosis not present

## 2021-07-06 NOTE — Progress Notes (Signed)
NEUROLOGY CONSULTATION NOTE  CRYSTA GULICK MRN: [id number] DOB: 1990-11-27  Referring provider: Lorenda Ishihara, MD Primary care provider: Drema Halon, FNP  Reason for consult:  migraine  Assessment/Plan:   Near-syncope - if cardiac etiology ruled out, consider migraine-related Migraine with and without aura, without status migrainosus, not intractable  Check MRI of brain with and without contrast Check routine EEG Migraine prevention:  start nortriptyline 10mg  at bedtime.  We can increase to 25mg  at bedtime in 4 months Migraine rescue:  Stop sumatriptan.  She will try Maxalt-MLT 10mg .  Zofran for nausea Limit use of pain relievers to no more than 2 days out of week to prevent risk of rebound or medication-overuse headache. Keep headache diary Follow up 7-8 months    Subjective:  Stefanie Lucero is a 30 year old female with asthma and anxiety who presents for migraines.  History supplemented by referring provider's note.  She has had migraines since age 30.  They were severe until age 69.  They now occur 1 or 2 times a month.  They are severe bilateral fronto-temporal pressure/throbbing with nausea, photophobia, phonophobia, as well as visual aura (sees rings of light or blurred vision).  They typically last 24 hours (2-3 hours to 3 days).  Takes sumatriptan and goes to sleep.  If at work, only Tylenol.  Since September, she started having dizzy spells.  It happens at any time, with or without movement.  It happens when driving, sitting or standing.  She develops severe ringing in ears and everybody sounds like they are talking slowly.  She feels nauseous and feels like she is going to pass out but doesn't lose consciousness.  No associated headaches, diaphoresis, or palpitations.  She looks like she has a blank stare.  They last 5-10 minutes.  They have been occurring once to twice a week.  She has tried not to skip meals now.   CT head without contrast on 03/08/2021  showed "no acute intracranial abnormality".  CTV of head on 10/09/2020 personally reviewed was negative for dural sinus thrombosis.  CT head on 07/04/2016 personally reviewed was normal  Current NSAIDS/analgesics:  acetaminophen Current triptans:  sumatriptan 50mg  Current ergotamine:  none Current anti-emetic:  Zofran 4mg  Current muscle relaxants:  none Current Antihypertensive medications:  none Current Antidepressant medications:  none Current Anticonvulsant medications:  none Current anti-CGRP:  none Current Vitamins/Herbal/Supplements:  none Current Antihistamines/Decongestants:  meclizine Other therapy:  none Hormone/birth control:  none  Past NSAIDS/analgesics:  ibuprofen (GERD) Past abortive triptans:  none Past abortive ergotamine:  none Past muscle relaxants:  none Past anti-emetic:  none Past antihypertensive medications:  atenolol Past antidepressant medications:  duloxetine, sertraline Past anticonvulsant medications:  topiramate Past anti-CGRP:  none Past vitamins/Herbal/Supplements:  none Past antihistamines/decongestants:  loratidine, cetirizine, pseudoephedrine, Flonase Other past therapies:  none  Caffeine:  1 to 2 diet sodas daily.  No coffee Diet:  at least 30 oz water daily.  1-2 diet sodas daily.  Now tries not to skip meals Exercise:  none Depression:  yes.  Stable; Anxiety:  yes Other pain:  Central chest and bilateral shoulder pain (query fibromyalgia Sleep hygiene:  9 PM to 5:30 AM.  Family history of migraines:  No      PAST MEDICAL HISTORY: Past Medical History:  Diagnosis Date   Anxiety    Anxiety    Asthma    Eosinophilic esophagitis    Gastric ulcer    Gastritis    Headaches, cluster  Migraine    Migraine headache    Pancreatic mass    STD (sexually transmitted disease)    trichomonas treated 2020    PAST SURGICAL HISTORY: Past Surgical History:  Procedure Laterality Date   CESAREAN SECTION     x 2   INTRAUTERINE DEVICE  (IUD) INSERTION     mirena inserted 12-07-2019   TONSILLECTOMY  2001    MEDICATIONS: Current Outpatient Medications on File Prior to Visit  Medication Sig Dispense Refill   acetaminophen (TYLENOL) 500 MG tablet Take 1,000 mg by mouth every 6 (six) hours as needed for moderate pain or headache.     albuterol (VENTOLIN HFA) 108 (90 Base) MCG/ACT inhaler Inhale 2 puffs into the lungs every 4 (four) hours as needed for wheezing or shortness of breath.     diclofenac Sodium (VOLTAREN) 1 % GEL Apply 2 g topically 4 (four) times daily as needed. 100 g 0   famotidine (PEPCID) 40 MG tablet Take 1 tablet (40 mg total) by mouth daily. 30 tablet 2   fluticasone (FLOVENT HFA) 220 MCG/ACT inhaler Spray medication into mouth then swallow.  Repeat for total of 2 sprays.  Do not inhale medication.  Do not eat or drink for 30 minutes after administering medication.  It is okay to continue swallowing saliva during this 30 minutes.  Perform this treatment twice daily until follow-up with gastroenterology. 1 each 2   ibuprofen (ADVIL) 200 MG tablet Take 800 mg by mouth every 6 (six) hours as needed for headache or moderate pain.     lansoprazole (PREVACID) 30 MG capsule Take 1 capsule (30 mg total) by mouth 2 (two) times daily before a meal. 60 capsule 2   lidocaine (XYLOCAINE) 5 % ointment Apply 1 application topically 3 (three) times daily as needed for moderate pain. 35.44 g 0   meclizine (ANTIVERT) 25 MG tablet Take 25 mg by mouth 2 (two) times daily as needed for dizziness.     ondansetron (ZOFRAN ODT) 4 MG disintegrating tablet Take 1 tablet (4 mg total) by mouth every 8 (eight) hours as needed for nausea or vomiting. 20 tablet 0   oseltamivir (TAMIFLU) 75 MG capsule Take 1 capsule (75 mg total) by mouth every 12 (twelve) hours. 10 capsule 0   sertraline (ZOLOFT) 50 MG tablet Take 50 mg by mouth daily.     SUMAtriptan (IMITREX) 50 MG tablet Take 50 mg by mouth daily as needed for migraine.     [DISCONTINUED]  etonogestrel-ethinyl estradiol (NUVARING) 0.12-0.015 MG/24HR vaginal ring Place 1 each vaginally every 28 (twenty-eight) days. Insert vaginally and leave in place for 3 consecutive weeks, then remove for 1 week. (Patient not taking: No sig reported) 3 each 3   No current facility-administered medications on file prior to visit.    ALLERGIES: Allergies  Allergen Reactions   Eggs Or Egg-Derived Products Anaphylaxis and Hives   Nutmeg Oil (Myristica Oil) Anaphylaxis   Cefdinir Hives   Ceftin [Cefuroxime] Hives   Guggulipid-Black Pepper Hives    & White Pepper    Iodine Other (See Comments)    Pt states "mouth itches"   Nickel Rash    FAMILY HISTORY: Family History  Problem Relation Age of Onset   Hypertension Mother    Heart attack Father        times 2   Hypertension Father    Alzheimer's disease Maternal Grandmother    Irregular heart beat Maternal Grandmother    Stroke Maternal Grandfather    Gallbladder disease  Maternal Grandfather    Colon cancer Paternal Grandfather 48            Objective:  Blood pressure 113/72, pulse 95, height 5\' 3"  (1.6 m), weight 184 lb 9.6 oz (83.7 kg), SpO2 100 %. General: No acute distress.  Patient appears well-groomed.   Head:  Normocephalic/atraumatic Eyes:  fundi examined but not visualized Neck: supple, no paraspinal tenderness, full range of motion Back: No paraspinal tenderness Heart: regular rate and rhythm Lungs: Clear to auscultation bilaterally. Vascular: No carotid bruits. Neurological Exam: Mental status: alert and oriented to person, place, and time, recent and remote memory intact, fund of knowledge intact, attention and concentration intact, speech fluent and not dysarthric, language intact. Cranial nerves: CN I: not tested CN II: pupils equal, round and reactive to light, visual fields intact CN III, IV, VI:  full range of motion, no nystagmus, no ptosis CN V: facial sensation intact. CN VII: upper and lower face  symmetric CN VIII: hearing intact CN IX, X: gag intact, uvula midline CN XI: sternocleidomastoid and trapezius muscles intact CN XII: tongue midline Bulk & Tone: normal, no fasciculations. Motor:  muscle strength 5/5 throughout Sensation:  Pinprick, temperature and vibratory sensation intact. Deep Tendon Reflexes:  2+ throughout,  toes downgoing.   Finger to nose testing:  Without dysmetria.   Heel to shin:  Without dysmetria.   Gait:  Normal station and stride.  Romberg negative.    Thank you for allowing me to take part in the care of this patient.  , DO

## 2021-07-07 ENCOUNTER — Ambulatory Visit (INDEPENDENT_AMBULATORY_CARE_PROVIDER_SITE_OTHER): Payer: BC Managed Care – PPO | Admitting: Neurology

## 2021-07-07 ENCOUNTER — Other Ambulatory Visit: Payer: Self-pay

## 2021-07-07 ENCOUNTER — Encounter: Payer: Self-pay | Admitting: Neurology

## 2021-07-07 VITALS — BP 113/72 | HR 95 | Ht 63.0 in | Wt 184.6 lb

## 2021-07-07 DIAGNOSIS — G43009 Migraine without aura, not intractable, without status migrainosus: Secondary | ICD-10-CM | POA: Diagnosis not present

## 2021-07-07 DIAGNOSIS — R55 Syncope and collapse: Secondary | ICD-10-CM

## 2021-07-07 NOTE — Patient Instructions (Addendum)
  Check MRI of brain with and without contrast Check routine EEG Start nortriptyline 10mg  at bedtime.  Contact in 4 weeks with update and we can increase dose if needed. Stop sumatriptan.  Take rizatriptan 10mg  at earliest onset of headache.  May repeat dose once in 2 hours if needed.  Maximum 2 tablets in 24 hours. Zofran for nausea Limit use of pain relievers to no more than 2 days out of the week.  These medications include acetaminophen, NSAIDs (ibuprofen/Advil/Motrin, naproxen/Aleve, triptans (Imitrex/sumatriptan), Excedrin, and narcotics.  This will help reduce risk of rebound headaches. Be aware of common food triggers:  - Caffeine:  coffee, black tea, cola, Mt. Dew  - Chocolate  - Dairy:  aged cheeses (brie, blue, cheddar, gouda, [place], provolone, [place], Swiss, etc), chocolate milk, buttermilk, sour cream, limit eggs and yogurt  - Nuts, peanut butter  - Alcohol  - Cereals/grains:  FRESH breads (fresh bagels, sourdough, doughnuts), yeast productions  - Processed/canned/aged/cured meats (pre-packaged deli meats, hotdogs)  - MSG/glutamate:  soy sauce, flavor enhancer, pickled/preserved/marinated foods  - Sweeteners:  aspartame (Equal, Nutrasweet).  Sugar and Splenda are okay  - Vegetables:  legumes (lima beans, lentils, snow peas, fava beans, pinto peans, peas, garbanzo beans), sauerkraut, onions, olives, pickles  - Fruit:  avocados, bananas, citrus fruit (orange, lemon, grapefruit), mango  - Other:  Frozen meals, macaroni and cheese Routine exercise Stay adequately hydrated (aim for 64 oz water daily) Keep headache diary Maintain proper stress management Maintain proper sleep hygiene Do not skip meals Follow up 7-8 months. Consider supplements:  magnesium citrate 400mg  daily, riboflavin 400mg  daily, coenzyme Q10 100mg  three times daily.

## 2021-07-13 ENCOUNTER — Other Ambulatory Visit: Payer: Self-pay | Admitting: Gastroenterology

## 2021-07-24 ENCOUNTER — Emergency Department (HOSPITAL_COMMUNITY)
Admission: EM | Admit: 2021-07-24 | Discharge: 2021-07-24 | Disposition: A | Discharge status: Home / Self Care | Payer: BC Managed Care – PPO | Attending: Emergency Medicine | Admitting: Emergency Medicine

## 2021-07-24 ENCOUNTER — Encounter (HOSPITAL_COMMUNITY): Payer: Self-pay

## 2021-07-24 DIAGNOSIS — R112 Nausea with vomiting, unspecified: Secondary | ICD-10-CM | POA: Diagnosis not present

## 2021-07-24 DIAGNOSIS — R102 Pelvic and perineal pain: Secondary | ICD-10-CM | POA: Diagnosis not present

## 2021-07-24 DIAGNOSIS — R197 Diarrhea, unspecified: Secondary | ICD-10-CM | POA: Diagnosis not present

## 2021-07-24 DIAGNOSIS — K5900 Constipation, unspecified: Secondary | ICD-10-CM | POA: Diagnosis not present

## 2021-07-24 DIAGNOSIS — Z5321 Procedure and treatment not carried out due to patient leaving prior to being seen by health care provider: Secondary | ICD-10-CM | POA: Diagnosis not present

## 2021-07-24 DIAGNOSIS — R1031 Right lower quadrant pain: Secondary | ICD-10-CM | POA: Diagnosis not present

## 2021-07-24 LAB — CBC
HCT: 39.7 % (ref 36.0–46.0)
Hemoglobin: 12.9 g/dL (ref 12.0–15.0)
MCH: 28.2 pg (ref 26.0–34.0)
MCHC: 32.5 g/dL (ref 30.0–36.0)
MCV: 86.9 fL (ref 80.0–100.0)
Platelets: 272 K/uL (ref 150–400)
RBC: 4.57 MIL/uL (ref 3.87–5.11)
RDW: 13.8 % (ref 11.5–15.5)
WBC: 11 K/uL — ABNORMAL HIGH (ref 4.0–10.5)
nRBC: 0 % (ref 0.0–0.2)

## 2021-07-24 LAB — URINALYSIS, ROUTINE W REFLEX MICROSCOPIC
Bacteria, UA: NONE SEEN
Bilirubin Urine: NEGATIVE
Glucose, UA: NEGATIVE mg/dL
Ketones, ur: 5 mg/dL — AB
Leukocytes,Ua: NEGATIVE
Nitrite: NEGATIVE
Protein, ur: NEGATIVE mg/dL
Specific Gravity, Urine: 1.011 (ref 1.005–1.030)
pH: 7 (ref 5.0–8.0)

## 2021-07-24 LAB — COMPREHENSIVE METABOLIC PANEL WITH GFR
ALT: 23 U/L (ref 0–44)
AST: 23 U/L (ref 15–41)
Albumin: 4.3 g/dL (ref 3.5–5.0)
Alkaline Phosphatase: 62 U/L (ref 38–126)
Anion gap: 8 (ref 5–15)
BUN: 9 mg/dL (ref 6–20)
CO2: 22 mmol/L (ref 22–32)
Calcium: 9.1 mg/dL (ref 8.9–10.3)
Chloride: 107 mmol/L (ref 98–111)
Creatinine, Ser: 0.59 mg/dL (ref 0.44–1.00)
GFR, Estimated: >60 mL/min
Glucose, Bld: 110 mg/dL — ABNORMAL HIGH (ref 70–99)
Potassium: 3.3 mmol/L — ABNORMAL LOW (ref 3.5–5.1)
Sodium: 137 mmol/L (ref 135–145)
Total Bilirubin: 0.7 mg/dL (ref 0.3–1.2)
Total Protein: 7.5 g/dL (ref 6.5–8.1)

## 2021-07-24 LAB — I-STAT BETA HCG BLOOD, ED (MC, WL, AP ONLY): I-stat hCG, quantitative: <5 m[IU]/mL

## 2021-07-24 LAB — LIPASE, BLOOD: Lipase: 27 U/L (ref 11–51)

## 2021-07-24 MED ORDER — OXYCODONE-ACETAMINOPHEN 5-325 MG PO TABS
1.0000 | ORAL_TABLET | Freq: Once | ORAL | Status: AC
  Administered 2021-07-24: 04:00:00 1 via ORAL
  Filled 2021-07-24: qty 1

## 2021-07-24 NOTE — ED Triage Notes (Signed)
Patient from home with c/o mid abdominal pain that is radiating to right lower abdomen and pelvic area. Patient states it hurts to walk and reports increased pelvic pain with urination. Pt endorses N/V/D

## 2021-08-01 ENCOUNTER — Ambulatory Visit
Admission: RE | Admit: 2021-08-01 | Discharge: 2021-08-01 | Disposition: A | Discharge status: Home / Self Care | Payer: BC Managed Care – PPO | Source: Ambulatory Visit | Attending: Neurology | Admitting: Neurology

## 2021-08-01 ENCOUNTER — Other Ambulatory Visit: Payer: Self-pay

## 2021-08-01 DIAGNOSIS — G43009 Migraine without aura, not intractable, without status migrainosus: Secondary | ICD-10-CM

## 2021-08-01 DIAGNOSIS — R55 Syncope and collapse: Secondary | ICD-10-CM

## 2021-08-01 MED ORDER — GADOBENATE DIMEGLUMINE 529 MG/ML IV SOLN
15.0000 mL | Freq: Once | INTRAVENOUS | Status: AC | PRN
  Administered 2021-08-01: 15 mL via INTRAVENOUS

## 2021-08-04 ENCOUNTER — Ambulatory Visit: Payer: BC Managed Care – PPO | Admitting: Neurology

## 2021-08-05 ENCOUNTER — Ambulatory Visit: Payer: BC Managed Care – PPO | Admitting: Nurse Practitioner

## 2021-08-05 NOTE — Progress Notes (Signed)
Pt advised of her MRI results and to keep her shceduled appt with EEG.

## 2021-08-07 ENCOUNTER — Ambulatory Visit: Payer: BC Managed Care – PPO | Admitting: Obstetrics and Gynecology

## 2021-08-11 ENCOUNTER — Other Ambulatory Visit: Payer: BC Managed Care – PPO

## 2021-08-18 ENCOUNTER — Ambulatory Visit: Payer: BC Managed Care – PPO | Admitting: Obstetrics and Gynecology

## 2021-08-19 DIAGNOSIS — D224 Melanocytic nevi of scalp and neck: Secondary | ICD-10-CM | POA: Diagnosis not present

## 2021-08-19 DIAGNOSIS — L309 Dermatitis, unspecified: Secondary | ICD-10-CM | POA: Diagnosis not present

## 2021-08-19 DIAGNOSIS — D485 Neoplasm of uncertain behavior of skin: Secondary | ICD-10-CM | POA: Diagnosis not present

## 2021-08-23 ENCOUNTER — Emergency Department (HOSPITAL_BASED_OUTPATIENT_CLINIC_OR_DEPARTMENT_OTHER)
Admission: EM | Admit: 2021-08-23 | Discharge: 2021-08-23 | Disposition: A | Discharge status: Home / Self Care | Payer: BC Managed Care – PPO | Attending: Emergency Medicine | Admitting: Emergency Medicine

## 2021-08-23 ENCOUNTER — Other Ambulatory Visit: Payer: Self-pay

## 2021-08-23 ENCOUNTER — Encounter (HOSPITAL_BASED_OUTPATIENT_CLINIC_OR_DEPARTMENT_OTHER): Payer: Self-pay

## 2021-08-23 ENCOUNTER — Emergency Department (HOSPITAL_BASED_OUTPATIENT_CLINIC_OR_DEPARTMENT_OTHER): Payer: BC Managed Care – PPO

## 2021-08-23 DIAGNOSIS — K219 Gastro-esophageal reflux disease without esophagitis: Secondary | ICD-10-CM

## 2021-08-23 DIAGNOSIS — R072 Precordial pain: Secondary | ICD-10-CM | POA: Insufficient documentation

## 2021-08-23 DIAGNOSIS — R0602 Shortness of breath: Secondary | ICD-10-CM | POA: Diagnosis not present

## 2021-08-23 DIAGNOSIS — R0789 Other chest pain: Secondary | ICD-10-CM | POA: Diagnosis not present

## 2021-08-23 DIAGNOSIS — R110 Nausea: Secondary | ICD-10-CM | POA: Diagnosis not present

## 2021-08-23 DIAGNOSIS — R12 Heartburn: Secondary | ICD-10-CM | POA: Insufficient documentation

## 2021-08-23 LAB — PREGNANCY, URINE: Preg Test, Ur: NEGATIVE

## 2021-08-23 LAB — TROPONIN I (HIGH SENSITIVITY): Troponin I (High Sensitivity): <2 ng/L (ref <18)

## 2021-08-23 MED ORDER — ONDANSETRON 4 MG PO TBDP
8.0000 mg | ORAL_TABLET | Freq: Once | ORAL | Status: AC
  Administered 2021-08-23: 8 mg via ORAL
  Filled 2021-08-23: qty 2

## 2021-08-23 MED ORDER — SUCRALFATE 1 GM/10ML PO SUSP: 1.0000 g | Freq: Three times a day (TID) | ORAL | 0 refills | Status: DC

## 2021-08-23 MED ORDER — ALUM & MAG HYDROXIDE-SIMETH 200-200-20 MG/5ML PO SUSP
30.0000 mL | Freq: Once | ORAL | Status: AC
Start: 2021-08-23 — End: 2021-08-23
  Administered 2021-08-23: 30 mL via ORAL
  Filled 2021-08-23: qty 30

## 2021-08-23 MED ORDER — SUCRALFATE 1 GM/10ML PO SUSP
1.0000 g | Freq: Three times a day (TID) | ORAL | Status: DC
  Administered 2021-08-23: 1 g via ORAL
  Filled 2021-08-23: qty 10

## 2021-08-23 MED ORDER — LIDOCAINE VISCOUS HCL 2 % MT SOLN
15.0000 mL | Freq: Once | OROMUCOSAL | Status: AC
  Administered 2021-08-23: 15 mL via ORAL
  Filled 2021-08-23: qty 15

## 2021-08-23 NOTE — ED Triage Notes (Signed)
Pt c/o intermittent chest pressure & shortness of breath since Monday. Pt reports that her heart has been "racing" today, nausea & dry mouth.

## 2021-08-23 NOTE — ED Provider Notes (Signed)
[facility] EMERGENCY DEPARTMENT Provider Note   CSN: [id number] Arrival date & time: 08/23/21  0254     History  Chief Complaint  Patient presents with   Chest Pain    Stefanie Lucero is a 31 y.o. female.  The history is provided by the patient.  Chest Pain Pain location:  Substernal area Pain quality: burning and pressure   Pain radiates to:  Does not radiate Pain severity:  Moderate Onset quality:  Gradual Duration:  6 days Timing:  Constant Progression:  Unchanged Chronicity:  Recurrent Context: not movement   Context comment:  Has gerd Relieved by:  Nothing Worsened by:  Nothing Ineffective treatments:  None tried Associated symptoms: heartburn and nausea   Associated symptoms: no fatigue, no fever, no headache, no lower extremity edema, no orthopnea, no shortness of breath and no weakness   Risk factors: no birth control, no diabetes mellitus and no hypertension       Home Medications Prior to Admission medications   Medication Sig Start Date End Date Taking? Authorizing Provider  acetaminophen (TYLENOL) 500 MG tablet Take 1,000 mg by mouth every 6 (six) hours as needed for moderate pain or headache.    [provider]  albuterol (VENTOLIN HFA) 108 (90 Base) MCG/ACT inhaler Inhale 2 puffs into the lungs every 4 (four) hours as needed for wheezing or shortness of breath. 11/06/20   [provider]  diclofenac Sodium (VOLTAREN) 1 % GEL Apply 2 g topically 4 (four) times daily as needed. Patient not taking: Reported on 07/07/2021 06/16/21   Maia Plan, MD  famotidine (PEPCID) 40 MG tablet Take 1 tablet (40 mg total) by mouth daily. Patient not taking: Reported on 07/07/2021 05/25/21 08/23/21  Theadora Rama Scales, PA-C  fluticasone (FLOVENT HFA) 220 MCG/ACT inhaler Spray medication into mouth then swallow.  Repeat for total of 2 sprays.  Do not inhale medication.  Do not eat or drink for 30 minutes after administering medication.  It  is okay to continue swallowing saliva during this 30 minutes.  Perform this treatment twice daily until follow-up with gastroenterology. Patient not taking: Reported on 07/07/2021 05/25/21   Theadora Rama Scales, PA-C  ibuprofen (ADVIL) 200 MG tablet Take 800 mg by mouth every 6 (six) hours as needed for headache or moderate pain.    [provider]  lansoprazole (PREVACID) 30 MG capsule Take 1 capsule (30 mg total) by mouth 2 (two) times daily before a meal. 05/25/21 08/23/21  Theadora Rama Scales, PA-C  lidocaine (XYLOCAINE) 5 % ointment Apply 1 application topically 3 (three) times daily as needed for moderate pain. Patient not taking: Reported on 07/07/2021 06/16/21   Maia Plan, MD  meclizine (ANTIVERT) 25 MG tablet Take 25 mg by mouth 2 (two) times daily as needed for dizziness.    [provider]  ondansetron (ZOFRAN ODT) 4 MG disintegrating tablet Take 1 tablet (4 mg total) by mouth every 8 (eight) hours as needed for nausea or vomiting. 06/10/21   Rancour, Jeannett Senior, MD  oseltamivir (TAMIFLU) 75 MG capsule Take 1 capsule (75 mg total) by mouth every 12 (twelve) hours. 05/29/21   Smoot, Shawn Route, PA-C  sertraline (ZOLOFT) 50 MG tablet Take 50 mg by mouth daily. 04/23/21   [provider]  SUMAtriptan (IMITREX) 50 MG tablet Take 50 mg by mouth daily as needed for migraine. 04/23/21   [provider]  etonogestrel-ethinyl estradiol (NUVARING) 0.12-0.015 MG/24HR vaginal ring Place 1 each vaginally every 28 (twenty-eight)  days. Insert vaginally and leave in place for 3 consecutive weeks, then remove for 1 week. Patient not taking: No sig reported 10/02/20 11/12/20  Wyline BeadyWallace, Tiffany A, NP      Allergies    Eggs or egg-derived products, Nutmeg oil (myristica oil), Cefdinir, Ceftin [cefuroxime], Guggulipid-black pepper, Iodine, and Nickel    Review of Systems   Review of Systems  Constitutional:  Negative for fatigue and fever.  HENT:  Negative for facial  swelling.   Eyes:  Negative for photophobia.  Respiratory:  Negative for shortness of breath.   Cardiovascular:  Positive for chest pain. Negative for orthopnea.  Gastrointestinal:  Positive for heartburn and nausea.  Genitourinary:  Negative for difficulty urinating.  Musculoskeletal:  Negative for neck stiffness.  Skin:  Negative for rash.  Neurological:  Negative for weakness and headaches.  Psychiatric/Behavioral:  Negative for agitation.   All other systems reviewed and are negative.  Physical Exam Updated Vital Signs BP 111/84    Pulse 87    Temp 98 F (36.7 C) (Oral)    Resp 20    Ht 5\' 3"  (1.6 m)    Wt 83.9 kg    SpO2 100%    BMI 32.77 kg/m  Physical Exam Vitals and nursing note reviewed.  Constitutional:      General: She is not in acute distress.    Appearance: Normal appearance.  HENT:     Head: Normocephalic and atraumatic.     Nose: Nose normal.  Eyes:     Conjunctiva/sclera: Conjunctivae normal.     Pupils: Pupils are equal, round, and reactive to light.  Cardiovascular:     Rate and Rhythm: Normal rate and regular rhythm.     Pulses: Normal pulses.     Heart sounds: Normal heart sounds.  Pulmonary:     Effort: Pulmonary effort is normal.     Breath sounds: Normal breath sounds.  Abdominal:     General: Abdomen is flat. Bowel sounds are normal.     Palpations: Abdomen is soft.     Tenderness: There is no abdominal tenderness. There is no guarding.  Musculoskeletal:        General: Normal range of motion.     Cervical back: Normal range of motion and neck supple.     Right lower leg: No edema.     Left lower leg: No edema.  Skin:    General: Skin is warm and dry.     Capillary Refill: Capillary refill takes less than 2 seconds.  Neurological:     General: No focal deficit present.     Mental Status: She is alert and oriented to person, place, and time.     Deep Tendon Reflexes: Reflexes normal.  Psychiatric:        Mood and Affect: Mood normal.         Behavior: Behavior normal.    ED Results / Procedures / Treatments   Labs (all labs ordered are listed, but only abnormal results are displayed) Results for orders placed or performed during the hospital encounter of 08/23/21  Pregnancy, urine  Result Value Ref Range   Preg Test, Ur NEGATIVE NEGATIVE  Troponin I (High Sensitivity)  Result Value Ref Range   Troponin I (High Sensitivity) <2 <18 ng/L   MR BRAIN W WO CONTRAST  Result Date: 08/01/2021 CLINICAL DATA:  near syncope EXAM: MRI HEAD WITHOUT AND WITH CONTRAST TECHNIQUE: Multiplanar, multiecho pulse sequences of the brain and surrounding structures were obtained without and  with intravenous contrast. CONTRAST:  15mL MULTIHANCE GADOBENATE DIMEGLUMINE 529 MG/ML IV SOLN COMPARISON:  None. FINDINGS: Brain: No acute infarction, hemorrhage, hydrocephalus, extra-axial collection or mass lesion. No abnormal enhancement. Vascular: Major arterial flow voids are maintained at the skull base. Skull and upper cervical spine: Normal marrow signal. Sinuses/Orbits: Clear sinuses.  Unremarkable orbits. Other: No mastoid effusions. IMPRESSION: Normal brain MRI.  No acute abnormality. Electronically Signed   By: Feliberto HartsFrederick S Jones M.D.   On: 08/01/2021 19:26   DG Abdomen Acute W/Chest  Result Date: 08/23/2021 CLINICAL DATA:  Intermittent chest pressure and shortness of breath EXAM: DG ABDOMEN ACUTE WITH 1 VIEW CHEST COMPARISON:  Abdominal CT 07/01/2021 FINDINGS: There is no evidence of dilated bowel loops or free intraperitoneal air. No radiopaque calculi or other significant radiographic abnormality is seen. Moderate stool seen mainly over the right colon. Heart size and mediastinal contours are within normal limits. Both lungs are clear.Extensive artifact from EKG leads over the abdomen. IMPRESSION: Negative abdominal radiographs.  No acute cardiopulmonary disease. Electronically Signed   By: Tiburcio PeaJonathan  Watts M.D.   On: 08/23/2021 04:43     EKG EKG  Interpretation  Date/Time:  Sunday August 23 2021 03:21:13 EST Ventricular Rate:  85 PR Interval:  153 QRS Duration: 70 QT Interval:  368 QTC Calculation: 438 R Axis:   56 Text Interpretation: Sinus rhythm Low voltage, precordial leads Confirmed by Nicanor AlconPalumbo, Nyeshia Mysliwiec ([id number]) on 08/23/2021 4:46:42 AM  Radiology DG Abdomen Acute W/Chest  Result Date: 08/23/2021 CLINICAL DATA:  Intermittent chest pressure and shortness of breath EXAM: DG ABDOMEN ACUTE WITH 1 VIEW CHEST COMPARISON:  Abdominal CT 07/01/2021 FINDINGS: There is no evidence of dilated bowel loops or free intraperitoneal air. No radiopaque calculi or other significant radiographic abnormality is seen. Moderate stool seen mainly over the right colon. Heart size and mediastinal contours are within normal limits. Both lungs are clear.Extensive artifact from EKG leads over the abdomen. IMPRESSION: Negative abdominal radiographs.  No acute cardiopulmonary disease. Electronically Signed   By: Tiburcio PeaJonathan  Watts M.D.   On: 08/23/2021 04:43    Procedures Procedures    Medications Ordered in ED Medications  sucralfate (CARAFATE) 1 GM/10ML suspension 1 g (1 g Oral Given 08/23/21 0325)  alum & mag hydroxide-simeth (MAALOX/MYLANTA) 200-200-20 MG/5ML suspension 30 mL (30 mLs Oral Given 08/23/21 0326)    And  lidocaine (XYLOCAINE) 2 % viscous mouth solution 15 mL (15 mLs Oral Given 08/23/21 0326)  ondansetron (ZOFRAN-ODT) disintegrating tablet 8 mg (8 mg Oral Given 08/23/21 0327)    ED Course/ Medical Decision Making/ A&P                           Medical Decision Making Gerd symptoms with nausea ongoing, unrelieved by home prevacid.  Scheduled to see GI for these symptoms on Monday.    Amount and/or Complexity of Data Reviewed Labs: ordered.    Details: negative troponjn Radiology: ordered.    Details: normal xrays  Risk OTC drugs. Prescription drug management. Risk Details: Patient ruled out for MI with negative EKG and troponin.  Given  time course only one troponin was necessary.  HEART score 1 very low risk for mace.  PERC negative wells 0, highly doubt PE in this low risk patient.  Will add carafate.  Keep your appointment with GI    Final Clinical Impression(s) / ED Diagnoses Final diagnoses:  None  Return for intractable cough, coughing up blood, fevers > 100.4 unrelieved by medication,  shortness of breath, intractable vomiting, chest pain, shortness of breath, weakness, numbness, changes in speech, facial asymmetry, abdominal pain, passing out, Inability to tolerate liquids or food, cough, altered mental status or any concerns. No signs of systemic illness or infection. The patient is nontoxic-appearing on exam and vital signs are within normal limits.  I have reviewed the triage vital signs and the nursing notes. Pertinent labs & imaging results that were available during my care of the patient were reviewed by me and considered in my medical decision making (see chart for details). After history, exam, and medical workup I feel the patient has been appropriately medically screened and is safe for discharge home. Pertinent diagnoses were discussed with the patient. Patient was given return precautions.       Shiryl Ruddy, MD 08/23/21 1937

## 2021-08-24 ENCOUNTER — Other Ambulatory Visit: Payer: BC Managed Care – PPO

## 2021-08-24 DIAGNOSIS — K200 Eosinophilic esophagitis: Secondary | ICD-10-CM | POA: Diagnosis not present

## 2021-08-24 DIAGNOSIS — K219 Gastro-esophageal reflux disease without esophagitis: Secondary | ICD-10-CM | POA: Diagnosis not present

## 2021-08-25 ENCOUNTER — Encounter: Payer: Self-pay | Admitting: Neurology

## 2021-08-25 DIAGNOSIS — Z029 Encounter for administrative examinations, unspecified: Secondary | ICD-10-CM

## 2021-08-26 ENCOUNTER — Ambulatory Visit: Payer: BC Managed Care – PPO | Admitting: Nurse Practitioner

## 2021-08-27 DIAGNOSIS — R079 Chest pain, unspecified: Secondary | ICD-10-CM | POA: Diagnosis not present

## 2021-08-27 DIAGNOSIS — R0789 Other chest pain: Secondary | ICD-10-CM | POA: Diagnosis not present

## 2021-09-07 DIAGNOSIS — M7502 Adhesive capsulitis of left shoulder: Secondary | ICD-10-CM | POA: Diagnosis not present

## 2021-09-07 DIAGNOSIS — R0789 Other chest pain: Secondary | ICD-10-CM | POA: Diagnosis not present

## 2021-09-07 DIAGNOSIS — R079 Chest pain, unspecified: Secondary | ICD-10-CM | POA: Diagnosis not present

## 2021-09-07 DIAGNOSIS — Z20822 Contact with and (suspected) exposure to covid-19: Secondary | ICD-10-CM | POA: Diagnosis not present

## 2021-09-09 DIAGNOSIS — R079 Chest pain, unspecified: Secondary | ICD-10-CM | POA: Diagnosis not present

## 2021-09-14 ENCOUNTER — Encounter (HOSPITAL_BASED_OUTPATIENT_CLINIC_OR_DEPARTMENT_OTHER): Payer: Self-pay | Admitting: Urology

## 2021-09-14 ENCOUNTER — Emergency Department (HOSPITAL_BASED_OUTPATIENT_CLINIC_OR_DEPARTMENT_OTHER): Payer: BC Managed Care – PPO

## 2021-09-14 ENCOUNTER — Emergency Department (HOSPITAL_BASED_OUTPATIENT_CLINIC_OR_DEPARTMENT_OTHER)
Admission: EM | Admit: 2021-09-14 | Discharge: 2021-09-14 | Disposition: A | Discharge status: Home / Self Care | Payer: BC Managed Care – PPO | Attending: Emergency Medicine | Admitting: Emergency Medicine

## 2021-09-14 DIAGNOSIS — R110 Nausea: Secondary | ICD-10-CM | POA: Diagnosis not present

## 2021-09-14 DIAGNOSIS — R112 Nausea with vomiting, unspecified: Secondary | ICD-10-CM | POA: Insufficient documentation

## 2021-09-14 DIAGNOSIS — R197 Diarrhea, unspecified: Secondary | ICD-10-CM | POA: Diagnosis not present

## 2021-09-14 DIAGNOSIS — R109 Unspecified abdominal pain: Secondary | ICD-10-CM | POA: Diagnosis not present

## 2021-09-14 DIAGNOSIS — R1013 Epigastric pain: Secondary | ICD-10-CM | POA: Insufficient documentation

## 2021-09-14 DIAGNOSIS — R000 Tachycardia, unspecified: Secondary | ICD-10-CM | POA: Diagnosis not present

## 2021-09-14 DIAGNOSIS — N3289 Other specified disorders of bladder: Secondary | ICD-10-CM | POA: Diagnosis not present

## 2021-09-14 DIAGNOSIS — R1084 Generalized abdominal pain: Secondary | ICD-10-CM | POA: Diagnosis not present

## 2021-09-14 DIAGNOSIS — R1111 Vomiting without nausea: Secondary | ICD-10-CM | POA: Diagnosis not present

## 2021-09-14 LAB — BASIC METABOLIC PANEL
Anion gap: 6 (ref 5–15)
BUN: 10 mg/dL (ref 6–20)
CO2: 24 mmol/L (ref 22–32)
Calcium: 8.2 mg/dL — ABNORMAL LOW (ref 8.9–10.3)
Chloride: 108 mmol/L (ref 98–111)
Creatinine, Ser: 0.68 mg/dL (ref 0.44–1.00)
GFR, Estimated: >60 mL/min (ref >60)
Glucose, Bld: 119 mg/dL — ABNORMAL HIGH (ref 70–99)
Potassium: 3.3 mmol/L — ABNORMAL LOW (ref 3.5–5.1)
Sodium: 138 mmol/L (ref 135–145)

## 2021-09-14 LAB — CBC WITH DIFFERENTIAL/PLATELET
Abs Immature Granulocytes: 0.04 10*3/uL (ref 0.00–0.07)
Basophils Absolute: 0 10*3/uL (ref 0.0–0.1)
Basophils Relative: 0 %
Eosinophils Absolute: 0 10*3/uL (ref 0.0–0.5)
Eosinophils Relative: 0 %
HCT: 39.6 % (ref 36.0–46.0)
Hemoglobin: 12.8 g/dL (ref 12.0–15.0)
Immature Granulocytes: 0 %
Lymphocytes Relative: 10 %
Lymphs Abs: 1.4 10*3/uL (ref 0.7–4.0)
MCH: 27.8 pg (ref 26.0–34.0)
MCHC: 32.3 g/dL (ref 30.0–36.0)
MCV: 86.1 fL (ref 80.0–100.0)
Monocytes Absolute: 1.1 10*3/uL — ABNORMAL HIGH (ref 0.1–1.0)
Monocytes Relative: 7 %
Neutro Abs: 12.2 10*3/uL — ABNORMAL HIGH (ref 1.7–7.7)
Neutrophils Relative %: 83 %
Platelets: 273 10*3/uL (ref 150–400)
RBC: 4.6 MIL/uL (ref 3.87–5.11)
RDW: 14.1 % (ref 11.5–15.5)
WBC: 14.8 10*3/uL — ABNORMAL HIGH (ref 4.0–10.5)
nRBC: 0 % (ref 0.0–0.2)

## 2021-09-14 LAB — PREGNANCY, URINE: Preg Test, Ur: NEGATIVE

## 2021-09-14 MED ORDER — FAMOTIDINE 20 MG PO TABS: 20.0000 mg | ORAL_TABLET | Freq: Two times a day (BID) | ORAL | 0 refills | Status: DC

## 2021-09-14 MED ORDER — KETOROLAC TROMETHAMINE 30 MG/ML IJ SOLN
30.0000 mg | Freq: Once | INTRAMUSCULAR | Status: AC
  Administered 2021-09-14: 30 mg via INTRAVENOUS
  Filled 2021-09-14: qty 1

## 2021-09-14 MED ORDER — ALUM & MAG HYDROXIDE-SIMETH 200-200-20 MG/5ML PO SUSP
30.0000 mL | Freq: Once | ORAL | Status: AC
  Administered 2021-09-14: 30 mL via ORAL
  Filled 2021-09-14: qty 30

## 2021-09-14 MED ORDER — FAMOTIDINE IN NACL 20-0.9 MG/50ML-% IV SOLN
20.0000 mg | Freq: Once | INTRAVENOUS | Status: AC
  Administered 2021-09-14: 20 mg via INTRAVENOUS
  Filled 2021-09-14: qty 50

## 2021-09-14 MED ORDER — SODIUM CHLORIDE 0.9 % IV BOLUS
500.0000 mL | Freq: Once | INTRAVENOUS | Status: AC
  Administered 2021-09-14: 500 mL via INTRAVENOUS

## 2021-09-14 MED ORDER — ONDANSETRON 8 MG PO TBDP: ORAL_TABLET | ORAL | 0 refills | Status: DC

## 2021-09-14 MED ORDER — HALOPERIDOL LACTATE 5 MG/ML IJ SOLN
2.0000 mg | Freq: Once | INTRAMUSCULAR | Status: AC
  Administered 2021-09-14: 2 mg via INTRAVENOUS
  Filled 2021-09-14: qty 1

## 2021-09-14 NOTE — ED Provider Notes (Signed)
[facility] EMERGENCY DEPARTMENT Provider Note   CSN: [id number] Arrival date & time: 09/14/21  0053     History Chief Complaint  Patient presents with   Abdominal Pain    Stefanie Lucero is a 31 y.o. female.  The history is provided by the patient.  Abdominal Pain Pain location:  Epigastric Pain quality: squeezing   Pain radiates to:  Does not radiate Pain severity:  Moderate Onset quality:  Sudden Duration: hours. Timing:  Constant Progression:  Unchanged Chronicity:  New Context: sick contacts   Relieved by:  Nothing Worsened by:  Nothing Ineffective treatments:  None tried Associated symptoms: diarrhea and nausea   Associated symptoms: no anorexia, no dysuria, no fever, no shortness of breath and no vomiting   Diarrhea:    Quality:  Watery   Severity:  Moderate   Duration:  2 days   Timing:  Sporadic   Progression:  Unchanged Nausea:    Severity:  Severe   Onset quality:  Sudden   Nausea duration: hours.   Timing:  Constant   Progression:  Resolved Risk factors: no aspirin use and not elderly   Patient with GERD presents with diarrhea that started 2 days and then nausea and epigastric pain tonight.  Given zofran with resolution of nausea.     Home Medications Prior to Admission medications   Medication Sig Start Date End Date Taking? Authorizing Provider  famotidine (PEPCID) 20 MG tablet Take 1 tablet (20 mg total) by mouth 2 (two) times daily. 09/14/21  Yes Darryl Blumenstein, MD  ondansetron (ZOFRAN-ODT) 8 MG disintegrating tablet 8mg  ODT q8 hours prn nausea 09/14/21  Yes Denielle Bayard, MD  acetaminophen (TYLENOL) 500 MG tablet Take 1,000 mg by mouth every 6 (six) hours as needed for moderate pain or headache.    [provider]  albuterol (VENTOLIN HFA) 108 (90 Base) MCG/ACT inhaler Inhale 2 puffs into the lungs every 4 (four) hours as needed for wheezing or shortness of breath. 11/06/20   [provider]  diclofenac Sodium  (VOLTAREN) 1 % GEL Apply 2 g topically 4 (four) times daily as needed. Patient not taking: Reported on 07/07/2021 06/16/21   Maia PlanLong, Joshua G, MD  famotidine (PEPCID) 40 MG tablet Take 1 tablet (40 mg total) by mouth daily. Patient not taking: Reported on 07/07/2021 05/25/21 08/23/21  Theadora RamaMorgan, Lindsay Scales, PA-C  fluticasone (FLOVENT HFA) 220 MCG/ACT inhaler Spray medication into mouth then swallow.  Repeat for total of 2 sprays.  Do not inhale medication.  Do not eat or drink for 30 minutes after administering medication.  It is okay to continue swallowing saliva during this 30 minutes.  Perform this treatment twice daily until follow-up with gastroenterology. Patient not taking: Reported on 07/07/2021 05/25/21   Theadora RamaMorgan, Lindsay Scales, PA-C  ibuprofen (ADVIL) 200 MG tablet Take 800 mg by mouth every 6 (six) hours as needed for headache or moderate pain.    [provider]  lansoprazole (PREVACID) 30 MG capsule Take 1 capsule (30 mg total) by mouth 2 (two) times daily before a meal. 05/25/21 08/23/21  Theadora RamaMorgan, Lindsay Scales, PA-C  lidocaine (XYLOCAINE) 5 % ointment Apply 1 application topically 3 (three) times daily as needed for moderate pain. Patient not taking: Reported on 07/07/2021 06/16/21   Maia PlanLong, Joshua G, MD  meclizine (ANTIVERT) 25 MG tablet Take 25 mg by mouth 2 (two) times daily as needed for dizziness.    [provider]  ondansetron (ZOFRAN ODT) 4 MG disintegrating tablet Take  1 tablet (4 mg total) by mouth every 8 (eight) hours as needed for nausea or vomiting. 06/10/21   Rancour, Jeannett SeniorStephen, MD  oseltamivir (TAMIFLU) 75 MG capsule Take 1 capsule (75 mg total) by mouth every 12 (twelve) hours. 05/29/21   Smoot, Shawn RouteSarah A, PA-C  sertraline (ZOLOFT) 50 MG tablet Take 50 mg by mouth daily. 04/23/21   [provider]  sucralfate (CARAFATE) 1 GM/10ML suspension Take 10 mLs (1 g total) by mouth 4 (four) times daily -  with meals and at bedtime. 08/23/21   Mita Vallo, MD   SUMAtriptan (IMITREX) 50 MG tablet Take 50 mg by mouth daily as needed for migraine. 04/23/21   [provider]  etonogestrel-ethinyl estradiol (NUVARING) 0.12-0.015 MG/24HR vaginal ring Place 1 each vaginally every 28 (twenty-eight) days. Insert vaginally and leave in place for 3 consecutive weeks, then remove for 1 week. Patient not taking: No sig reported 10/02/20 11/12/20  Wyline BeadyWallace, Tiffany A, NP      Allergies    Eggs or egg-derived products, Nutmeg oil (myristica oil), Cefdinir, Ceftin [cefuroxime], Guggulipid-black pepper, Iodine, and Nickel    Review of Systems   Review of Systems  Constitutional:  Negative for fever.  HENT:  Negative for facial swelling.   Eyes:  Negative for redness.  Respiratory:  Negative for shortness of breath.   Cardiovascular:  Negative for leg swelling.  Gastrointestinal:  Positive for abdominal pain, diarrhea and nausea. Negative for anorexia and vomiting.  Genitourinary:  Negative for dysuria.  Musculoskeletal:  Negative for neck pain.  Skin:  Negative for rash.  Neurological:  Negative for facial asymmetry.  Psychiatric/Behavioral:  Negative for agitation.   All other systems reviewed and are negative.  Physical Exam Updated Vital Signs BP 112/75    Pulse (!) 112    Temp 97.8 F (36.6 C) (Oral)    Resp 18    Ht 5\' 3"  (1.6 m)    Wt 86.2 kg    LMP 09/14/2021 (Exact Date)    SpO2 97%    BMI 33.66 kg/m  Physical Exam Vitals and nursing note reviewed. Exam conducted with a chaperone present.  Constitutional:      General: She is not in acute distress.    Appearance: Normal appearance.  HENT:     Head: Normocephalic and atraumatic.     Nose: Nose normal.  Eyes:     Conjunctiva/sclera: Conjunctivae normal.     Pupils: Pupils are equal, round, and reactive to light.  Cardiovascular:     Rate and Rhythm: Normal rate and regular rhythm.     Pulses: Normal pulses.     Heart sounds: Normal heart sounds.  Pulmonary:     Effort: Pulmonary  effort is normal.     Breath sounds: Normal breath sounds.  Abdominal:     General: Bowel sounds are normal.     Tenderness: There is no abdominal tenderness. There is no guarding or rebound.     Hernia: No hernia is present.  Musculoskeletal:        General: Normal range of motion.     Cervical back: Normal range of motion and neck supple.  Skin:    General: Skin is warm and dry.     Capillary Refill: Capillary refill takes less than 2 seconds.  Neurological:     General: No focal deficit present.     Mental Status: She is alert and oriented to person, place, and time.     Deep Tendon Reflexes: Reflexes normal.  Psychiatric:        Mood and Affect: Mood normal.        Behavior: Behavior normal.    ED Results / Procedures / Treatments   Labs (all labs ordered are listed, but only abnormal results are displayed) Results for orders placed or performed during the hospital encounter of 09/14/21  CBC with Differential/Platelet  Result Value Ref Range   WBC 14.8 (H) 4.0 - 10.5 K/uL   RBC 4.60 3.87 - 5.11 MIL/uL   Hemoglobin 12.8 12.0 - 15.0 g/dL   HCT 82.5 05.3 - 97.6 %   MCV 86.1 80.0 - 100.0 fL   MCH 27.8 26.0 - 34.0 pg   MCHC 32.3 30.0 - 36.0 g/dL   RDW 73.4 19.3 - 79.0 %   Platelets 273 150 - 400 K/uL   nRBC 0.0 0.0 - 0.2 %   Neutrophils Relative % 83 %   Neutro Abs 12.2 (H) 1.7 - 7.7 K/uL   Lymphocytes Relative 10 %   Lymphs Abs 1.4 0.7 - 4.0 K/uL   Monocytes Relative 7 %   Monocytes Absolute 1.1 (H) 0.1 - 1.0 K/uL   Eosinophils Relative 0 %   Eosinophils Absolute 0.0 0.0 - 0.5 K/uL   Basophils Relative 0 %   Basophils Absolute 0.0 0.0 - 0.1 K/uL   Immature Granulocytes 0 %   Abs Immature Granulocytes 0.04 0.00 - 0.07 K/uL  Pregnancy, urine  Result Value Ref Range   Preg Test, Ur NEGATIVE NEGATIVE  Basic metabolic panel  Result Value Ref Range   Sodium 138 135 - 145 mmol/L   Potassium 3.3 (L) 3.5 - 5.1 mmol/L   Chloride 108 98 - 111 mmol/L   CO2 24 22 - 32  mmol/L   Glucose, Bld 119 (H) 70 - 99 mg/dL   BUN 10 6 - 20 mg/dL   Creatinine, Ser 2.40 0.44 - 1.00 mg/dL   Calcium 8.2 (L) 8.9 - 10.3 mg/dL   GFR, Estimated >97 >35 mL/min   Anion gap 6 5 - 15   DG Abdomen Acute W/Chest  Result Date: 08/23/2021 CLINICAL DATA:  Intermittent chest pressure and shortness of breath EXAM: DG ABDOMEN ACUTE WITH 1 VIEW CHEST COMPARISON:  Abdominal CT 07/01/2021 FINDINGS: There is no evidence of dilated bowel loops or free intraperitoneal air. No radiopaque calculi or other significant radiographic abnormality is seen. Moderate stool seen mainly over the right colon. Heart size and mediastinal contours are within normal limits. Both lungs are clear.Extensive artifact from EKG leads over the abdomen. IMPRESSION: Negative abdominal radiographs.  No acute cardiopulmonary disease. Electronically Signed   By: Tiburcio Pea M.D.   On: 08/23/2021 04:43   CT Renal Stone Study  Result Date: 09/14/2021 CLINICAL DATA:  Flank pain EXAM: CT ABDOMEN AND PELVIS WITHOUT CONTRAST TECHNIQUE: Multidetector CT imaging of the abdomen and pelvis was performed following the standard protocol without IV contrast. RADIATION DOSE REDUCTION: This exam was performed according to the departmental dose-optimization program which includes automated exposure control, adjustment of the mA and/or kV according to patient size and/or use of iterative reconstruction technique. COMPARISON:  07/01/2021 FINDINGS: Lower chest: No acute abnormality. Hepatobiliary: No focal liver abnormality is seen. No gallstones, gallbladder wall thickening, or biliary dilatation. Pancreas: Unremarkable. No pancreatic ductal dilatation or surrounding inflammatory changes. Spleen: Normal in size without focal abnormality. Adrenals/Urinary Tract: Adrenal glands are within normal limits. The kidneys show no renal calculi or obstructive changes. Arteries are within normal limits. The bladder is well distended.  Stomach/Bowel: Appendix  is within normal limits. No obstructive or inflammatory changes of the colon are seen. Small bowel and stomach are unremarkable. Vascular/Lymphatic: No significant vascular findings are present. No enlarged abdominal or pelvic lymph nodes. Reproductive: Uterus and bilateral adnexa are unremarkable. Other: No abdominal wall hernia or abnormality. No abdominopelvic ascites. Musculoskeletal: No acute or significant osseous findings. IMPRESSION: No acute abnormality noted to correspond with the patient's given clinical symptomatology. Electronically Signed   By: Alcide Clever M.D.   On: 09/14/2021 03:24    None  Radiology CT Renal Stone Study  Result Date: 09/14/2021 CLINICAL DATA:  Flank pain EXAM: CT ABDOMEN AND PELVIS WITHOUT CONTRAST TECHNIQUE: Multidetector CT imaging of the abdomen and pelvis was performed following the standard protocol without IV contrast. RADIATION DOSE REDUCTION: This exam was performed according to the departmental dose-optimization program which includes automated exposure control, adjustment of the mA and/or kV according to patient size and/or use of iterative reconstruction technique. COMPARISON:  07/01/2021 FINDINGS: Lower chest: No acute abnormality. Hepatobiliary: No focal liver abnormality is seen. No gallstones, gallbladder wall thickening, or biliary dilatation. Pancreas: Unremarkable. No pancreatic ductal dilatation or surrounding inflammatory changes. Spleen: Normal in size without focal abnormality. Adrenals/Urinary Tract: Adrenal glands are within normal limits. The kidneys show no renal calculi or obstructive changes. Arteries are within normal limits. The bladder is well distended. Stomach/Bowel: Appendix is within normal limits. No obstructive or inflammatory changes of the colon are seen. Small bowel and stomach are unremarkable. Vascular/Lymphatic: No significant vascular findings are present. No enlarged abdominal or pelvic lymph nodes. Reproductive: Uterus and  bilateral adnexa are unremarkable. Other: No abdominal wall hernia or abnormality. No abdominopelvic ascites. Musculoskeletal: No acute or significant osseous findings. IMPRESSION: No acute abnormality noted to correspond with the patient's given clinical symptomatology. Electronically Signed   By: Alcide Clever M.D.   On: 09/14/2021 03:24    Procedures Procedures    Medications Ordered in ED Medications  sodium chloride 0.9 % bolus 500 mL (has no administration in time range)  famotidine (PEPCID) IVPB 20 mg premix (0 mg Intravenous Stopped 09/14/21 0234)  alum & mag hydroxide-simeth (MAALOX/MYLANTA) 200-200-20 MG/5ML suspension 30 mL (30 mLs Oral Given 09/14/21 0147)  sodium chloride 0.9 % bolus 500 mL (0 mLs Intravenous Stopped 09/14/21 0235)  ketorolac (TORADOL) 30 MG/ML injection 30 mg (30 mg Intravenous Given 09/14/21 0306)  haloperidol lactate (HALDOL) injection 2 mg (2 mg Intravenous Given 09/14/21 0325)    ED Course/ Medical Decision Making/ A&P                           Medical Decision Making Patient with 2 days of diarrhea and now nausea and abdominal pain   Amount and/or Complexity of Data Reviewed External Data Reviewed: notes.    Details: multiple ED visits from [facility] Labs: ordered.    Details: reviewed by me: elevated white count at 14.8.  Normal BUN and creatinine, negative pregnancy test Radiology: ordered.    Details: CT non contrast: reviewed by me and is normal by my read  Risk OTC drugs. Prescription drug management. Risk Details: Patient with diarrhea and nausea, sick kids at home.  This is likely viral.  Well appearing.  Acute lifethreatening and surgical causes of pain have been excluded on imaging and exam.  Patient is stable for discharge with close follow up    Final Clinical Impression(s) / ED Diagnoses Final diagnoses:  Nausea vomiting and diarrhea  None    Return for intractable cough, coughing up blood, fevers > 100.4 unrelieved by  medication, shortness of breath, intractable vomiting, chest pain, shortness of breath, weakness, numbness, changes in speech, facial asymmetry, abdominal pain, passing out, Inability to tolerate liquids or food, cough, altered mental status or any concerns. No signs of systemic illness or infection. The patient is nontoxic-appearing on exam and vital signs are within normal limits.  I have reviewed the triage vital signs and the nursing notes. Pertinent labs & imaging results that were available during my care of the patient were reviewed by me and considered in my medical decision making (see chart for details). After history, exam, and medical workup I feel the patient has been appropriately medically screened and is safe for discharge home. Pertinent diagnoses were discussed with the patient. Patient was given return precautions.  Rx / DC Orders ED Discharge Orders          Ordered    ondansetron (ZOFRAN-ODT) 8 MG disintegrating tablet        09/14/21 0141    famotidine (PEPCID) 20 MG tablet  2 times daily        09/14/21 0412              Jeyli Zwicker, MD 09/14/21 0254

## 2021-09-14 NOTE — ED Triage Notes (Signed)
Abdominal pain that started tonight at 2300, states nausea and diarrhea x 2 days  Brought by EMS, VS wnl 4 mg zofran given in route, 20 G IV est in LAC 300 ml NS given in route

## 2021-09-17 ENCOUNTER — Ambulatory Visit: Payer: BC Managed Care – PPO | Admitting: Nurse Practitioner

## 2021-09-18 DIAGNOSIS — E785 Hyperlipidemia, unspecified: Secondary | ICD-10-CM | POA: Diagnosis not present

## 2021-09-18 DIAGNOSIS — Z0000 Encounter for general adult medical examination without abnormal findings: Secondary | ICD-10-CM | POA: Diagnosis not present

## 2021-09-19 ENCOUNTER — Ambulatory Visit: Payer: BC Managed Care – PPO

## 2021-09-22 ENCOUNTER — Ambulatory Visit (INDEPENDENT_AMBULATORY_CARE_PROVIDER_SITE_OTHER): Payer: BC Managed Care – PPO | Admitting: Neurology

## 2021-09-22 ENCOUNTER — Other Ambulatory Visit: Payer: Self-pay

## 2021-09-22 DIAGNOSIS — R55 Syncope and collapse: Secondary | ICD-10-CM

## 2021-09-22 NOTE — Progress Notes (Signed)
Patient advised of her EEG results.  

## 2021-09-22 NOTE — Procedures (Signed)
ELECTROENCEPHALOGRAM REPORT  Date of Study: 09/22/2021  Patient's Name: Stefanie Lucero MRN: [id number] Date of Birth: 06/26/1991  Clinical History: 31 year old female with history of migraines presents with episodes of dizziness and near syncope  Medications: TYLENOL 500 MG tablet VENTOLIN HFA 108 (90 Base) MCG/ACT inhaler VOLTAREN 1 % GEL PEPCID 40 MG tablet FLOVENT HFA 220 MCG/ACT inhaler ADVIL 200 MG tablet PREVACID 30 MG capsule XYLOCAINE 5 % ointment ANTIVERT 25 MG tablet ZOFRAN ODT 4 MG disintegrating tablet TAMIFLU 75 MG capsule ZOLOFT 50 MG tablet IMITREX 50 MG tablet  Technical Summary: A multichannel digital EEG recording measured by the international 10-20 system with electrodes applied with paste and impedances below 5000 ohms performed in our laboratory with EKG monitoring in an awake patient.  Photic stimulation was performed.  The digital EEG was referentially recorded, reformatted, and digitally filtered in a variety of bipolar and referential montages for optimal display.    Description: The patient is awake during the recording.  During maximal wakefulness, there is a symmetric, medium voltage 11 Hz posterior dominant rhythm that attenuates with eye opening.  The record is symmetric.  Stage 2 sleep was not seen.  Photic stimulation did not elicit any abnormalities.  There were no epileptiform discharges or electrographic seizures seen.    EKG lead was unremarkable.  Impression: This awake  EEG is normal.    Clinical Correlation: A normal EEG does not exclude a clinical diagnosis of epilepsy.  If further clinical questions remain, prolonged EEG may be helpful.  Clinical correlation is advised.   Shon Millet, DO

## 2021-09-24 ENCOUNTER — Ambulatory Visit: Payer: BC Managed Care – PPO | Admitting: Nurse Practitioner

## 2021-09-24 ENCOUNTER — Ambulatory Visit: Payer: Self-pay

## 2021-09-29 ENCOUNTER — Encounter (HOSPITAL_COMMUNITY): Payer: Self-pay | Admitting: Gastroenterology

## 2021-09-29 NOTE — Progress Notes (Signed)
Attempted to obtain medical history via telephone, unable to reach at this time. I left a voicemail to return pre surgical testing department's phone call.  

## 2021-10-01 ENCOUNTER — Other Ambulatory Visit (HOSPITAL_COMMUNITY)
Admission: RE | Admit: 2021-10-01 | Discharge: 2021-10-01 | Disposition: A | Discharge status: Home / Self Care | Payer: BC Managed Care – PPO | Source: Ambulatory Visit | Attending: Nurse Practitioner | Admitting: Nurse Practitioner

## 2021-10-01 ENCOUNTER — Ambulatory Visit (INDEPENDENT_AMBULATORY_CARE_PROVIDER_SITE_OTHER): Payer: BC Managed Care – PPO | Admitting: Nurse Practitioner

## 2021-10-01 ENCOUNTER — Encounter: Payer: Self-pay | Admitting: Nurse Practitioner

## 2021-10-01 ENCOUNTER — Other Ambulatory Visit: Payer: Self-pay

## 2021-10-01 VITALS — BP 118/78 | Ht 64.0 in | Wt 191.0 lb

## 2021-10-01 DIAGNOSIS — Z30016 Encounter for initial prescription of transdermal patch hormonal contraceptive device: Secondary | ICD-10-CM | POA: Diagnosis not present

## 2021-10-01 DIAGNOSIS — Z01419 Encounter for gynecological examination (general) (routine) without abnormal findings: Secondary | ICD-10-CM

## 2021-10-01 DIAGNOSIS — N898 Other specified noninflammatory disorders of vagina: Secondary | ICD-10-CM

## 2021-10-01 DIAGNOSIS — N920 Excessive and frequent menstruation with regular cycle: Secondary | ICD-10-CM | POA: Diagnosis not present

## 2021-10-01 LAB — WET PREP FOR TRICH, YEAST, CLUE

## 2021-10-01 MED ORDER — NORELGESTROMIN-ETH ESTRADIOL 150-35 MCG/24HR TD PTWK: 1.0000 | MEDICATED_PATCH | TRANSDERMAL | 3 refills | Status: DC

## 2021-10-01 NOTE — Progress Notes (Signed)
? ?Stefanie Lucero 10/24/1990 [id number] ? ? ?History:  31 y.o. [id number] presents for annual exam. Monthly cycles. Menses have become heavier since IUD removal. Heavy bleeding x 5 days, then tapers off for a total of 7 days. She liked the vaginal ring but would forget to change it. Complains of "slimy clear discharge" without itching or odor. She thinks it occurs at the same time each month and she is experiencing it today. Normal pap history. Migraines managed by neurology. Occur rarely now since changes diet, no aura and more tension-type. GERD managed by PCP, has esophageal manometry next week.  ? ?Gynecologic History ?Patient's last menstrual period was 09/14/2021 (exact date). ?Period Cycle (Days): 28 ?Period Duration (Days): 7 ?Period Pattern: Regular ?Menstrual Flow: Heavy ?Dysmenorrhea: (!) Severe ?Dysmenorrhea Symptoms: Cramping ?Contraception/Family planning: none ?Sexually active: Yes ? ?Health Maintenance ?Last Pap: 08/01/2018. Results were: Normal ?Last mammogram: Not indicated ?Last colonoscopy: Not indicated ?Last Dexa: Not indicated  ? ?Past medical history, past surgical history, family history and social history were all reviewed and documented in the EPIC chart. Engaged. 6 yo son and 63 yo daughter.  ? ?ROS:  A ROS was performed and pertinent positives and negatives are included. ? ?Exam: ? ?Vitals:  ? 10/01/21 1120  ?BP: 118/78  ?Weight: 191 lb (86.6 kg)  ?Height: 5\' 4"  (1.626 m)  ? ?Body mass index is 32.79 kg/m?. ? ?General appearance:  Normal ?Thyroid:  Symmetrical, normal in size, without palpable masses or nodularity. ?Respiratory ? Auscultation:  Clear without wheezing or rhonchi ?Cardiovascular ? Auscultation:  Regular rate, without rubs, murmurs or gallops ? Edema/varicosities:  Not grossly evident ?Abdominal ? Soft,nontender, without masses, guarding or rebound. ? Liver/spleen:  No organomegaly noted ? Hernia:  None appreciated ? Skin ? Inspection:  Grossly normal ?Breasts: Examined lying and  sitting.  ? Right: Without masses, retractions, nipple discharge or axillary adenopathy. ? ? Left: Without masses, retractions, nipple discharge or axillary adenopathy. ?Genitourinary  ? Inguinal/mons:  Normal without inguinal adenopathy ? External genitalia:  Normal appearing vulva with no masses, tenderness, or lesions ? BUS/Urethra/Skene's glands:  Normal ? Vagina:  Normal appearing with normal color and discharge, no lesions ? Cervix:  Normal appearing without discharge or lesions ? Uterus:  Normal in size, shape and contour.  Midline and mobile, nontender ? Adnexa/parametria:   ?  Rt: Normal in size, without masses or tenderness. ?  Lt: Normal in size, without masses or tenderness. ? Anus and perineum: Normal ? Digital rectal exam: Not indicated ? ?Patient informed chaperone available to be present for breast and pelvic exam. Patient has requested no chaperone to be present. Patient has been advised what will be completed during breast and pelvic exam.  ? ?Wet prep negative ? ?Assessment/Plan:  31 y.o. [id number] for annual exam.  ? ?Well female exam with routine gynecological exam - Plan: Cytology - PAP( [place]). Education provided on SBEs, importance of preventative screenings, current guidelines, high calcium diet, regular exercise, and multivitamin daily.  Labs with PCP.  ? ?Vaginal discharge - Plan: [place], CLUE. Negative wet prep. Reassured that changes in cervical mucous throughout cycle is normal.  ? ?Menorrhagia with regular cycle - Plan: norelgestromin-ethinyl estradiol Marilu Favre) 150-35 MCG/24HR transdermal patch. Menses have become heavier since IUD removal. She experiences 5 days of heavy bleeding then tapering x 2 days with total bleeding time of 7 days. She has bad cramps, cannot take NSAIDs due to GERD. Tylenol doesn't help much. She would like  to try patch.  ? ?Encounter for initial prescription of transdermal patch hormonal contraceptive device - Plan: norelgestromin-ethinyl  estradiol Marilu Favre) 150-35 MCG/24HR transdermal patch. Discussed options and she would like to try patch. She is aware of proper use and risks. ? ?Screening for cervical cancer - Normal Pap history. Pap today.  ? ?Return in 1 year for annual.  ? ? ? ?Tamela Gammon DNP, 11:55 AM 10/01/2021 ? ?

## 2021-10-02 ENCOUNTER — Ambulatory Visit: Payer: BC Managed Care – PPO

## 2021-10-02 DIAGNOSIS — K529 Noninfective gastroenteritis and colitis, unspecified: Secondary | ICD-10-CM | POA: Diagnosis not present

## 2021-10-02 LAB — CYTOLOGY - PAP
Chlamydia: NEGATIVE
Comment: NEGATIVE
Comment: NORMAL
Diagnosis: NEGATIVE
Neisseria Gonorrhea: NEGATIVE

## 2021-10-04 DIAGNOSIS — E860 Dehydration: Secondary | ICD-10-CM | POA: Diagnosis not present

## 2021-10-04 DIAGNOSIS — Z3202 Encounter for pregnancy test, result negative: Secondary | ICD-10-CM | POA: Diagnosis not present

## 2021-10-04 DIAGNOSIS — R1013 Epigastric pain: Secondary | ICD-10-CM | POA: Diagnosis not present

## 2021-10-04 DIAGNOSIS — R112 Nausea with vomiting, unspecified: Secondary | ICD-10-CM | POA: Diagnosis not present

## 2021-10-04 DIAGNOSIS — R197 Diarrhea, unspecified: Secondary | ICD-10-CM | POA: Diagnosis not present

## 2021-10-05 DIAGNOSIS — R000 Tachycardia, unspecified: Secondary | ICD-10-CM | POA: Diagnosis not present

## 2021-10-05 DIAGNOSIS — R079 Chest pain, unspecified: Secondary | ICD-10-CM | POA: Diagnosis not present

## 2021-10-07 ENCOUNTER — Encounter: Payer: Self-pay | Admitting: Nurse Practitioner

## 2021-10-07 ENCOUNTER — Encounter (HOSPITAL_COMMUNITY)
Admission: RE | Disposition: A | Discharge status: Home / Self Care | Payer: Self-pay | Source: Home / Self Care | Attending: Gastroenterology

## 2021-10-07 ENCOUNTER — Ambulatory Visit (HOSPITAL_COMMUNITY)
Admission: RE | Admit: 2021-10-07 | Discharge: 2021-10-07 | Disposition: A | Discharge status: Home / Self Care | Payer: BC Managed Care – PPO | Attending: Gastroenterology | Admitting: Gastroenterology

## 2021-10-07 DIAGNOSIS — K219 Gastro-esophageal reflux disease without esophagitis: Secondary | ICD-10-CM | POA: Insufficient documentation

## 2021-10-07 DIAGNOSIS — R079 Chest pain, unspecified: Secondary | ICD-10-CM | POA: Diagnosis not present

## 2021-10-07 DIAGNOSIS — R1110 Vomiting, unspecified: Secondary | ICD-10-CM | POA: Insufficient documentation

## 2021-10-07 SURGERY — MANOMETRY, ESOPHAGUS

## 2021-10-07 MED ORDER — LIDOCAINE VISCOUS HCL 2 % MT SOLN
OROMUCOSAL | Status: AC
  Filled 2021-10-07: qty 15

## 2021-10-07 SURGICAL SUPPLY — 2 items
FACESHIELD LNG OPTICON STERILE (SAFETY) IMPLANT
GLOVE BIO SURGEON STRL SZ8 (GLOVE) ×6 IMPLANT

## 2021-10-07 NOTE — Progress Notes (Signed)
Esophageal Manometry done per protocol. Pt tolerated well with out complication. Ph with impedance done per protocol. Pt tolerated well. Instructions given regarding the study and monitor. Pt verbalized understand and return demonstrated use of monitor. Pt will return tomorrow to have probe removed and monitor downloaded.  

## 2021-10-08 ENCOUNTER — Encounter (HOSPITAL_COMMUNITY): Payer: Self-pay | Admitting: Gastroenterology

## 2021-10-09 ENCOUNTER — Ambulatory Visit
Admission: RE | Admit: 2021-10-09 | Discharge: 2021-10-09 | Disposition: A | Discharge status: Home / Self Care | Payer: BC Managed Care – PPO | Source: Ambulatory Visit | Attending: Internal Medicine | Admitting: Internal Medicine

## 2021-10-10 DIAGNOSIS — Z881 Allergy status to other antibiotic agents status: Secondary | ICD-10-CM | POA: Diagnosis not present

## 2021-10-10 DIAGNOSIS — S29011A Strain of muscle and tendon of front wall of thorax, initial encounter: Secondary | ICD-10-CM | POA: Diagnosis not present

## 2021-10-10 DIAGNOSIS — M940 Chondrocostal junction syndrome [Tietze]: Secondary | ICD-10-CM | POA: Diagnosis not present

## 2021-10-10 DIAGNOSIS — R0789 Other chest pain: Secondary | ICD-10-CM | POA: Diagnosis not present

## 2021-10-10 DIAGNOSIS — M791 Myalgia, unspecified site: Secondary | ICD-10-CM | POA: Diagnosis not present

## 2021-10-12 DIAGNOSIS — K200 Eosinophilic esophagitis: Secondary | ICD-10-CM | POA: Diagnosis not present

## 2021-10-12 DIAGNOSIS — K219 Gastro-esophageal reflux disease without esophagitis: Secondary | ICD-10-CM | POA: Diagnosis not present

## 2021-10-12 DIAGNOSIS — R079 Chest pain, unspecified: Secondary | ICD-10-CM | POA: Diagnosis not present

## 2021-10-12 DIAGNOSIS — B3731 Acute candidiasis of vulva and vagina: Secondary | ICD-10-CM | POA: Diagnosis not present

## 2021-10-14 DIAGNOSIS — Z1329 Encounter for screening for other suspected endocrine disorder: Secondary | ICD-10-CM | POA: Diagnosis not present

## 2021-10-14 DIAGNOSIS — G8929 Other chronic pain: Secondary | ICD-10-CM | POA: Diagnosis not present

## 2021-10-17 DIAGNOSIS — R072 Precordial pain: Secondary | ICD-10-CM | POA: Diagnosis not present

## 2021-10-18 ENCOUNTER — Emergency Department (HOSPITAL_BASED_OUTPATIENT_CLINIC_OR_DEPARTMENT_OTHER): Payer: BC Managed Care – PPO

## 2021-10-18 ENCOUNTER — Other Ambulatory Visit: Payer: Self-pay

## 2021-10-18 ENCOUNTER — Emergency Department (HOSPITAL_BASED_OUTPATIENT_CLINIC_OR_DEPARTMENT_OTHER)
Admission: EM | Admit: 2021-10-18 | Discharge: 2021-10-18 | Disposition: A | Discharge status: Home / Self Care | Payer: BC Managed Care – PPO | Attending: Emergency Medicine | Admitting: Emergency Medicine

## 2021-10-18 ENCOUNTER — Encounter (HOSPITAL_BASED_OUTPATIENT_CLINIC_OR_DEPARTMENT_OTHER): Payer: Self-pay

## 2021-10-18 DIAGNOSIS — R1030 Lower abdominal pain, unspecified: Secondary | ICD-10-CM | POA: Insufficient documentation

## 2021-10-18 DIAGNOSIS — R110 Nausea: Secondary | ICD-10-CM | POA: Insufficient documentation

## 2021-10-18 DIAGNOSIS — R000 Tachycardia, unspecified: Secondary | ICD-10-CM | POA: Diagnosis not present

## 2021-10-18 DIAGNOSIS — R202 Paresthesia of skin: Secondary | ICD-10-CM | POA: Diagnosis not present

## 2021-10-18 DIAGNOSIS — R55 Syncope and collapse: Secondary | ICD-10-CM | POA: Insufficient documentation

## 2021-10-18 DIAGNOSIS — J45909 Unspecified asthma, uncomplicated: Secondary | ICD-10-CM | POA: Insufficient documentation

## 2021-10-18 DIAGNOSIS — R072 Precordial pain: Secondary | ICD-10-CM | POA: Diagnosis not present

## 2021-10-18 DIAGNOSIS — R079 Chest pain, unspecified: Secondary | ICD-10-CM

## 2021-10-18 DIAGNOSIS — R42 Dizziness and giddiness: Secondary | ICD-10-CM | POA: Diagnosis not present

## 2021-10-18 DIAGNOSIS — R0789 Other chest pain: Secondary | ICD-10-CM | POA: Diagnosis not present

## 2021-10-18 LAB — CBC
HCT: 40.4 % (ref 36.0–46.0)
Hemoglobin: 13.3 g/dL (ref 12.0–15.0)
MCH: 27.7 pg (ref 26.0–34.0)
MCHC: 32.9 g/dL (ref 30.0–36.0)
MCV: 84.2 fL (ref 80.0–100.0)
Platelets: 322 10*3/uL (ref 150–400)
RBC: 4.8 MIL/uL (ref 3.87–5.11)
RDW: 13.4 % (ref 11.5–15.5)
WBC: 8.5 10*3/uL (ref 4.0–10.5)
nRBC: 0 % (ref 0.0–0.2)

## 2021-10-18 LAB — BASIC METABOLIC PANEL
Anion gap: 7 (ref 5–15)
BUN: 9 mg/dL (ref 6–20)
CO2: 24 mmol/L (ref 22–32)
Calcium: 9.2 mg/dL (ref 8.9–10.3)
Chloride: 107 mmol/L (ref 98–111)
Creatinine, Ser: 0.89 mg/dL (ref 0.44–1.00)
GFR, Estimated: >60 mL/min (ref >60)
Glucose, Bld: 102 mg/dL — ABNORMAL HIGH (ref 70–99)
Potassium: 3.7 mmol/L (ref 3.5–5.1)
Sodium: 138 mmol/L (ref 135–145)

## 2021-10-18 LAB — TROPONIN I (HIGH SENSITIVITY): Troponin I (High Sensitivity): <2 ng/L (ref <18)

## 2021-10-18 LAB — PREGNANCY, URINE: Preg Test, Ur: NEGATIVE

## 2021-10-18 MED ORDER — ONDANSETRON HCL 4 MG/2ML IJ SOLN
4.0000 mg | Freq: Once | INTRAMUSCULAR | Status: AC
  Administered 2021-10-18: 4 mg via INTRAVENOUS
  Filled 2021-10-18: qty 2

## 2021-10-18 MED ORDER — SODIUM CHLORIDE 0.9 % IV BOLUS
1000.0000 mL | Freq: Once | INTRAVENOUS | Status: AC
  Administered 2021-10-18: 1000 mL via INTRAVENOUS

## 2021-10-18 NOTE — ED Triage Notes (Signed)
Pt c/o chest pain across chest, shortness of breath, lightheaded. States syncopal episode when driving today. Anxious in triage, crying. States hx of tachycardia, HR was 170 pta. ?

## 2021-10-18 NOTE — ED Provider Notes (Signed)
?[facility] EMERGENCY DEPARTMENT ?Provider Note ? ? ?CSN: [id number] ?Arrival date & time: 10/18/21  1326 ? ?  ? ?History ?Chief Complaint  ?Patient presents with  ? Chest Pain  ? ? ?Stefanie Lucero is a 31 y.o. female with history of costochondritis, anxiety, eosinophilic esophagitis who presents to the emergency department with substernal chest tightness and a brief episode of syncope that lasted approximately 12 seconds.  This happened just prior to arrival.  Patient woke up with substernal chest tightness which she attributed to her costochondritis is been bothering her for the last 6 months.  Patient was in the car talking to her spouse when she got lightheaded and felt like she was going to pass out. She approximately lost consciousness for 12 seconds. She does endorse some episodes in the past but nothing as severe as today.  She came to she states she had some intermittent left arm tingling.  ? ?Currently, patient still having some chest pain but her tingliness has since resolved.  She does report associated nausea without any vomiting.  Has not been sick recently with any cough, congestion, sore throat, diarrhea, fever, chills. ? ? ?Chest Pain ? ?HPI: A 31 year old patient with a history of obesity presents for evaluation of chest pain. Initial onset of pain was more than 6 hours ago. The patient's chest pain is well-localized, is sharp and is not worse with exertion. The patient complains of nausea. The patient's chest pain is middle- or left-sided, is not described as heaviness/pressure/tightness and does not radiate to the arms/jaw/neck. The patient denies diaphoresis. The patient has no history of stroke, has no history of peripheral artery disease, has not smoked in the past 90 days, denies any history of treated diabetes, has no relevant family history of coronary artery disease (first degree relative at less than age 25), is not hypertensive and has no history of hypercholesterolemia.   ? ?Home Medications ?Prior to Admission medications   ?Medication Sig Start Date End Date Taking? Authorizing Provider  ?acetaminophen (TYLENOL) 500 MG tablet Take 1,000 mg by mouth every 6 (six) hours as needed for moderate pain or headache.    [provider]  ?albuterol (VENTOLIN HFA) 108 (90 Base) MCG/ACT inhaler Inhale 2 puffs into the lungs every 4 (four) hours as needed for wheezing or shortness of breath. 11/06/20   [provider]  ?BUDESONIDE PO Take by mouth.    [provider]  ?diclofenac Sodium (VOLTAREN) 1 % GEL Apply 2 g topically 4 (four) times daily as needed. 06/16/21   Long, Arlyss Repress, MD  ?famotidine (PEPCID) 20 MG tablet Take 1 tablet (20 mg total) by mouth 2 (two) times daily. 09/14/21   Palumbo, April, MD  ?famotidine (PEPCID) 40 MG tablet Take 1 tablet (40 mg total) by mouth daily. ?Patient not taking: Reported on 07/07/2021 05/25/21 08/23/21  Theadora Rama Scales, PA-C  ?fluticasone (FLOVENT HFA) 220 MCG/ACT inhaler Spray medication into mouth then swallow.  Repeat for total of 2 sprays.  Do not inhale medication.  Do not eat or drink for 30 minutes after administering medication.  It is okay to continue swallowing saliva during this 30 minutes.  Perform this treatment twice daily until follow-up with gastroenterology. ?Patient not taking: Reported on 10/01/2021 05/25/21   Theadora Rama Scales, PA-C  ?ibuprofen (ADVIL) 200 MG tablet Take 800 mg by mouth every 6 (six) hours as needed for headache or moderate pain.    [provider]  ?lansoprazole (PREVACID) 30 MG capsule  Take 1 capsule (30 mg total) by mouth 2 (two) times daily before a meal. 05/25/21 08/23/21  Theadora RamaMorgan, Lindsay Scales, PA-C  ?lidocaine (XYLOCAINE) 5 % ointment Apply 1 application topically 3 (three) times daily as needed for moderate pain. 06/16/21   Long, Arlyss RepressJoshua G, MD  ?meclizine (ANTIVERT) 25 MG tablet Take 25 mg by mouth 2 (two) times daily as needed for dizziness. ?Patient not taking:  Reported on 10/01/2021    [provider]  ?norelgestromin-ethinyl estradiol Burr Medico(XULANE) 150-35 MCG/24HR transdermal patch Place 1 patch onto the skin once a week. 10/01/21   Olivia MackieWallace, Tiffany A, NP  ?ondansetron (ZOFRAN ODT) 4 MG disintegrating tablet Take 1 tablet (4 mg total) by mouth every 8 (eight) hours as needed for nausea or vomiting. 06/10/21   Rancour, Jeannett SeniorStephen, MD  ?ondansetron (ZOFRAN-ODT) 8 MG disintegrating tablet 8mg  ODT q8 hours prn nausea 09/14/21   Palumbo, April, MD  ?sertraline (ZOLOFT) 50 MG tablet Take 50 mg by mouth daily. ?Patient not taking: Reported on 10/01/2021 04/23/21   [provider]  ?sucralfate (CARAFATE) 1 GM/10ML suspension Take 10 mLs (1 g total) by mouth 4 (four) times daily -  with meals and at bedtime. ?Patient not taking: Reported on 10/01/2021 08/23/21   Palumbo, April, MD  ?SUMAtriptan (IMITREX) 50 MG tablet Take 50 mg by mouth daily as needed for migraine. 04/23/21   [provider]  ?etonogestrel-ethinyl estradiol (NUVARING) 0.12-0.015 MG/24HR vaginal ring Place 1 each vaginally every 28 (twenty-eight) days. Insert vaginally and leave in place for 3 consecutive weeks, then remove for 1 week. ?Patient not taking: No sig reported 10/02/20 11/12/20  Olivia MackieWallace, Tiffany A, NP  ?   ? ?Allergies    ?Eggs or egg-derived products, Nutmeg oil (myristica oil), Cefdinir, Ceftin [cefuroxime], Guggulipid-black pepper, Iodine, and Nickel   ? ?Review of Systems   ?Review of Systems  ?Cardiovascular:  Positive for chest pain.  ?All other systems reviewed and are negative. ? ?Physical Exam ?Updated Vital Signs ?BP 108/80   Pulse 94   Temp 98 ?F (36.7 ?C) (Oral)   Resp (!) 21   Ht 5\' 3"  (1.6 m)   Wt 86.2 kg   LMP 09/14/2021 (Exact Date)   SpO2 100%   BMI 33.66 kg/m?  ?Physical Exam ?Vitals and nursing note reviewed.  ?Constitutional:   ?   General: She is not in acute distress. ?   Appearance: Normal appearance.  ?HENT:  ?   Head: Normocephalic and atraumatic.  ?    Mouth/Throat:  ?   Mouth: Mucous membranes are dry.  ?Eyes:  ?   General:     ?   Right eye: No discharge.     ?   Left eye: No discharge.  ?Cardiovascular:  ?   Comments: Regular rate and rhythm.  S1/S2 are distinct without any evidence of murmur, rubs, or gallops.  Radial pulses are 2+ bilaterally.  Dorsalis pedis pulses are 2+ bilaterally.  No evidence of pedal edema. ?Pulmonary:  ?   Comments: Clear to auscultation bilaterally.  Normal effort.  No respiratory distress.  No evidence of wheezes, rales, or rhonchi heard throughout. ?Chest:  ?   Comments: Point tenderness along the sternum which reproduces her chest pain. ?Abdominal:  ?   General: Abdomen is flat. Bowel sounds are normal. There is no distension.  ?   Tenderness: There is no guarding or rebound.  ?   Comments: Mild suprapubic tenderness  ?Musculoskeletal:     ?   General: Normal range  of motion.  ?   Cervical back: Neck supple.  ?Skin: ?   General: Skin is warm and dry.  ?   Findings: No rash.  ?Neurological:  ?   General: No focal deficit present.  ?   Mental Status: She is alert.  ?   Comments: Cranial nerves II to XII are intact.  5/5 strength to the upper and lower extremities.  Normal sensation to the upper and lower extremities.  No pronator drift.  Normal finger-nose without any evidence of dysmetria.  Normal heel-to-shin.  Extraocular movements are intact without any signs of nystagmus.  ?Psychiatric:     ?   Mood and Affect: Mood normal.     ?   Behavior: Behavior normal.  ? ? ?ED Results / Procedures / Treatments   ?Labs ?(all labs ordered are listed, but only abnormal results are displayed) ?Labs Reviewed  ?BASIC METABOLIC PANEL - Abnormal; Notable for the following components:  ?    Result Value  ? Glucose, Bld 102 (*)   ? All other components within normal limits  ?CBC  ?PREGNANCY, URINE  ?TROPONIN I (HIGH SENSITIVITY)  ? ? ?EKG ?EKG Interpretation ? ?Date/Time:  Sunday October 18 2021 13:34:48 EDT ?Ventricular Rate:  113 ?PR  Interval:  152 ?QRS Duration: 74 ?QT Interval:  450 ?QTC Calculation: 617 ?R Axis:   45 ?Text Interpretation: Sinus tachycardia Septal infarct , age undetermined T wave abnormality, consider inferior ischemia ? Prolonged QT Abnormal ECG Whe

## 2021-10-18 NOTE — Discharge Instructions (Signed)
Follow-up with your primary care doctor.  Come back here if you have any chest pain, episodes of passing out or other new concerning symptom. ?

## 2021-10-28 DIAGNOSIS — M7918 Myalgia, other site: Secondary | ICD-10-CM | POA: Diagnosis not present

## 2021-10-28 DIAGNOSIS — K200 Eosinophilic esophagitis: Secondary | ICD-10-CM | POA: Diagnosis not present

## 2021-10-28 DIAGNOSIS — R0789 Other chest pain: Secondary | ICD-10-CM | POA: Diagnosis not present

## 2021-10-31 DIAGNOSIS — R1084 Generalized abdominal pain: Secondary | ICD-10-CM | POA: Diagnosis not present

## 2021-10-31 DIAGNOSIS — R109 Unspecified abdominal pain: Secondary | ICD-10-CM | POA: Diagnosis not present

## 2021-11-03 DIAGNOSIS — R079 Chest pain, unspecified: Secondary | ICD-10-CM | POA: Diagnosis not present

## 2021-11-03 DIAGNOSIS — R9431 Abnormal electrocardiogram [ECG] [EKG]: Secondary | ICD-10-CM | POA: Diagnosis not present

## 2021-11-03 DIAGNOSIS — R0602 Shortness of breath: Secondary | ICD-10-CM | POA: Diagnosis not present

## 2021-11-03 DIAGNOSIS — M549 Dorsalgia, unspecified: Secondary | ICD-10-CM | POA: Diagnosis not present

## 2021-11-03 DIAGNOSIS — M25512 Pain in left shoulder: Secondary | ICD-10-CM | POA: Diagnosis not present

## 2021-11-03 DIAGNOSIS — Z79899 Other long term (current) drug therapy: Secondary | ICD-10-CM | POA: Diagnosis not present

## 2021-11-03 DIAGNOSIS — R42 Dizziness and giddiness: Secondary | ICD-10-CM | POA: Diagnosis not present

## 2021-11-03 DIAGNOSIS — K219 Gastro-esophageal reflux disease without esophagitis: Secondary | ICD-10-CM | POA: Diagnosis not present

## 2021-11-09 DIAGNOSIS — R5383 Other fatigue: Secondary | ICD-10-CM | POA: Diagnosis not present

## 2021-11-09 DIAGNOSIS — G894 Chronic pain syndrome: Secondary | ICD-10-CM | POA: Diagnosis not present

## 2021-11-09 DIAGNOSIS — R202 Paresthesia of skin: Secondary | ICD-10-CM | POA: Diagnosis not present

## 2021-11-09 DIAGNOSIS — F411 Generalized anxiety disorder: Secondary | ICD-10-CM | POA: Diagnosis not present

## 2021-11-10 DIAGNOSIS — R079 Chest pain, unspecified: Secondary | ICD-10-CM | POA: Diagnosis not present

## 2021-11-11 DIAGNOSIS — R000 Tachycardia, unspecified: Secondary | ICD-10-CM | POA: Diagnosis not present

## 2021-11-11 DIAGNOSIS — R079 Chest pain, unspecified: Secondary | ICD-10-CM | POA: Diagnosis not present

## 2021-11-13 DIAGNOSIS — R55 Syncope and collapse: Secondary | ICD-10-CM | POA: Diagnosis not present

## 2021-11-13 DIAGNOSIS — K200 Eosinophilic esophagitis: Secondary | ICD-10-CM | POA: Diagnosis not present

## 2021-11-13 DIAGNOSIS — R0601 Orthopnea: Secondary | ICD-10-CM | POA: Diagnosis not present

## 2021-11-13 DIAGNOSIS — R002 Palpitations: Secondary | ICD-10-CM | POA: Diagnosis not present

## 2021-11-13 DIAGNOSIS — R0609 Other forms of dyspnea: Secondary | ICD-10-CM | POA: Diagnosis not present

## 2021-11-13 DIAGNOSIS — R0789 Other chest pain: Secondary | ICD-10-CM | POA: Diagnosis not present

## 2021-11-13 DIAGNOSIS — R0600 Dyspnea, unspecified: Secondary | ICD-10-CM | POA: Diagnosis not present

## 2021-11-13 DIAGNOSIS — R42 Dizziness and giddiness: Secondary | ICD-10-CM | POA: Diagnosis not present

## 2021-11-15 ENCOUNTER — Ambulatory Visit: Payer: BC Managed Care – PPO

## 2021-11-20 ENCOUNTER — Encounter: Payer: Self-pay | Admitting: Neurology

## 2021-11-20 ENCOUNTER — Ambulatory Visit: Payer: BC Managed Care – PPO | Admitting: Neurology

## 2021-11-20 DIAGNOSIS — Z029 Encounter for administrative examinations, unspecified: Secondary | ICD-10-CM

## 2021-11-22 ENCOUNTER — Emergency Department
Admission: EM | Admit: 2021-11-22 | Discharge: 2021-11-22 | Disposition: A | Discharge status: Home / Self Care | Payer: BC Managed Care – PPO | Source: Home / Self Care | Attending: Family Medicine | Admitting: Family Medicine

## 2021-11-22 ENCOUNTER — Encounter: Payer: Self-pay | Admitting: Emergency Medicine

## 2021-11-22 DIAGNOSIS — R1013 Epigastric pain: Secondary | ICD-10-CM | POA: Diagnosis not present

## 2021-11-22 DIAGNOSIS — K200 Eosinophilic esophagitis: Secondary | ICD-10-CM | POA: Insufficient documentation

## 2021-11-22 DIAGNOSIS — K219 Gastro-esophageal reflux disease without esophagitis: Secondary | ICD-10-CM | POA: Insufficient documentation

## 2021-11-22 DIAGNOSIS — K2100 Gastro-esophageal reflux disease with esophagitis, without bleeding: Secondary | ICD-10-CM | POA: Diagnosis not present

## 2021-11-22 DIAGNOSIS — F418 Other specified anxiety disorders: Secondary | ICD-10-CM | POA: Insufficient documentation

## 2021-11-22 DIAGNOSIS — R072 Precordial pain: Secondary | ICD-10-CM | POA: Diagnosis not present

## 2021-11-22 DIAGNOSIS — K5900 Constipation, unspecified: Secondary | ICD-10-CM | POA: Diagnosis not present

## 2021-11-22 MED ORDER — ONDANSETRON 4 MG PO TBDP
4.0000 mg | ORAL_TABLET | Freq: Once | ORAL | Status: AC
  Administered 2021-11-22: 4 mg via ORAL

## 2021-11-22 MED ORDER — ALUM & MAG HYDROXIDE-SIMETH 200-200-20 MG/5ML PO SUSP
30.0000 mL | Freq: Once | ORAL | Status: AC
  Administered 2021-11-22: 30 mL via ORAL

## 2021-11-22 MED ORDER — ONDANSETRON 4 MG PO TBDP: 4.0000 mg | ORAL_TABLET | Freq: Three times a day (TID) | ORAL | 0 refills | Status: DC | PRN

## 2021-11-22 MED ORDER — KETOROLAC TROMETHAMINE 30 MG/ML IJ SOLN
30.0000 mg | Freq: Once | INTRAMUSCULAR | Status: AC
  Administered 2021-11-22: 30 mg via INTRAMUSCULAR

## 2021-11-22 MED ORDER — LIDOCAINE VISCOUS HCL 2 % MT SOLN
15.0000 mL | Freq: Once | OROMUCOSAL | Status: AC
  Administered 2021-11-22: 15 mL via ORAL

## 2021-11-22 NOTE — ED Provider Notes (Signed)
[facility]    CSN: [id number] Arrival date & time: 11/22/21  0908      History   Chief Complaint Chief Complaint  Patient presents with   Gastroesophageal Reflux    HPI Stefanie Lucero is a 31 y.o. female.   HPI  Gender: Is here for GERD.  She is a heavy user of emergency medicine and has been to the ER or urgent care 12 times so far in 2023.  Largely has chest pain that is noncardiac GERD symptoms and nausea.  She has seen gastroenterology, family medicine, cardiology, neurology, and a second cardiologist.  She has a significant anxiety disorder that I think is affecting her health, causing some matization and dissatisfaction with her consultations to date. She is here today because she ate food last night that did not agree with her, and this morning has acid reflux.  She took some Mylanta.  It did not work so she is here for GI cocktail.  No nausea or vomiting.  No chest pain or shortness of breath.  No palpitations  Past Medical History:  Diagnosis Date   Anxiety    Anxiety    Asthma    Eosinophilic esophagitis    Gastric ulcer    Gastritis    Headaches, cluster    Migraine    Migraine headache    Pancreatic mass    STD (sexually transmitted disease)    trichomonas treated 2020    Patient Active Problem List   Diagnosis Date Noted   Eosinophilic esophagitis 11/22/2021   GERD (gastroesophageal reflux disease) 11/22/2021   Anxiety disorder with care-seeking behavior 11/22/2021   Chest pain of uncertain etiology 05/29/2021   Palpitations 05/29/2021   Shortness of breath 05/29/2021   History of trichomoniasis 12/07/2019   Intractable migraine 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 07/05/2016    Past Surgical History:  Procedure Laterality Date   CESAREAN SECTION     x 2   ESOPHAGEAL MANOMETRY N/A 10/07/2021   Procedure: ESOPHAGEAL MANOMETRY (EM);  Surgeon: Kathi Der, MD;  Location: [facility];  Service: Gastroenterology;  Laterality:  N/A;   INTRAUTERINE DEVICE (IUD) INSERTION     mirena inserted 12-07-2019   PH IMPEDANCE STUDY  10/07/2021   Procedure: PH IMPEDANCE STUDY;  Surgeon: Kathi Der, MD;  Location: [facility];  Service: Gastroenterology;;   TONSILLECTOMY  2001    OB History     Gravida  2   Para  2   Term  2   Preterm      AB      Living  2      SAB      IAB      Ectopic      Multiple      Live Births  2            Home Medications    Prior to Admission medications   Medication Sig Start Date End Date Taking? Authorizing Provider  acetaminophen (TYLENOL) 500 MG tablet Take 1,000 mg by mouth every 6 (six) hours as needed for moderate pain or headache.    [provider]  albuterol (VENTOLIN HFA) 108 (90 Base) MCG/ACT inhaler Inhale 2 puffs into the lungs every 4 (four) hours as needed for wheezing or shortness of breath. 11/06/20   [provider]  diclofenac Sodium (VOLTAREN) 1 % GEL Apply 2 g topically 4 (four) times daily as needed. 06/16/21   Long, Arlyss Repress, MD  famotidine (PEPCID) 20 MG tablet  Take 1 tablet (20 mg total) by mouth 2 (two) times daily. 09/14/21   Palumbo, April, MD  FLUoxetine (PROZAC) 10 MG capsule Take 10 mg by mouth daily. 11/09/21   [provider]  gabapentin (NEURONTIN) 100 MG capsule Take 100 mg by mouth 3 (three) times daily as needed. 11/09/21   [provider]  lansoprazole (PREVACID) 30 MG capsule Take 1 capsule (30 mg total) by mouth 2 (two) times daily before a meal. 05/25/21 08/23/21  Theadora Rama Scales, PA-C  norelgestromin-ethinyl estradiol Burr Medico) 150-35 MCG/24HR transdermal patch Place 1 patch onto the skin once a week. 10/01/21   Olivia Mackie, NP  ondansetron (ZOFRAN ODT) 4 MG disintegrating tablet Take 1 tablet (4 mg total) by mouth every 8 (eight) hours as needed for nausea or vomiting. 11/22/21   Eustace Moore, MD  sucralfate (CARAFATE) 1 GM/10ML suspension Take 10 mLs (1 g total) by mouth 4  (four) times daily -  with meals and at bedtime. Patient not taking: Reported on 10/01/2021 08/23/21   Palumbo, April, MD  etonogestrel-ethinyl estradiol (NUVARING) 0.12-0.015 MG/24HR vaginal ring Place 1 each vaginally every 28 (twenty-eight) days. Insert vaginally and leave in place for 3 consecutive weeks, then remove for 1 week. Patient not taking: No sig reported 10/02/20 11/12/20  Olivia Mackie, NP    Family History Family History  Problem Relation Age of Onset   Hypertension Mother    Stroke Father    Heart attack Father        times 2   Hypertension Father    Dementia Maternal Grandmother    Alzheimer's disease Maternal Grandmother    Irregular heart beat Maternal Grandmother    Stroke Maternal Grandfather    Gallbladder disease Maternal Grandfather    Colon cancer Paternal Grandfather 89            Social History Social History   Tobacco Use   Smoking status: Never   Smokeless tobacco: Never  Vaping Use   Vaping Use: Never used  Substance Use Topics   Alcohol use: Yes    Comment: occ   Drug use: No     Allergies   Eggs or egg-derived products, Nutmeg oil (myristica oil), Cefdinir, Ceftin [cefuroxime], Guggulipid-black pepper, Iodine, and Nickel   Review of Systems Review of Systems See HPI  Physical Exam Triage Vital Signs ED Triage Vitals  Enc Vitals Group     BP 11/22/21 0920 115/82     Pulse Rate 11/22/21 0920 93     Resp 11/22/21 0920 16     Temp 11/22/21 0920 98.6 F (37 C)     Temp Source 11/22/21 0920 Oral     SpO2 11/22/21 0920 97 %     Weight 11/22/21 0925 185 lb (83.9 kg)     Height 11/22/21 0925 5\' 3"  (1.6 m)     Head Circumference --      Peak Flow --      Pain Score 11/22/21 0924 6     Pain Loc --      Pain Edu? --      Excl. in GC? --    No data found.  Updated Vital Signs BP 115/82 (BP Location: Right Arm)   Pulse 93   Temp 98.6 F (37 C) (Oral)   Resp 16   Ht 5\' 3"  (1.6 m)   Wt 83.9 kg   LMP 11/12/2021 (Exact Date)    SpO2 97%   BMI 32.77 kg/m  Physical Exam Constitutional:      General: She is not in acute distress.    Appearance: She is well-developed. She is not ill-appearing.  HENT:     Head: Normocephalic and atraumatic.  Eyes:     Conjunctiva/sclera: Conjunctivae normal.     Pupils: Pupils are equal, round, and reactive to light.  Cardiovascular:     Rate and Rhythm: Normal rate and regular rhythm.     Heart sounds: Normal heart sounds.  Pulmonary:     Effort: Pulmonary effort is normal. No respiratory distress.     Breath sounds: Normal breath sounds.  Abdominal:     General: There is no distension.     Palpations: Abdomen is soft.     Tenderness: There is abdominal tenderness.     Comments: Tenderness to deep palpation of the midepigastrium  Musculoskeletal:        General: Normal range of motion.     Cervical back: Normal range of motion.  Skin:    General: Skin is warm and dry.  Neurological:     General: No focal deficit present.     Mental Status: She is alert.  Psychiatric:        Mood and Affect: Mood normal.        Behavior: Behavior normal.     UC Treatments / Results  Labs (all labs ordered are listed, but only abnormal results are displayed) Labs Reviewed - No data to display  EKG   Radiology No results found.  Procedures Procedures (including critical care time)  Medications Ordered in UC Medications  ondansetron (ZOFRAN-ODT) disintegrating tablet 4 mg (4 mg Oral Given 11/22/21 0925)  alum & mag hydroxide-simeth (MAALOX/MYLANTA) 200-200-20 MG/5ML suspension 30 mL (30 mLs Oral Given 11/22/21 0943)    And  lidocaine (XYLOCAINE) 2 % viscous mouth solution 15 mL (15 mLs Oral Given 11/22/21 0943)  ketorolac (TORADOL) 30 MG/ML injection 30 mg (30 mg Intramuscular Given 11/22/21 1008)    Initial Impression / Assessment and Plan / UC Course  I have reviewed the triage vital signs and the nursing notes.  Pertinent labs & imaging results that were  available during my care of the patient were reviewed by me and considered in my medical decision making (see chart for details).     Partial improvement with GI cocktail.  Zofran is given for discomfort as this is helped her in the past.  Home to follow-up with her usual doctors. Final Clinical Impressions(s) / UC Diagnoses   Final diagnoses:  Abdominal pain, epigastric  Gastroesophageal reflux disease with esophagitis, unspecified whether hemorrhage     Discharge Instructions      Try elevating the head of your bed Drink plenty of water Watch your diet See your doctor on Friday   ED Prescriptions     Medication Sig Dispense Auth. Provider   ondansetron (ZOFRAN ODT) 4 MG disintegrating tablet Take 1 tablet (4 mg total) by mouth every 8 (eight) hours as needed for nausea or vomiting. 20 tablet Eustace Moore, MD      PDMP not reviewed this encounter.   Eustace Moore, MD 11/22/21 1034

## 2021-11-22 NOTE — ED Triage Notes (Addendum)
Ate chicken & cheese last night  ?Reflux sinc e1 am - took pepcid, last dose of zofran, mylanta - no relief  ?Continues with nausea ?Sees GI on Friday ?GI cocktail has helped in past  ?Wearing  a Holter monitor ?

## 2021-11-22 NOTE — Discharge Instructions (Signed)
Try elevating the head of your bed ?Drink plenty of water ?Watch your diet ?See your doctor on Friday ?

## 2021-11-23 DIAGNOSIS — I498 Other specified cardiac arrhythmias: Secondary | ICD-10-CM | POA: Diagnosis not present

## 2021-11-23 NOTE — Progress Notes (Signed)
? ?NEUROLOGY FOLLOW UP OFFICE NOTE ? ?CHANON LONEY ?[id number] ? ?Assessment/Plan:  ? ?Near-syncope - if cardiac etiology ruled out, consider migraine-related ?Migraine with and without aura, without status migrainosus, not intractable ?  ?Migraine prevention:  start Emgality.  Cannot take Aimovig due to reported latex allergy ?Migraine rescue:  While underlying cardiac etiology for her chest pain is unlikely, she should not be taking a triptan while workup is ongoing.  Will give her samples of Ubrelvy to try for now.   Zofran for nausea.  If/when cardiac workup negative, then we can try another triptan such as rizatriptan.   ?STOP TYLENOL.  Limit use of pain relievers to no more than 2 days out of week to prevent risk of rebound or medication-overuse headache. ?Keep headache diary ?Follow up 4-5 months ?  ?  ?  ?Subjective:  ?KENITRA LEVENTHAL is a 31 year old female with asthma and anxiety who follows up for migraines. ? ?UPDATE: ?MRI of brain with and without contrast on 08/01/2021 personally reviewed was normal.  EEG on 09/22/2021 was normal.   ? ?Started nortriptyline in December.  She stopped taking it because she said it made her sick to her stomach.  Prescribed rizatriptan but never picked it up.  She has subsequently been to the ED on several occasions for chest pain.  She was diagnosed with costochondritis.  She came to the ED on 3/26 with associated dizziness and intermittent left arm tingling.  CT head personally reviewed was unremarkable.  She is undergoing cardiac workup with event monitor and supposed to have a stress test.  She is reporting worsening headaches.  She has daily head pressure.  Migraines occur 3 to 4 times a week, lasting 3-4 hours with sumatriptan.  Taking Tylenol three times daily.   ? ?Current NSAIDS/analgesics:  acetaminophen ?Current triptans:  rizatriptan 10mg  ?Current ergotamine:  none ?Current anti-emetic:  Zofran 4mg  ?Current muscle relaxants:  none ?Current Antihypertensive  medications:  none ?Current Antidepressant medications:  none ?Current Anticonvulsant medications:  none ?Current anti-CGRP:  none ?Current Vitamins/Herbal/Supplements:  none ?Current Antihistamines/Decongestants:  meclizine ?Other therapy:  none ?Hormone/birth control:  none ? ?Caffeine:  1 to 2 diet sodas daily.  No coffee ?Diet:  at least 30 oz water daily.  1-2 diet sodas daily.  Now tries not to skip meals ?Exercise:  none ?Depression:  yes.  Stable; Anxiety:  yes ?Other pain:  Central chest and bilateral shoulder pain (query fibromyalgia ?Sleep hygiene:  9 PM to 5:30 AM.  ? ?HISTORY:  ?She has had migraines since age 76.  They were severe until age 89.  They now occur 1 or 2 times a month.  They are severe bilateral fronto-temporal pressure/throbbing with nausea, photophobia, phonophobia, as well as visual aura (sees rings of light or blurred vision).  They typically last 24 hours (2-3 hours to 3 days).  Takes sumatriptan and goes to sleep.  If at work, only Tylenol. ?  ?Since September 2022, she started having dizzy spells.  It happens at any time, with or without movement.  It happens when driving, sitting or standing.  She develops severe ringing in ears and everybody sounds like they are talking slowly.  She feels nauseous and feels like she is going to pass out but doesn't lose consciousness.  No associated headaches, diaphoresis, or palpitations.  She looks like she has a blank stare.  They last 5-10 minutes.  They have been occurring once to twice a week.  She has tried  not to skip meals now. ?  ?  ?CT head without contrast on 03/08/2021 showed "no acute intracranial abnormality".  CTV of head on 10/09/2020 personally reviewed was negative for dural sinus thrombosis.  CT head on 07/04/2016 personally reviewed was normal ?  ? ?  ?Past NSAIDS/analgesics:  ibuprofen (GERD) ?Past abortive triptans:  sumatriptan tab ?Past abortive ergotamine:  none ?Past muscle relaxants:  none ?Past anti-emetic:  none ?Past  antihypertensive medications:  atenolol ?Past antidepressant medications:  nortriptyline, duloxetine, sertraline ?Past anticonvulsant medications:  topiramate ?Past anti-CGRP:  none ?Past vitamins/Herbal/Supplements:  none ?Past antihistamines/decongestants:  loratidine, cetirizine, pseudoephedrine, Flonase ?Other past therapies:  none ?  ? ?Family history of migraines:  No ? ?PAST MEDICAL HISTORY: ?Past Medical History:  ?Diagnosis Date  ? Anxiety   ? Anxiety   ? Asthma   ? Eosinophilic esophagitis   ? Gastric ulcer   ? Gastritis   ? Headaches, cluster   ? Migraine   ? Migraine headache   ? Pancreatic mass   ? STD (sexually transmitted disease)   ? trichomonas treated 2020  ? ? ?MEDICATIONS: ?Current Outpatient Medications on File Prior to Visit  ?Medication Sig Dispense Refill  ? acetaminophen (TYLENOL) 500 MG tablet Take 1,000 mg by mouth every 6 (six) hours as needed for moderate pain or headache.    ? albuterol (VENTOLIN HFA) 108 (90 Base) MCG/ACT inhaler Inhale 2 puffs into the lungs every 4 (four) hours as needed for wheezing or shortness of breath.    ? diclofenac Sodium (VOLTAREN) 1 % GEL Apply 2 g topically 4 (four) times daily as needed. 100 g 0  ? famotidine (PEPCID) 20 MG tablet Take 1 tablet (20 mg total) by mouth 2 (two) times daily. 14 tablet 0  ? FLUoxetine (PROZAC) 10 MG capsule Take 10 mg by mouth daily.    ? gabapentin (NEURONTIN) 100 MG capsule Take 100 mg by mouth 3 (three) times daily as needed.    ? lansoprazole (PREVACID) 30 MG capsule Take 1 capsule (30 mg total) by mouth 2 (two) times daily before a meal. 60 capsule 2  ? norelgestromin-ethinyl estradiol [employer](XULANE) 150-35 MCG/24HR transdermal patch Place 1 patch onto the skin once a week. 9 patch 3  ? ondansetron (ZOFRAN ODT) 4 MG disintegrating tablet Take 1 tablet (4 mg total) by mouth every 8 (eight) hours as needed for nausea or vomiting. 20 tablet 0  ? sucralfate (CARAFATE) 1 GM/10ML suspension Take 10 mLs (1 g total) by mouth 4 (four)  times daily -  with meals and at bedtime. (Patient not taking: Reported on 10/01/2021) 420 mL 0  ? [DISCONTINUED] etonogestrel-ethinyl estradiol (NUVARING) 0.12-0.015 MG/24HR vaginal ring Place 1 each vaginally every 28 (twenty-eight) days. Insert vaginally and leave in place for 3 consecutive weeks, then remove for 1 week. (Patient not taking: No sig reported) 3 each 3  ? ?No current facility-administered medications on file prior to visit.  ? ? ?ALLERGIES: ?Allergies  ?Allergen Reactions  ? Eggs Or Egg-Derived Products Anaphylaxis and Hives  ? Nutmeg Oil (Myristica Oil) Anaphylaxis  ? Cefdinir Hives  ? Ceftin [Cefuroxime] Hives  ? Guggulipid-Black Pepper Hives  ?  & White Pepper   ? Iodine Other (See Comments)  ?  Pt states "mouth itches"  ? Nickel Rash  ? ? ?FAMILY HISTORY: ?Family History  ?Problem Relation Age of Onset  ? Hypertension Mother   ? Stroke Father   ? Heart attack Father   ?     times  2  ? Hypertension Father   ? Dementia Maternal Grandmother   ? Alzheimer's disease Maternal Grandmother   ? Irregular heart beat Maternal Grandmother   ? Stroke Maternal Grandfather   ? Gallbladder disease Maternal Grandfather   ? Colon cancer Paternal Grandfather 100  ?        ? ? ?  ?Objective:  ?Blood pressure 124/89, pulse 82, resp. rate 18, height 5\' 3"  (1.6 m), weight 188 lb (85.3 kg), last menstrual period 11/12/2021, SpO2 96 %. ?General: No acute distress.  Patient appears well-groomed.   ?Head:  Normocephalic/atraumatic ?Eyes:  Fundi examined but not visualized ?Neck: supple, no paraspinal tenderness, full range of motion ?Heart:  Regular rate and rhythm ?Lungs:  Clear to auscultation bilaterally ?Back: No paraspinal tenderness ?Neurological Exam: alert and oriented to person, place, and time.  Speech fluent and not dysarthric, language intact.  CN II-XII intact. Bulk and tone normal, muscle strength 5/5 throughout.  Sensation to light touch intact.  Deep tendon reflexes 2+ throughout.  Finger to nose testing  intact.  Gait normal, Romberg negative. ? ? ?11/14/2021, DO ? ?CC: Shon Millet, MD ? ? ? ? ? ? ?

## 2021-11-24 ENCOUNTER — Ambulatory Visit: Payer: BC Managed Care – PPO | Admitting: Neurology

## 2021-11-24 ENCOUNTER — Encounter: Payer: Self-pay | Admitting: Neurology

## 2021-11-24 VITALS — BP 124/89 | HR 82 | Resp 18 | Ht 63.0 in | Wt 188.0 lb

## 2021-11-24 DIAGNOSIS — G43009 Migraine without aura, not intractable, without status migrainosus: Secondary | ICD-10-CM | POA: Diagnosis not present

## 2021-11-24 DIAGNOSIS — R42 Dizziness and giddiness: Secondary | ICD-10-CM

## 2021-11-24 MED ORDER — EMGALITY 120 MG/ML ~~LOC~~ SOAJ: 120.0000 mg | SUBCUTANEOUS | 5 refills | Status: DC

## 2021-11-24 MED ORDER — EMGALITY 120 MG/ML ~~LOC~~ SOAJ: 240.0000 mg | Freq: Once | SUBCUTANEOUS | 0 refills | Status: AC

## 2021-11-24 NOTE — Patient Instructions (Addendum)
?  Start [employer].  Take 2 injections for first dose, then 1 injection every 28 days thereafter.   ?Take Ubrelvy 100mg  at earliest onset of headache.  May repeat dose once in 2 hours if needed.  Maximum 2 tablets in 24 hours.  STOP TYLENOL ?Limit use of pain relievers to no more than 2 days out of the week.  These medications include acetaminophen, NSAIDs (ibuprofen/Advil/Motrin, naproxen/Aleve, triptans (Imitrex/sumatriptan), Excedrin, and narcotics.  This will help reduce risk of rebound headaches. ?Be aware of common food triggers: ? - Caffeine:  coffee, black tea, cola, Mt. Dew ? - Chocolate ? - Dairy:  aged cheeses (brie, blue, cheddar, gouda, Moneta, provolone, [place], Swiss, etc), chocolate milk, buttermilk, sour cream, limit eggs and yogurt ? - Nuts, peanut butter ? - Alcohol ? - Cereals/grains:  FRESH breads (fresh bagels, sourdough, doughnuts), yeast productions ? - Processed/canned/aged/cured meats (pre-packaged deli meats, hotdogs) ? - MSG/glutamate:  soy sauce, flavor enhancer, pickled/preserved/marinated foods ? - Sweeteners:  aspartame (Equal, Nutrasweet).  Sugar and Splenda are okay ? - Vegetables:  legumes (lima beans, lentils, snow peas, fava beans, pinto peans, peas, garbanzo beans), sauerkraut, onions, olives, pickles ? - Fruit:  avocados, bananas, citrus fruit (orange, lemon, grapefruit), mango ? - Other:  Frozen meals, macaroni and cheese ?Routine exercise ?Stay adequately hydrated (aim for 64 oz water daily) ?Keep headache diary ?Maintain proper stress management ?Maintain proper sleep hygiene ?Do not skip meals ?Consider supplements:  magnesium citrate 400mg  daily, riboflavin 400mg  daily, coenzyme Q10 100mg  three times daily. ?FOLLOW UP 4-5 MONTHS ? ?

## 2021-11-24 NOTE — Progress Notes (Signed)
Medication Samples have been provided to the patient. ? ?Drug name: Bernita Raisin       Strength: 100 mg        Qty: 4  LOT: [id number]  Exp.Date: 12/2022 ? ?Dosing instructions: as needed ? ?The patient has been instructed regarding the correct time, dose, and frequency of taking this medication, including desired effects and most common side effects.  ? ?Leida Lauth ?3:38 PM ?11/24/2021  ?

## 2021-11-25 ENCOUNTER — Emergency Department (HOSPITAL_BASED_OUTPATIENT_CLINIC_OR_DEPARTMENT_OTHER): Payer: BC Managed Care – PPO

## 2021-11-25 ENCOUNTER — Other Ambulatory Visit: Payer: Self-pay

## 2021-11-25 ENCOUNTER — Telehealth (HOSPITAL_COMMUNITY): Payer: Self-pay | Admitting: Pharmacy Technician

## 2021-11-25 ENCOUNTER — Other Ambulatory Visit (HOSPITAL_COMMUNITY): Payer: Self-pay

## 2021-11-25 ENCOUNTER — Telehealth: Payer: Self-pay

## 2021-11-25 ENCOUNTER — Emergency Department (HOSPITAL_BASED_OUTPATIENT_CLINIC_OR_DEPARTMENT_OTHER)
Admission: EM | Admit: 2021-11-25 | Discharge: 2021-11-25 | Disposition: A | Discharge status: Home / Self Care | Payer: BC Managed Care – PPO | Attending: Emergency Medicine | Admitting: Emergency Medicine

## 2021-11-25 ENCOUNTER — Encounter (HOSPITAL_BASED_OUTPATIENT_CLINIC_OR_DEPARTMENT_OTHER): Payer: Self-pay

## 2021-11-25 DIAGNOSIS — K219 Gastro-esophageal reflux disease without esophagitis: Secondary | ICD-10-CM | POA: Insufficient documentation

## 2021-11-25 DIAGNOSIS — K5900 Constipation, unspecified: Secondary | ICD-10-CM | POA: Insufficient documentation

## 2021-11-25 DIAGNOSIS — R112 Nausea with vomiting, unspecified: Secondary | ICD-10-CM | POA: Diagnosis not present

## 2021-11-25 DIAGNOSIS — R1011 Right upper quadrant pain: Secondary | ICD-10-CM | POA: Diagnosis not present

## 2021-11-25 DIAGNOSIS — R110 Nausea: Secondary | ICD-10-CM | POA: Insufficient documentation

## 2021-11-25 DIAGNOSIS — R1013 Epigastric pain: Secondary | ICD-10-CM

## 2021-11-25 DIAGNOSIS — R109 Unspecified abdominal pain: Secondary | ICD-10-CM | POA: Diagnosis not present

## 2021-11-25 LAB — COMPREHENSIVE METABOLIC PANEL
ALT: 17 U/L (ref 0–44)
AST: 18 U/L (ref 15–41)
Albumin: 4.2 g/dL (ref 3.5–5.0)
Alkaline Phosphatase: 57 U/L (ref 38–126)
Anion gap: 8 (ref 5–15)
BUN: 6 mg/dL (ref 6–20)
CO2: 24 mmol/L (ref 22–32)
Calcium: 8.8 mg/dL — ABNORMAL LOW (ref 8.9–10.3)
Chloride: 109 mmol/L (ref 98–111)
Creatinine, Ser: 0.69 mg/dL (ref 0.44–1.00)
GFR, Estimated: >60 mL/min (ref >60)
Glucose, Bld: 113 mg/dL — ABNORMAL HIGH (ref 70–99)
Potassium: 3.8 mmol/L (ref 3.5–5.1)
Sodium: 141 mmol/L (ref 135–145)
Total Bilirubin: 0.5 mg/dL (ref 0.3–1.2)
Total Protein: 7.4 g/dL (ref 6.5–8.1)

## 2021-11-25 LAB — CBC
HCT: 38.4 % (ref 36.0–46.0)
Hemoglobin: 12.2 g/dL (ref 12.0–15.0)
MCH: 27.5 pg (ref 26.0–34.0)
MCHC: 31.8 g/dL (ref 30.0–36.0)
MCV: 86.7 fL (ref 80.0–100.0)
Platelets: 287 10*3/uL (ref 150–400)
RBC: 4.43 MIL/uL (ref 3.87–5.11)
RDW: 14.3 % (ref 11.5–15.5)
WBC: 7.7 10*3/uL (ref 4.0–10.5)
nRBC: 0 % (ref 0.0–0.2)

## 2021-11-25 LAB — URINALYSIS, ROUTINE W REFLEX MICROSCOPIC
Bilirubin Urine: NEGATIVE
Glucose, UA: NEGATIVE mg/dL
Ketones, ur: NEGATIVE mg/dL
Leukocytes,Ua: NEGATIVE
Nitrite: NEGATIVE
Protein, ur: NEGATIVE mg/dL
Specific Gravity, Urine: 1.02 (ref 1.005–1.030)
pH: 7 (ref 5.0–8.0)

## 2021-11-25 LAB — URINALYSIS, MICROSCOPIC (REFLEX)

## 2021-11-25 LAB — LIPASE, BLOOD: Lipase: 28 U/L (ref 11–51)

## 2021-11-25 LAB — PREGNANCY, URINE: Preg Test, Ur: NEGATIVE

## 2021-11-25 MED ORDER — SODIUM CHLORIDE 0.9 % IV BOLUS
1000.0000 mL | Freq: Once | INTRAVENOUS | Status: AC
  Administered 2021-11-25: 1000 mL via INTRAVENOUS

## 2021-11-25 MED ORDER — ONDANSETRON HCL 4 MG/2ML IJ SOLN
4.0000 mg | Freq: Once | INTRAMUSCULAR | Status: AC
  Administered 2021-11-25: 4 mg via INTRAVENOUS
  Filled 2021-11-25: qty 2

## 2021-11-25 MED ORDER — IOHEXOL 300 MG/ML  SOLN: 100.0000 mL | Freq: Once | INTRAMUSCULAR | Status: DC | PRN

## 2021-11-25 MED ORDER — ALUM & MAG HYDROXIDE-SIMETH 200-200-20 MG/5ML PO SUSP
30.0000 mL | Freq: Once | ORAL | Status: AC
  Administered 2021-11-25: 30 mL via ORAL
  Filled 2021-11-25: qty 30

## 2021-11-25 MED ORDER — PANTOPRAZOLE SODIUM 20 MG PO TBEC: 20.0000 mg | DELAYED_RELEASE_TABLET | Freq: Every day | ORAL | 0 refills | Status: DC

## 2021-11-25 MED ORDER — LIDOCAINE VISCOUS HCL 2 % MT SOLN
15.0000 mL | Freq: Once | OROMUCOSAL | Status: AC
Start: 2021-11-25 — End: 2021-11-25
  Administered 2021-11-25: 15 mL via ORAL
  Filled 2021-11-25: qty 15

## 2021-11-25 MED ORDER — DICYCLOMINE HCL 10 MG PO CAPS
10.0000 mg | ORAL_CAPSULE | Freq: Once | ORAL | Status: AC
  Administered 2021-11-25: 10 mg via ORAL
  Filled 2021-11-25: qty 1

## 2021-11-25 NOTE — ED Provider Notes (Signed)
?[facility] EMERGENCY DEPARTMENT ?Provider Note ? ? ?CSN: [id number] ?Arrival date & time: 11/25/21  1539 ? ?  ? ?History ? ?Chief Complaint  ?Patient presents with  ? Abdominal Pain  ? ? ?Stefanie Lucero is a 31 y.o. female. ? ?Patient with history of GERD, eosinophilic esophagitis, and anxiety presents today with complaints of RUQ abdominal pain. She states that same has been ongoing for the past 4-5 days with worsening this morning. States that she has been trying to eat only bland foods recently as she has had worsening reflux pain recently, states that she ate chicken tenders last night which exacerbated her pain. She states that this pain is new for her from her typical GERD pain and radiates into her left shoulder and throughout her abdomen. She also endorses nausea without vomiting. She states that she took her home Zofran prior to arrival without relief. States that she has not had a full bowel movement in 4 days and has been having more flatus than normal. Patient states that her LMP was 4/20.  ? ?The history is provided by the patient. No language interpreter was used.  ?Abdominal Pain ?Associated symptoms: constipation and nausea   ?Associated symptoms: no chills, no dysuria and no fever   ? ?  ? ?Home Medications ?Prior to Admission medications   ?Medication Sig Start Date End Date Taking? Authorizing Provider  ?acetaminophen (TYLENOL) 500 MG tablet Take 1,000 mg by mouth every 6 (six) hours as needed for moderate pain or headache.    [provider]  ?albuterol (VENTOLIN HFA) 108 (90 Base) MCG/ACT inhaler Inhale 2 puffs into the lungs every 4 (four) hours as needed for wheezing or shortness of breath. 11/06/20   [provider]  ?diclofenac Sodium (VOLTAREN) 1 % GEL Apply 2 g topically 4 (four) times daily as needed. 06/16/21   Long, Arlyss RepressJoshua G, MD  ?famotidine (PEPCID) 20 MG tablet Take 1 tablet (20 mg total) by mouth 2 (two) times daily. 09/14/21   Palumbo, April, MD   ?FLUoxetine (PROZAC) 10 MG capsule Take 10 mg by mouth daily. 11/09/21   [provider]  ?gabapentin (NEURONTIN) 100 MG capsule Take 100 mg by mouth 3 (three) times daily as needed. 11/09/21   [provider]  ?Galcanezumab-gnlm (EMGALITY) 120 MG/ML SOAJ Inject 120 mg into the skin every 28 (twenty-eight) days. 11/24/21   Drema DallasJaffe, Adam R, DO  ?lansoprazole (PREVACID) 30 MG capsule Take 1 capsule (30 mg total) by mouth 2 (two) times daily before a meal. 05/25/21 08/23/21  Theadora RamaMorgan, Lindsay Scales, PA-C  ?norelgestromin-ethinyl estradiol [employer] Medico(XULANE) 150-35 MCG/24HR transdermal patch Place 1 patch onto the skin once a week. 10/01/21   Olivia MackieWallace, Tiffany A, NP  ?ondansetron (ZOFRAN ODT) 4 MG disintegrating tablet Take 1 tablet (4 mg total) by mouth every 8 (eight) hours as needed for nausea or vomiting. 11/22/21   Eustace MooreNelson, Yvonne Sue, MD  ?sucralfate (CARAFATE) 1 GM/10ML suspension Take 10 mLs (1 g total) by mouth 4 (four) times daily -  with meals and at bedtime. ?Patient not taking: Reported on 10/01/2021 08/23/21   Palumbo, April, MD  ?etonogestrel-ethinyl estradiol (NUVARING) 0.12-0.015 MG/24HR vaginal ring Place 1 each vaginally every 28 (twenty-eight) days. Insert vaginally and leave in place for 3 consecutive weeks, then remove for 1 week. ?Patient not taking: No sig reported 10/02/20 11/12/20  Olivia MackieWallace, Tiffany A, NP  ?   ? ?Allergies    ?Eggs or egg-derived products, Nutmeg oil (myristica oil), Cefdinir, Ceftin [cefuroxime], Guggulipid-black pepper,  Iodine, and Nickel   ? ?Review of Systems   ?Review of Systems  ?Constitutional:  Negative for chills and fever.  ?Gastrointestinal:  Positive for abdominal pain, constipation and nausea.  ?Genitourinary:  Negative for dysuria.  ?All other systems reviewed and are negative. ? ?Physical Exam ?Updated Vital Signs ?BP 124/80   Pulse 98   Temp 97.9 ?F (36.6 ?C) (Oral)   Resp 18   Ht 5\' 3"  (1.6 m)   Wt 83.9 kg   LMP 11/12/2021 (Exact Date)   SpO2 99%   BMI 32.77  kg/m?  ?Physical Exam ?Vitals and nursing note reviewed.  ?Constitutional:   ?   General: She is not in acute distress. ?   Appearance: Normal appearance. She is normal weight. She is not ill-appearing, toxic-appearing or diaphoretic.  ?HENT:  ?   Head: Normocephalic and atraumatic.  ?Cardiovascular:  ?   Rate and Rhythm: Normal rate and regular rhythm.  ?   Heart sounds: Normal heart sounds.  ?Pulmonary:  ?   Effort: Pulmonary effort is normal. No respiratory distress.  ?   Breath sounds: Normal breath sounds.  ?Abdominal:  ?   General: Abdomen is flat.  ?   Palpations: Abdomen is soft.  ?   Tenderness: There is generalized abdominal tenderness and tenderness in the right upper quadrant.  ?Musculoskeletal:     ?   General: Normal range of motion.  ?   Cervical back: Normal range of motion.  ?Skin: ?   General: Skin is warm and dry.  ?Neurological:  ?   General: No focal deficit present.  ?   Mental Status: She is alert.  ?Psychiatric:     ?   Mood and Affect: Mood normal.     ?   Behavior: Behavior normal.  ? ? ?ED Results / Procedures / Treatments   ?Labs ?(all labs ordered are listed, but only abnormal results are displayed) ?Labs Reviewed  ?COMPREHENSIVE METABOLIC PANEL - Abnormal; Notable for the following components:  ?    Result Value  ? Glucose, Bld 113 (*)   ? Calcium 8.8 (*)   ? All other components within normal limits  ?URINALYSIS, ROUTINE W REFLEX MICROSCOPIC - Abnormal; Notable for the following components:  ? Hgb urine dipstick SMALL (*)   ? All other components within normal limits  ?URINALYSIS, MICROSCOPIC (REFLEX) - Abnormal; Notable for the following components:  ? Bacteria, UA RARE (*)   ? All other components within normal limits  ?LIPASE, BLOOD  ?CBC  ?PREGNANCY, URINE  ? ? ?EKG ?None ? ?Radiology ?11/14/2021 Abdomen Limited RUQ (LIVER/GB) ? ?Result Date: 11/25/2021 ?CLINICAL DATA:  Right upper quadrant with nausea vomiting EXAM: ULTRASOUND ABDOMEN LIMITED RIGHT UPPER QUADRANT COMPARISON:  CT abdomen  pelvis 09/14/2021 FINDINGS: Gallbladder: No gallstones or wall thickening visualized. No sonographic Murphy sign noted by sonographer. Common bile duct: Diameter: 2.6 mm Liver: No focal lesion identified. Within normal limits in parenchymal echogenicity. Portal vein is patent on color Doppler imaging with normal direction of blood flow towards the liver. Other: None. IMPRESSION: Negative right upper quadrant ultrasound. Electronically Signed   By: 09/16/2021 M.D.   On: 11/25/2021 18:00   ? ?Procedures ?Procedures  ? ? ?Medications Ordered in ED ?Medications  ?sodium chloride 0.9 % bolus 1,000 mL (1,000 mLs Intravenous New Bag/Given 11/25/21 1724)  ?alum & mag hydroxide-simeth (MAALOX/MYLANTA) 200-200-20 MG/5ML suspension 30 mL (30 mLs Oral Given 11/25/21 1714)  ?  And  ?lidocaine (XYLOCAINE) 2 % viscous mouth  solution 15 mL (15 mLs Oral Given 11/25/21 1714)  ?dicyclomine (BENTYL) capsule 10 mg (10 mg Oral Given 11/25/21 1713)  ?ondansetron (ZOFRAN) injection 4 mg (4 mg Intravenous Given 11/25/21 1725)  ? ? ?ED Course/ Medical Decision Making/ A&P ?  ?                        ?Medical Decision Making ?Amount and/or Complexity of Data Reviewed ?Labs: ordered. ?Radiology: ordered. ? ?Risk ?OTC drugs. ?Prescription drug management. ? ? ?This patient presents to the ED for concern of abdominal pain, this involves an extensive number of treatment options, and is a complaint that carries with it a high risk of complications and morbidity.  ? ? ?Co morbidities that complicate the patient evaluation ? ?Hx GERD ? ? ?Additional history obtained: ? ?Additional history obtained from previous UC and ED notes and imaging ? ?Lab Tests: ? ?I Ordered, and personally interpreted labs.  The pertinent results include:  hematuria on UA unchanged from baseline, no other acute laboratory findings ? ? ?Imaging Studies ordered: ? ?I ordered imaging studies including RUQ [place], CT abdomen pelvis with contrast  ?I independently visualized and  interpreted imaging which showed  ?[place]: no acute findings ?I agree with the radiologist interpretation ?CT pending at shift change ? ? ?Problem List / ED Course / Critical interventions / Medication management ? ?I ordered medication inc

## 2021-11-25 NOTE — ED Triage Notes (Signed)
Pt c/o pain to RUQ with radiation into R shoulder. Pt has been seen at [facility] and at [facility] for same. States the pain is getting worse.  ?

## 2021-11-25 NOTE — Telephone Encounter (Signed)
Patient Advocate Encounter ? ?Prior Authorization for [employer] 120MG /ML auto-injectors (migraine) has been approved.   ? ?PA# ?Effective dates: 11/25/2021 through 11/25/2022 ? ? ? ? ? ?01/25/2023, CPhT ?Pharmacy Patient Advocate Specialist ?[facility] Patient Advocate Team ?Direct Number: [phone]  Fax: [phone]  ?

## 2021-11-25 NOTE — ED Notes (Signed)
Ultrasound at bedside. Pt reports nausea has decreased ?

## 2021-11-25 NOTE — ED Provider Notes (Addendum)
Care of patient handed off to me by Lurena Nida at change of shift.  Please see her note for full work-up prior to handoff.  Briefly this is a 31 year old female who presents emergency department with abdominal pain.  She has a history of eosinophilic esophagitis yesterday..  She has been having right upper quadrant abdominal pain for 5 days that is worsened this morning.  She also endorses nausea and vomiting.   ?Physical Exam  ?BP 118/86   Pulse 93   Temp 97.9 ?F (36.6 ?C) (Oral)   Resp 16   Ht 5\' 3"  (1.6 m)   Wt 83.9 kg   LMP 11/12/2021 (Exact Date)   SpO2 100%   BMI 32.77 kg/m?  ? ?Physical Exam ?Vitals and nursing note reviewed.  ?HENT:  ?   Head: Normocephalic and atraumatic.  ?Eyes:  ?   General: No scleral icterus. ?Pulmonary:  ?   Effort: Pulmonary effort is normal. No respiratory distress.  ?Skin: ?   Findings: No rash.  ?Neurological:  ?   General: No focal deficit present.  ?   Mental Status: She is alert.  ?Psychiatric:     ?   Mood and Affect: Mood normal.     ?   Behavior: Behavior normal.     ?   Thought Content: Thought content normal.     ?   Judgment: Judgment normal.  ? ? ?Procedures  ?Procedures ? ?ED Course / MDM  ?  ?Medical Decision Making ?Amount and/or Complexity of Data Reviewed ?Labs: ordered. ?Radiology: ordered. ? ?Risk ?OTC drugs. ?Prescription drug management. ? ?CMP within normal limits ?CBC within normal limits, lipase negative, not pregnant, UA without UTI ?Right upper quadrant ultrasound normal ?CT pending at time of handoff.  Her CT abdomen pelvis without contrast is normal.  There are no acute findings. ?Prior to handoff patient was given Bentyl, GI cocktail, Zofran and fluids.  She is feeling somewhat improved.  I had a discussion with her at bedside about options for change of medications at home.  She is currently taking lansoprazole and Zofran.   ?I will provide her with Pantoprazole and instructed to stop Lansoprazole at this time. She has f/u with GI in 2 weeks.  Feel that she likely needs re-evaluation of her medication regimen to control her symptoms as she has been seen multiple times this year for same.  ? ?She is overall well appearing and non-toxic in appearance. Vital signs are stable.  ?Stable for discharge.  ? ?11/14/2021, PA-C ?11/25/21 2357 ? ?  ?01/25/22, PA-C ?11/26/21 0001 ? ?  ?01/26/22, MD ?11/30/21 1306 ? ?

## 2021-11-25 NOTE — Telephone Encounter (Signed)
Called patient per the patient's request. I have left a message X 2. Called today @ 2:24 and left voice mail message for patient to return my call. Patient reached out to patient experience office and wanted a call from the practice administrator to discuss some concerns regarding her office visit yesterday 11/24/21.  ?

## 2021-11-25 NOTE — Telephone Encounter (Signed)
Patient Advocate Encounter ?  ?Received notification  that prior authorization for [employer] 120MG /ML auto-injectors (migraine) is required. ?  ?PA submitted on 11/25/2021 ?Key 01/25/2022 ?Status is pending ?   ? ? ? ?[id number], CPhT ?Pharmacy Patient Advocate Specialist ?[facility] Patient Advocate Team ?Direct Number: [phone]  Fax: [phone]  ?

## 2021-11-25 NOTE — ED Notes (Signed)
Pt going to ct, no complaints at this time ?

## 2021-11-25 NOTE — Discharge Instructions (Signed)
You were seen in the emergency department today for abdominal pain. You work up was reassuring. We have placed you on a different PPI called Pantoprazole. Do not take this and your lansoprazole together. Please continue all of your medications as prescribed and follow-up with your GI on 12/07/21 as scheduled. Please return for any worsening symptoms or concerns.  ?

## 2021-11-27 DIAGNOSIS — R5383 Other fatigue: Secondary | ICD-10-CM | POA: Diagnosis not present

## 2021-11-27 DIAGNOSIS — R0789 Other chest pain: Secondary | ICD-10-CM | POA: Diagnosis not present

## 2021-11-27 DIAGNOSIS — Z20822 Contact with and (suspected) exposure to covid-19: Secondary | ICD-10-CM | POA: Diagnosis not present

## 2021-11-27 DIAGNOSIS — M791 Myalgia, unspecified site: Secondary | ICD-10-CM | POA: Diagnosis not present

## 2021-11-27 DIAGNOSIS — R079 Chest pain, unspecified: Secondary | ICD-10-CM | POA: Diagnosis not present

## 2021-11-27 DIAGNOSIS — R9431 Abnormal electrocardiogram [ECG] [EKG]: Secondary | ICD-10-CM | POA: Diagnosis not present

## 2021-12-01 ENCOUNTER — Other Ambulatory Visit: Payer: Self-pay

## 2021-12-01 ENCOUNTER — Encounter (HOSPITAL_BASED_OUTPATIENT_CLINIC_OR_DEPARTMENT_OTHER): Payer: Self-pay

## 2021-12-01 ENCOUNTER — Emergency Department (HOSPITAL_BASED_OUTPATIENT_CLINIC_OR_DEPARTMENT_OTHER)
Admission: EM | Admit: 2021-12-01 | Discharge: 2021-12-01 | Disposition: A | Discharge status: Home / Self Care | Payer: BC Managed Care – PPO | Attending: Emergency Medicine | Admitting: Emergency Medicine

## 2021-12-01 ENCOUNTER — Emergency Department (HOSPITAL_BASED_OUTPATIENT_CLINIC_OR_DEPARTMENT_OTHER): Payer: BC Managed Care – PPO

## 2021-12-01 DIAGNOSIS — R12 Heartburn: Secondary | ICD-10-CM | POA: Diagnosis not present

## 2021-12-01 DIAGNOSIS — R61 Generalized hyperhidrosis: Secondary | ICD-10-CM | POA: Diagnosis not present

## 2021-12-01 DIAGNOSIS — R0602 Shortness of breath: Secondary | ICD-10-CM | POA: Insufficient documentation

## 2021-12-01 DIAGNOSIS — R002 Palpitations: Secondary | ICD-10-CM | POA: Insufficient documentation

## 2021-12-01 DIAGNOSIS — J45909 Unspecified asthma, uncomplicated: Secondary | ICD-10-CM | POA: Diagnosis not present

## 2021-12-01 DIAGNOSIS — R0789 Other chest pain: Secondary | ICD-10-CM | POA: Diagnosis not present

## 2021-12-01 LAB — URINALYSIS, ROUTINE W REFLEX MICROSCOPIC
Bilirubin Urine: NEGATIVE
Glucose, UA: NEGATIVE mg/dL
Ketones, ur: NEGATIVE mg/dL
Leukocytes,Ua: NEGATIVE
Nitrite: NEGATIVE
Protein, ur: NEGATIVE mg/dL
Specific Gravity, Urine: 1.015 (ref 1.005–1.030)
pH: 7 (ref 5.0–8.0)

## 2021-12-01 LAB — CBC
HCT: 39 % (ref 36.0–46.0)
Hemoglobin: 12.6 g/dL (ref 12.0–15.0)
MCH: 27.7 pg (ref 26.0–34.0)
MCHC: 32.3 g/dL (ref 30.0–36.0)
MCV: 85.7 fL (ref 80.0–100.0)
Platelets: 239 10*3/uL (ref 150–400)
RBC: 4.55 MIL/uL (ref 3.87–5.11)
RDW: 14 % (ref 11.5–15.5)
WBC: 9 10*3/uL (ref 4.0–10.5)
nRBC: 0 % (ref 0.0–0.2)

## 2021-12-01 LAB — RAPID URINE DRUG SCREEN, HOSP PERFORMED
Amphetamines: NOT DETECTED
Barbiturates: NOT DETECTED
Benzodiazepines: NOT DETECTED
Cocaine: NOT DETECTED
Opiates: NOT DETECTED
Tetrahydrocannabinol: NOT DETECTED

## 2021-12-01 LAB — BASIC METABOLIC PANEL
Anion gap: 7 (ref 5–15)
BUN: 11 mg/dL (ref 6–20)
CO2: 26 mmol/L (ref 22–32)
Calcium: 9.1 mg/dL (ref 8.9–10.3)
Chloride: 106 mmol/L (ref 98–111)
Creatinine, Ser: 0.68 mg/dL (ref 0.44–1.00)
GFR, Estimated: >60 mL/min (ref >60)
Glucose, Bld: 98 mg/dL (ref 70–99)
Potassium: 3.3 mmol/L — ABNORMAL LOW (ref 3.5–5.1)
Sodium: 139 mmol/L (ref 135–145)

## 2021-12-01 LAB — URINALYSIS, MICROSCOPIC (REFLEX)

## 2021-12-01 LAB — TROPONIN I (HIGH SENSITIVITY): Troponin I (High Sensitivity): <2 ng/L (ref <18)

## 2021-12-01 LAB — PREGNANCY, URINE: Preg Test, Ur: NEGATIVE

## 2021-12-01 MED ORDER — POTASSIUM CHLORIDE CRYS ER 20 MEQ PO TBCR
40.0000 meq | EXTENDED_RELEASE_TABLET | Freq: Once | ORAL | Status: AC
  Administered 2021-12-01: 40 meq via ORAL
  Filled 2021-12-01: qty 2

## 2021-12-01 NOTE — ED Triage Notes (Signed)
Pt started with nausea, vomiting, and diarrhea last week. Was seen and given fluids then. For the last 3 days her heartrate has been up and she feels dizzy. ?

## 2021-12-01 NOTE — ED Provider Notes (Signed)
? ?[facility] ?Provider Note: Georgena Spurling, MD, [place] ? ?CSN: [id number] ?MRN: [id number] ?ARRIVAL: 12/01/21 at [place] ?ROOM: MH05/MH05 ? ? ?CHIEF COMPLAINT  ?Tachycardia ? ? ?HISTORY OF PRESENT ILLNESS  ?12/01/21 1:51 AM ?Stefanie Lucero is a 31 y.o. female who had nausea, vomiting and diarrhea last week.  She was seen for this and given IV fluids.  She also has a history of sinus tachycardia.  Since her recent illness she has had episodes of palpitations (rapid heartbeat) associated with diaphoresis, shortness of breath and a burning sensation in her chest (different than her chronic eosinophilic esophagitis).  These episodes tend to last 30 to 60 minutes.  Nothing makes them better or worse.  She had an episode yesterday afternoon at work and another episode yesterday evening about 11 PM that awakened her from sleep.  She is currently asymptomatic.  Her heart rate is now in the 70s. ? ?TSH on 11/10/2021 was 2.41 (within normal limits). ? ?She has an appointment with her PCP the day after tomorrow and has an appointment with a cardiologist on Dec 14, 2021. ? ?Past Medical History:  ?Diagnosis Date  ? Anxiety   ? Asthma   ? Eosinophilic esophagitis   ? Gastric ulcer   ? Gastritis   ? Headaches, cluster   ? Migraine   ? Pancreatic mass   ? STD (sexually transmitted disease)   ? trichomonas treated 2020  ? ? ?Past Surgical History:  ?Procedure Laterality Date  ? CESAREAN SECTION    ? x 2  ? ESOPHAGEAL MANOMETRY N/A 10/07/2021  ? Procedure: ESOPHAGEAL MANOMETRY (EM);  Surgeon: Otis Brace, MD;  Location: [facility];  Service: Gastroenterology;  Laterality: N/A;  ? INTRAUTERINE DEVICE (IUD) INSERTION    ? mirena inserted 12-07-2019  ? [place] IMPEDANCE STUDY  10/07/2021  ? Procedure: [place] IMPEDANCE STUDY;  Surgeon: Otis Brace, MD;  Location: [facility];  Service: Gastroenterology;;  ? TONSILLECTOMY  2001  ? ? ?Family History  ?Problem Relation Age of Onset  ? Hypertension Mother   ? Stroke Father   ?  Heart attack Father   ?     times 2  ? Hypertension Father   ? Dementia Maternal Grandmother   ? Alzheimer's disease Maternal Grandmother   ? Irregular heart beat Maternal Grandmother   ? Stroke Maternal Grandfather   ? Gallbladder disease Maternal Grandfather   ? Colon cancer Paternal Grandfather 37  ?        ? ? ?Social History  ? ?Tobacco Use  ? Smoking status: Never  ? Smokeless tobacco: Never  ?Vaping Use  ? Vaping Use: Never used  ?Substance Use Topics  ? Alcohol use: Yes  ?  Comment: occ  ? Drug use: No  ? ? ?Prior to Admission medications   ?Medication Sig Start Date End Date Taking? Authorizing Provider  ?acetaminophen (TYLENOL) 500 MG tablet Take 1,000 mg by mouth every 6 (six) hours as needed for moderate pain or headache.    [provider]  ?albuterol (VENTOLIN HFA) 108 (90 Base) MCG/ACT inhaler Inhale 2 puffs into the lungs every 4 (four) hours as needed for wheezing or shortness of breath. 11/06/20   [provider]  ?diclofenac Sodium (VOLTAREN) 1 % GEL Apply 2 g topically 4 (four) times daily as needed. 06/16/21   Long, Wonda Olds, MD  ?famotidine (PEPCID) 20 MG tablet Take 1 tablet (20 mg total) by mouth 2 (two) times daily. 09/14/21   Palumbo, April, MD  ?  FLUoxetine (PROZAC) 10 MG capsule Take 10 mg by mouth daily. 11/09/21   [provider]  ?gabapentin (NEURONTIN) 100 MG capsule Take 100 mg by mouth 3 (three) times daily as needed. 11/09/21   [provider]  ?Galcanezumab-gnlm (EMGALITY) 120 MG/ML SOAJ Inject 120 mg into the skin every 28 (twenty-eight) days. 11/24/21   Pieter Partridge, DO  ?lansoprazole (PREVACID) 30 MG capsule Take 1 capsule (30 mg total) by mouth 2 (two) times daily before a meal. 05/25/21 08/23/21  Lynden Oxford Scales, PA-C  ?norelgestromin-ethinyl estradiol Marilu Favre) 150-35 MCG/24HR transdermal patch Place 1 patch onto the skin once a week. 10/01/21   Tamela Gammon, NP  ?ondansetron (ZOFRAN ODT) 4 MG disintegrating tablet Take 1 tablet (4 mg  total) by mouth every 8 (eight) hours as needed for nausea or vomiting. 11/22/21   Raylene Everts, MD  ?pantoprazole (PROTONIX) 20 MG tablet Take 1 tablet (20 mg total) by mouth daily. 11/25/21 12/25/21  Mickie Hillier, PA-C  ?etonogestrel-ethinyl estradiol (NUVARING) 0.12-0.015 MG/24HR vaginal ring Place 1 each vaginally every 28 (twenty-eight) days. Insert vaginally and leave in place for 3 consecutive weeks, then remove for 1 week. ?Patient not taking: No sig reported 10/02/20 11/12/20  Tamela Gammon, NP  ? ? ?Allergies ?Eggs or egg-derived products, Nutmeg oil (myristica oil), Cefdinir, Ceftin [cefuroxime], Guggulipid-black pepper, Iodine, and Nickel ? ? ?REVIEW OF SYSTEMS  ?Negative except as noted here or in the History of Present Illness. ? ? ?PHYSICAL EXAMINATION  ?Initial Vital Signs ?Blood pressure (!) 127/94, pulse (!) 103, temperature 98 ?F (36.7 ?C), temperature source Oral, resp. rate (!) 21, height 5\' 3"  (1.6 m), weight 83.9 kg, last menstrual period 11/12/2021, SpO2 99 %. ? ?Examination ?General: Well-developed, well-nourished female in no acute distress; appearance consistent with age of record ?HENT: normocephalic; atraumatic ?Eyes: pupils equal, round and reactive to light; extraocular muscles intact ?Neck: supple ?Heart: regular rate and rhythm ?Lungs: clear to auscultation bilaterally ?Abdomen: soft; nondistended; nontender; bowel sounds present ?Extremities: No deformity; full range of motion; pulses normal ?Neurologic: Awake, alert and oriented; motor function intact in all extremities and symmetric; no facial droop ?Skin: Warm and dry ?Psychiatric: Normal mood and affect ? ? ?RESULTS  ?Summary of this visit's results, reviewed and interpreted by myself: ? ? EKG Interpretation ? ?Date/Time:  Tuesday Dec 01 2021 01:00:29 EDT ?Ventricular Rate:  101 ?PR Interval:  148 ?QRS Duration: 90 ?QT Interval:  352 ?QTC Calculation: 457 ?R Axis:   40 ?Text Interpretation: Sinus tachycardia Low voltage,  precordial leads No significant change was found Confirmed by Tamicka Shimon [phone]) on 12/01/2021 1:05:35 AM ?  ? ?  ? ?Laboratory Studies: ?Results for orders placed or performed during the hospital encounter of 12/01/21 (from the past 24 hour(s))  ?Pregnancy, urine     Status: None  ? Collection Time: 12/01/21  1:00 AM  ?Result Value Ref Range  ? Preg Test, Ur NEGATIVE NEGATIVE  ?Urinalysis, Routine w reflex microscopic Urine, Clean Catch     Status: Abnormal  ? Collection Time: 12/01/21  1:00 AM  ?Result Value Ref Range  ? Color, Urine YELLOW YELLOW  ? APPearance HAZY (A) CLEAR  ? Specific Gravity, Urine 1.015 1.005 - 1.030  ? pH 7.0 5.0 - 8.0  ? Glucose, UA NEGATIVE NEGATIVE mg/dL  ? Hgb urine dipstick MODERATE (A) NEGATIVE  ? Bilirubin Urine NEGATIVE NEGATIVE  ? Ketones, ur NEGATIVE NEGATIVE mg/dL  ? Protein, ur NEGATIVE NEGATIVE mg/dL  ? Nitrite NEGATIVE  NEGATIVE  ? Leukocytes,Ua NEGATIVE NEGATIVE  ?Urinalysis, Microscopic (reflex)     Status: Abnormal  ? Collection Time: 12/01/21  1:00 AM  ?Result Value Ref Range  ? RBC / HPF 0-5 0 - 5 RBC/hpf  ? WBC, UA 0-5 0 - 5 WBC/hpf  ? Bacteria, UA RARE (A) NONE SEEN  ? Squamous Epithelial / LPF 0-5 0 - 5  ? Amorphous Crystal PRESENT   ?Rapid urine drug screen (hospital performed)     Status: None  ? Collection Time: 12/01/21  1:00 AM  ?Result Value Ref Range  ? Opiates NONE DETECTED NONE DETECTED  ? Cocaine NONE DETECTED NONE DETECTED  ? Benzodiazepines NONE DETECTED NONE DETECTED  ? Amphetamines NONE DETECTED NONE DETECTED  ? Tetrahydrocannabinol NONE DETECTED NONE DETECTED  ? Barbiturates NONE DETECTED NONE DETECTED  ?Basic metabolic panel     Status: Abnormal  ? Collection Time: 12/01/21  1:04 AM  ?Result Value Ref Range  ? Sodium 139 135 - 145 mmol/L  ? Potassium 3.3 (L) 3.5 - 5.1 mmol/L  ? Chloride 106 98 - 111 mmol/L  ? CO2 26 22 - 32 mmol/L  ? Glucose, Bld 98 70 - 99 mg/dL  ? BUN 11 6 - 20 mg/dL  ? Creatinine, Ser 0.68 0.44 - 1.00 mg/dL  ? Calcium 9.1 8.9 - 10.3  mg/dL  ? GFR, Estimated >60 >60 mL/min  ? Anion gap 7 5 - 15  ?CBC     Status: None  ? Collection Time: 12/01/21  1:04 AM  ?Result Value Ref Range  ? WBC 9.0 4.0 - 10.5 K/uL  ? RBC 4.55 3.87 - 5.11 MIL/uL

## 2021-12-03 DIAGNOSIS — G894 Chronic pain syndrome: Secondary | ICD-10-CM | POA: Diagnosis not present

## 2021-12-03 DIAGNOSIS — F411 Generalized anxiety disorder: Secondary | ICD-10-CM | POA: Diagnosis not present

## 2021-12-04 DIAGNOSIS — E559 Vitamin D deficiency, unspecified: Secondary | ICD-10-CM | POA: Diagnosis not present

## 2021-12-07 DIAGNOSIS — R0789 Other chest pain: Secondary | ICD-10-CM | POA: Diagnosis not present

## 2021-12-07 DIAGNOSIS — K200 Eosinophilic esophagitis: Secondary | ICD-10-CM | POA: Diagnosis not present

## 2021-12-07 DIAGNOSIS — K5900 Constipation, unspecified: Secondary | ICD-10-CM | POA: Diagnosis not present

## 2021-12-07 DIAGNOSIS — Z8711 Personal history of peptic ulcer disease: Secondary | ICD-10-CM | POA: Diagnosis not present

## 2021-12-08 ENCOUNTER — Emergency Department (HOSPITAL_BASED_OUTPATIENT_CLINIC_OR_DEPARTMENT_OTHER)
Admission: EM | Admit: 2021-12-08 | Discharge: 2021-12-08 | Disposition: A | Discharge status: Home / Self Care | Payer: BC Managed Care – PPO | Attending: Emergency Medicine | Admitting: Emergency Medicine

## 2021-12-08 ENCOUNTER — Encounter (HOSPITAL_BASED_OUTPATIENT_CLINIC_OR_DEPARTMENT_OTHER): Payer: Self-pay | Admitting: Emergency Medicine

## 2021-12-08 ENCOUNTER — Other Ambulatory Visit: Payer: Self-pay

## 2021-12-08 DIAGNOSIS — R9431 Abnormal electrocardiogram [ECG] [EKG]: Secondary | ICD-10-CM | POA: Diagnosis not present

## 2021-12-08 DIAGNOSIS — R42 Dizziness and giddiness: Secondary | ICD-10-CM | POA: Insufficient documentation

## 2021-12-08 DIAGNOSIS — Z3202 Encounter for pregnancy test, result negative: Secondary | ICD-10-CM | POA: Diagnosis not present

## 2021-12-08 DIAGNOSIS — M549 Dorsalgia, unspecified: Secondary | ICD-10-CM | POA: Insufficient documentation

## 2021-12-08 DIAGNOSIS — J45909 Unspecified asthma, uncomplicated: Secondary | ICD-10-CM | POA: Insufficient documentation

## 2021-12-08 DIAGNOSIS — R079 Chest pain, unspecified: Secondary | ICD-10-CM | POA: Diagnosis not present

## 2021-12-08 DIAGNOSIS — R002 Palpitations: Secondary | ICD-10-CM | POA: Diagnosis not present

## 2021-12-08 DIAGNOSIS — R0789 Other chest pain: Secondary | ICD-10-CM | POA: Diagnosis not present

## 2021-12-08 DIAGNOSIS — M545 Low back pain, unspecified: Secondary | ICD-10-CM | POA: Diagnosis not present

## 2021-12-08 DIAGNOSIS — R55 Syncope and collapse: Secondary | ICD-10-CM | POA: Diagnosis not present

## 2021-12-08 HISTORY — DX: Dizziness and giddiness: R42

## 2021-12-08 MED ORDER — DIAZEPAM 5 MG PO TABS: 2.5000 mg | ORAL_TABLET | Freq: Four times a day (QID) | ORAL | 0 refills | Status: DC | PRN

## 2021-12-08 NOTE — ED Triage Notes (Signed)
Patient arrived via POV c/o upper back pain and vertigo. Patient denies palpitations at this time. Patient states vertigo x 3 days, taking her meds with no relief. Patient is AO x 4, VS WDL, normal gait. ?

## 2021-12-08 NOTE — ED Provider Notes (Signed)
? ?[facility] ?Provider Note: Stefanie Spurling, MD, [place] ? ?CSN: [id number] ?MRN: [id number] ?ARRIVAL: 12/08/21 at 0119 ?ROOM: MH08/MH08 ? ? ?CHIEF COMPLAINT  ?Dizziness ? ? ?HISTORY OF PRESENT ILLNESS  ?12/08/21 3:26 AM ?Stefanie Lucero is a 31 y.o. female with a history of vertigo.  She is here with vertigo for the past 4 days.  She has taken meclizine which was prescribed in the past but it has not helped.  She states meclizine has never helped her vertigo.  She describes her vertigo as a sensation that the room is spinning and that she is off balance when walking.  It is worse with movement of her head.  She is having associated tinnitus.  The tinnitus of either ear is worse when she lies with that ear downward.  She is not having nausea or vomiting with this but this may be due to her chronic use of Zofran for her esophagitis and GERD.  She is having some back pain but she attributes this to her chronic esophagitis.  She denies any chest pain or palpitations. ? ? ?Past Medical History:  ?Diagnosis Date  ? Anxiety   ? Asthma   ? Eosinophilic esophagitis   ? Gastric ulcer   ? Gastritis   ? Headaches, cluster   ? Migraine   ? Pancreatic mass   ? STD (sexually transmitted disease)   ? trichomonas treated 2020  ? Vertigo   ? ? ?Past Surgical History:  ?Procedure Laterality Date  ? CESAREAN SECTION    ? x 2  ? ESOPHAGEAL MANOMETRY N/A 10/07/2021  ? Procedure: ESOPHAGEAL MANOMETRY (EM);  Surgeon: Otis Brace, MD;  Location: [facility];  Service: Gastroenterology;  Laterality: N/A;  ? INTRAUTERINE DEVICE (IUD) INSERTION    ? mirena inserted 12-07-2019  ? [place] IMPEDANCE STUDY  10/07/2021  ? Procedure: [place] IMPEDANCE STUDY;  Surgeon: Otis Brace, MD;  Location: [facility];  Service: Gastroenterology;;  ? TONSILLECTOMY  2001  ? ? ?Family History  ?Problem Relation Age of Onset  ? Hypertension Mother   ? Stroke Father   ? Heart attack Father   ?     times 2  ? Hypertension Father   ? Dementia Maternal  Grandmother   ? Alzheimer's disease Maternal Grandmother   ? Irregular heart beat Maternal Grandmother   ? Stroke Maternal Grandfather   ? Gallbladder disease Maternal Grandfather   ? Colon cancer Paternal Grandfather 59  ?        ? ? ?Social History  ? ?Tobacco Use  ? Smoking status: Never  ? Smokeless tobacco: Never  ?Vaping Use  ? Vaping Use: Never used  ?Substance Use Topics  ? Alcohol use: Yes  ?  Comment: occ  ? Drug use: No  ? ? ?Prior to Admission medications   ?Medication Sig Start Date End Date Taking? Authorizing Provider  ?diazepam (VALIUM) 5 MG tablet Take 0.5-1 tablets (2.5-5 mg total) by mouth every 6 (six) hours as needed (for vertigo). 12/08/21  Yes Anaya Bovee, MD  ?acetaminophen (TYLENOL) 500 MG tablet Take 1,000 mg by mouth every 6 (six) hours as needed for moderate pain or headache.    [provider]  ?albuterol (VENTOLIN HFA) 108 (90 Base) MCG/ACT inhaler Inhale 2 puffs into the lungs every 4 (four) hours as needed for wheezing or shortness of breath. 11/06/20   [provider]  ?diclofenac Sodium (VOLTAREN) 1 % GEL Apply 2 g topically 4 (four) times daily as needed. 06/16/21  Long, Wonda Olds, MD  ?famotidine (PEPCID) 20 MG tablet Take 1 tablet (20 mg total) by mouth 2 (two) times daily. 09/14/21   Palumbo, April, MD  ?FLUoxetine (PROZAC) 10 MG capsule Take 10 mg by mouth daily. 11/09/21   [provider]  ?gabapentin (NEURONTIN) 100 MG capsule Take 100 mg by mouth 3 (three) times daily as needed. 11/09/21   [provider]  ?Galcanezumab-gnlm (EMGALITY) 120 MG/ML SOAJ Inject 120 mg into the skin every 28 (twenty-eight) days. 11/24/21   Pieter Partridge, DO  ?lansoprazole (PREVACID) 30 MG capsule Take 1 capsule (30 mg total) by mouth 2 (two) times daily before a meal. 05/25/21 08/23/21  Lynden Oxford Scales, PA-C  ?norelgestromin-ethinyl estradiol Marilu Favre) 150-35 MCG/24HR transdermal patch Place 1 patch onto the skin once a week. 10/01/21   Tamela Gammon, NP   ?ondansetron (ZOFRAN ODT) 4 MG disintegrating tablet Take 1 tablet (4 mg total) by mouth every 8 (eight) hours as needed for nausea or vomiting. 11/22/21   Raylene Everts, MD  ?pantoprazole (PROTONIX) 20 MG tablet Take 1 tablet (20 mg total) by mouth daily. 11/25/21 12/25/21  Mickie Hillier, PA-C  ?etonogestrel-ethinyl estradiol (NUVARING) 0.12-0.015 MG/24HR vaginal ring Place 1 each vaginally every 28 (twenty-eight) days. Insert vaginally and leave in place for 3 consecutive weeks, then remove for 1 week. ?Patient not taking: No sig reported 10/02/20 11/12/20  Tamela Gammon, NP  ? ? ?Allergies ?Eggs or egg-derived products, Nutmeg oil (myristica oil), Cefdinir, Ceftin [cefuroxime], Guggulipid-black pepper, Iodine, and Nickel ? ? ?REVIEW OF SYSTEMS  ?Negative except as noted here or in the History of Present Illness. ? ? ?PHYSICAL EXAMINATION  ?Initial Vital Signs ?Blood pressure (!) 136/93, pulse 86, temperature 97.8 ?F (36.6 ?C), temperature source Oral, resp. rate 20, height 5\' 3"  (1.6 m), weight 83 kg, last menstrual period 11/12/2021, SpO2 100 %. ? ?Examination ?General: Well-developed, well-nourished female in no acute distress; appearance consistent with age of record ?HENT: normocephalic; atraumatic; TMs normal ?Eyes: pupils equal, round and reactive to light; extraocular muscles intact B: No nystagmus ?Neck: supple ?Heart: regular rate and rhythm ?Lungs: clear to auscultation bilaterally ?Abdomen: soft; nondistended; nontender; bowel sounds present ?Extremities: No deformity; full range of motion ?Neurologic: Awake, alert and oriented; motor function intact in all extremities and symmetric; no facial droop; normal coordination, speech and gait ?Skin: Warm and dry ?Psychiatric: Normal mood and affect ? ? ?RESULTS  ?Summary of this visit's results, reviewed and interpreted by myself: ? ? EKG Interpretation ? ?Date/Time:    ?Ventricular Rate:    ?PR Interval:    ?QRS Duration:   ?QT Interval:    ?QTC  Calculation:   ?R Axis:     ?Text Interpretation:   ?  ? ?  ? ?Laboratory Studies: ?No results found for this or any previous visit (from the past 24 hour(s)). ?Imaging Studies: ?No results found. ? ?ED COURSE and MDM  ?Nursing notes, initial and subsequent vitals signs, including pulse oximetry, reviewed and interpreted by myself. ? ?Vitals:  ? 12/08/21 0131 12/08/21 0132  ?BP: (!) 136/93   ?Pulse: 86   ?Resp: 20   ?Temp: 97.8 ?F (36.6 ?C)   ?TempSrc: Oral   ?SpO2: 100%   ?Weight:  83 kg  ?Height:  5\' 3"  (1.6 m)  ? ?Medications - No data to display ? ?We will trial Valium as this often works for vertigo when meclizine fails.  We will refer to ear nose throat given her tinnitus and  vertigo as she may have some underlying inner ear pathology. ? ?PROCEDURES  ?Procedures ? ? ?ED DIAGNOSES  ? ?  ICD-10-CM   ?1. Vertigo  R42   ?  ? ? ? ?  ?Shanon Rosser, MD ?12/08/21 [phone] ? ?

## 2021-12-11 DIAGNOSIS — G894 Chronic pain syndrome: Secondary | ICD-10-CM | POA: Diagnosis not present

## 2021-12-11 DIAGNOSIS — M79641 Pain in right hand: Secondary | ICD-10-CM | POA: Diagnosis not present

## 2021-12-11 DIAGNOSIS — R202 Paresthesia of skin: Secondary | ICD-10-CM | POA: Diagnosis not present

## 2021-12-11 DIAGNOSIS — M79642 Pain in left hand: Secondary | ICD-10-CM | POA: Diagnosis not present

## 2021-12-14 DIAGNOSIS — M545 Low back pain, unspecified: Secondary | ICD-10-CM | POA: Diagnosis not present

## 2021-12-14 DIAGNOSIS — M940 Chondrocostal junction syndrome [Tietze]: Secondary | ICD-10-CM | POA: Diagnosis not present

## 2021-12-14 DIAGNOSIS — G894 Chronic pain syndrome: Secondary | ICD-10-CM | POA: Diagnosis not present

## 2021-12-14 DIAGNOSIS — M25519 Pain in unspecified shoulder: Secondary | ICD-10-CM | POA: Diagnosis not present

## 2021-12-14 DIAGNOSIS — R55 Syncope and collapse: Secondary | ICD-10-CM | POA: Diagnosis not present

## 2021-12-14 DIAGNOSIS — R42 Dizziness and giddiness: Secondary | ICD-10-CM | POA: Diagnosis not present

## 2021-12-14 DIAGNOSIS — M542 Cervicalgia: Secondary | ICD-10-CM | POA: Diagnosis not present

## 2021-12-14 DIAGNOSIS — R682 Dry mouth, unspecified: Secondary | ICD-10-CM | POA: Diagnosis not present

## 2021-12-14 DIAGNOSIS — M797 Fibromyalgia: Secondary | ICD-10-CM | POA: Diagnosis not present

## 2021-12-14 DIAGNOSIS — R0609 Other forms of dyspnea: Secondary | ICD-10-CM | POA: Diagnosis not present

## 2021-12-14 DIAGNOSIS — G43909 Migraine, unspecified, not intractable, without status migrainosus: Secondary | ICD-10-CM | POA: Diagnosis not present

## 2021-12-14 DIAGNOSIS — R0789 Other chest pain: Secondary | ICD-10-CM | POA: Diagnosis not present

## 2021-12-14 DIAGNOSIS — K200 Eosinophilic esophagitis: Secondary | ICD-10-CM | POA: Diagnosis not present

## 2021-12-14 DIAGNOSIS — R0681 Apnea, not elsewhere classified: Secondary | ICD-10-CM | POA: Diagnosis not present

## 2021-12-14 DIAGNOSIS — Z8249 Family history of ischemic heart disease and other diseases of the circulatory system: Secondary | ICD-10-CM | POA: Diagnosis not present

## 2021-12-14 DIAGNOSIS — R002 Palpitations: Secondary | ICD-10-CM | POA: Diagnosis not present

## 2021-12-14 DIAGNOSIS — G8929 Other chronic pain: Secondary | ICD-10-CM | POA: Diagnosis not present

## 2021-12-14 DIAGNOSIS — R5383 Other fatigue: Secondary | ICD-10-CM | POA: Diagnosis not present

## 2021-12-14 DIAGNOSIS — R202 Paresthesia of skin: Secondary | ICD-10-CM | POA: Diagnosis not present

## 2021-12-21 ENCOUNTER — Other Ambulatory Visit: Payer: Self-pay

## 2021-12-21 ENCOUNTER — Encounter (HOSPITAL_BASED_OUTPATIENT_CLINIC_OR_DEPARTMENT_OTHER): Payer: Self-pay

## 2021-12-21 ENCOUNTER — Emergency Department (HOSPITAL_BASED_OUTPATIENT_CLINIC_OR_DEPARTMENT_OTHER)
Admission: EM | Admit: 2021-12-21 | Discharge: 2021-12-21 | Disposition: A | Discharge status: Home / Self Care | Payer: BC Managed Care – PPO | Attending: Emergency Medicine | Admitting: Emergency Medicine

## 2021-12-21 DIAGNOSIS — Z91040 Latex allergy status: Secondary | ICD-10-CM | POA: Insufficient documentation

## 2021-12-21 DIAGNOSIS — R110 Nausea: Secondary | ICD-10-CM | POA: Diagnosis not present

## 2021-12-21 DIAGNOSIS — G8929 Other chronic pain: Secondary | ICD-10-CM | POA: Insufficient documentation

## 2021-12-21 DIAGNOSIS — R0789 Other chest pain: Secondary | ICD-10-CM | POA: Diagnosis not present

## 2021-12-21 MED ORDER — KETOROLAC TROMETHAMINE 30 MG/ML IJ SOLN
30.0000 mg | Freq: Once | INTRAMUSCULAR | Status: AC
  Administered 2021-12-21: 30 mg via INTRAMUSCULAR
  Filled 2021-12-21: qty 1

## 2021-12-21 NOTE — ED Provider Notes (Signed)
[facility]  Provider Note  CSN: [id number] Arrival date & time: 12/21/21 0221  History Chief Complaint  Patient presents with   Chest Pain    Stefanie Lucero is a 31 y.o. female with history of chronic chest pain, fibromyalgia, eosinophilic esophagitis and anxiety with frequent ED visits for a variety of complaints including 18 visits this year alone is here for left sided chest wall pain onset about 12 hours ago, associated with nausea which was improved with her home Zofran. No other acute symptoms, no fever, cough or SOB. Similar to previous. She is followed by cardiology and has had extensive workup with them, planned for stress test soon.    Home Medications Prior to Admission medications   Medication Sig Start Date End Date Taking? Authorizing Provider  acetaminophen (TYLENOL) 500 MG tablet Take 1,000 mg by mouth every 6 (six) hours as needed for moderate pain or headache.    [provider]  albuterol (VENTOLIN HFA) 108 (90 Base) MCG/ACT inhaler Inhale 2 puffs into the lungs every 4 (four) hours as needed for wheezing or shortness of breath. 11/06/20   [provider]  diazepam (VALIUM) 5 MG tablet Take 0.5-1 tablets (2.5-5 mg total) by mouth every 6 (six) hours as needed (for vertigo). 12/08/21   Molpus, John, MD  diclofenac Sodium (VOLTAREN) 1 % GEL Apply 2 g topically 4 (four) times daily as needed. 06/16/21   Long, Wonda Olds, MD  famotidine (PEPCID) 20 MG tablet Take 1 tablet (20 mg total) by mouth 2 (two) times daily. 09/14/21   Palumbo, April, MD  FLUoxetine (PROZAC) 10 MG capsule Take 10 mg by mouth daily. 11/09/21   [provider]  gabapentin (NEURONTIN) 100 MG capsule Take 100 mg by mouth 3 (three) times daily as needed. 11/09/21   [provider]  Galcanezumab-gnlm (EMGALITY) 120 MG/ML SOAJ Inject 120 mg into the skin every 28 (twenty-eight) days. 11/24/21   Pieter Partridge, DO  lansoprazole (PREVACID) 30 MG capsule  Take 1 capsule (30 mg total) by mouth 2 (two) times daily before a meal. 05/25/21 08/23/21  Lynden Oxford Scales, PA-C  norelgestromin-ethinyl estradiol Marilu Favre) 150-35 MCG/24HR transdermal patch Place 1 patch onto the skin once a week. 10/01/21   Tamela Gammon, NP  ondansetron (ZOFRAN ODT) 4 MG disintegrating tablet Take 1 tablet (4 mg total) by mouth every 8 (eight) hours as needed for nausea or vomiting. 11/22/21   Raylene Everts, MD  pantoprazole (PROTONIX) 20 MG tablet Take 1 tablet (20 mg total) by mouth daily. 11/25/21 12/25/21  Mickie Hillier, PA-C  etonogestrel-ethinyl estradiol (NUVARING) 0.12-0.015 MG/24HR vaginal ring Place 1 each vaginally every 28 (twenty-eight) days. Insert vaginally and leave in place for 3 consecutive weeks, then remove for 1 week. Patient not taking: No sig reported 10/02/20 11/12/20  Tamela Gammon, NP     Allergies    Eggs or egg-derived products, Latex, Nutmeg oil (myristica oil), Cefdinir, Cefuroxime, Guggulipid-black pepper, Iodine, Nickel, and Other   Review of Systems   Review of Systems Please see HPI for pertinent positives and negatives  Physical Exam BP (!) 141/89   Pulse 91   Temp 98.4 F (36.9 C)   Resp 11   Ht 5\' 3"  (1.6 m)   Wt 83 kg   SpO2 100%   BMI 32.42 kg/m   Physical Exam Vitals and nursing note reviewed.  Constitutional:      Appearance: Normal appearance.  HENT:  Head: Normocephalic and atraumatic.     Nose: Nose normal.     Mouth/Throat:     Mouth: Mucous membranes are moist.  Eyes:     Extraocular Movements: Extraocular movements intact.     Conjunctiva/sclera: Conjunctivae normal.  Cardiovascular:     Rate and Rhythm: Normal rate.  Pulmonary:     Effort: Pulmonary effort is normal.     Breath sounds: Normal breath sounds.  Chest:     Chest wall: Tenderness (L lower chest, reproduces symptoms) present.  Abdominal:     General: Abdomen is flat.     Palpations: Abdomen is soft.     Tenderness: There  is no abdominal tenderness.  Musculoskeletal:        General: No swelling. Normal range of motion.     Cervical back: Neck supple.  Skin:    General: Skin is warm and dry.  Neurological:     General: No focal deficit present.     Mental Status: She is alert.  Psychiatric:        Mood and Affect: Mood normal.    ED Results / Procedures / Treatments   EKG EKG Interpretation  Date/Time:  Monday Dec 21 2021 02:33:05 EDT Ventricular Rate:  90 PR Interval:  161 QRS Duration: 66 QT Interval:  335 QTC Calculation: 410 R Axis:   12 Text Interpretation: Sinus rhythm No significant change since last tracing Confirmed by Calvert Cantor [phone]) on 12/21/2021 2:34:46 AM  Procedures Procedures  Medications Ordered in the ED Medications  ketorolac (TORADOL) 30 MG/ML injection 30 mg (has no administration in time range)    Initial Impression and Plan  Patient with exacerbation of chronic chest pain. Similar to previous ED visits, has had multiple normal workups. EKG is unchanged, no indication for further ED evaluation. Requesting Toradol for pain, plan discharge with PCP and Cards follow up as directed.   ED Course       MDM Rules/Calculators/A&P Medical Decision Making Problems Addressed: Chronic chest wall pain: chronic illness or injury with exacerbation, progression, or side effects of treatment  Amount and/or Complexity of Data Reviewed ECG/medicine tests: ordered and independent interpretation performed. Decision-making details documented in ED Course.  Risk Prescription drug management.    Final Clinical Impression(s) / ED Diagnoses Final diagnoses:  Chronic chest wall pain    Rx / DC Orders ED Discharge Orders     None        Truddie Hidden, MD 12/21/21 [phone]

## 2021-12-21 NOTE — ED Triage Notes (Signed)
C/o ongoing left chest/ left breast pain. Pain radiates to the back. Pt sts it is similar to pain she experienced during last couple ER visits. Hx of fibromyalgia.

## 2021-12-21 NOTE — ED Notes (Signed)
Pt verbalizes understanding of discharge instructions. Opportunity for questioning and answers were provided. Pt discharged from ED to home.   ? ?

## 2021-12-22 DIAGNOSIS — M542 Cervicalgia: Secondary | ICD-10-CM | POA: Diagnosis not present

## 2021-12-22 DIAGNOSIS — R202 Paresthesia of skin: Secondary | ICD-10-CM | POA: Diagnosis not present

## 2021-12-22 DIAGNOSIS — G894 Chronic pain syndrome: Secondary | ICD-10-CM | POA: Diagnosis not present

## 2021-12-23 ENCOUNTER — Ambulatory Visit
Admission: RE | Admit: 2021-12-23 | Discharge: 2021-12-23 | Disposition: A | Discharge status: Home / Self Care | Payer: BC Managed Care – PPO | Source: Ambulatory Visit | Attending: Urgent Care | Admitting: Urgent Care

## 2021-12-23 VITALS — BP 98/71 | HR 109 | Temp 99.5°F

## 2021-12-23 DIAGNOSIS — M25519 Pain in unspecified shoulder: Secondary | ICD-10-CM | POA: Diagnosis not present

## 2021-12-23 DIAGNOSIS — A084 Viral intestinal infection, unspecified: Secondary | ICD-10-CM | POA: Diagnosis not present

## 2021-12-23 DIAGNOSIS — R52 Pain, unspecified: Secondary | ICD-10-CM | POA: Diagnosis not present

## 2021-12-23 DIAGNOSIS — K5900 Constipation, unspecified: Secondary | ICD-10-CM | POA: Diagnosis not present

## 2021-12-23 DIAGNOSIS — J309 Allergic rhinitis, unspecified: Secondary | ICD-10-CM | POA: Diagnosis not present

## 2021-12-23 DIAGNOSIS — R509 Fever, unspecified: Secondary | ICD-10-CM

## 2021-12-23 DIAGNOSIS — G8929 Other chronic pain: Secondary | ICD-10-CM | POA: Diagnosis not present

## 2021-12-23 DIAGNOSIS — R112 Nausea with vomiting, unspecified: Secondary | ICD-10-CM

## 2021-12-23 DIAGNOSIS — M542 Cervicalgia: Secondary | ICD-10-CM | POA: Diagnosis not present

## 2021-12-23 MED ORDER — CETIRIZINE HCL 10 MG PO TABS: 10.0000 mg | ORAL_TABLET | Freq: Every day | ORAL | 0 refills | Status: DC

## 2021-12-23 MED ORDER — PSEUDOEPHEDRINE HCL 30 MG PO TABS: 30.0000 mg | ORAL_TABLET | Freq: Three times a day (TID) | ORAL | 0 refills | Status: DC | PRN

## 2021-12-23 MED ORDER — ONDANSETRON HCL 4 MG/2ML IJ SOLN
4.0000 mg | Freq: Once | INTRAMUSCULAR | Status: AC
  Administered 2021-12-23: 4 mg via INTRAMUSCULAR

## 2021-12-23 MED ORDER — FLUTICASONE PROPIONATE 50 MCG/ACT NA SUSP: 2.0000 | Freq: Every day | NASAL | 12 refills | Status: DC

## 2021-12-23 MED ORDER — ONDANSETRON 8 MG PO TBDP: 8.0000 mg | ORAL_TABLET | Freq: Three times a day (TID) | ORAL | 0 refills | Status: DC | PRN

## 2021-12-23 NOTE — ED Notes (Signed)
Patient states 3 days was seen at ED for Chest pain,had started to feel "fatigue" after visit and not really eating.  Yesterday patient states she ate a full meal for the first time and then at 5pm patient states she started to vomit, states she has had multiple BM they are all solid and patient states she deals with constipation and also has left sided ear pain.   8/10 Pain

## 2021-12-23 NOTE — Discharge Instructions (Addendum)

## 2021-12-23 NOTE — ED Provider Notes (Signed)
[facility]   MRN: [id number] DOB: 1991-06-24  Subjective:   Stefanie Lucero is a 31 y.o. female presenting for 3-day history of acute onset persistent nausea with vomiting, upset stomach and abdominal pain.  Patient has a history of eosinophilic esophagitis, gastritis, gastric ulcers.  Has a GI specialist.  Last office visit with him was earlier this month.  She was also seen through the emergency room.  Different symptom set but had a complete work-up which was reviewed below.  Denies any recent antibiotic use, bloody stools.  She is also had bilateral ear fullness, right ear pain, sinus pain over the past few days.  Patient would like to be checked for COVID and flu just to make sure.  She does have follow-up set up with her GI doctor in a few weeks.  Last endoscopy done was about 6 months ago.  No current facility-administered medications for this encounter.  Current Outpatient Medications:    acetaminophen (TYLENOL) 500 MG tablet, Take 1,000 mg by mouth every 6 (six) hours as needed for moderate pain or headache., Disp: , Rfl:    albuterol (VENTOLIN HFA) 108 (90 Base) MCG/ACT inhaler, Inhale 2 puffs into the lungs every 4 (four) hours as needed for wheezing or shortness of breath., Disp: , Rfl:    diazepam (VALIUM) 5 MG tablet, Take 0.5-1 tablets (2.5-5 mg total) by mouth every 6 (six) hours as needed (for vertigo)., Disp: 20 tablet, Rfl: 0   diclofenac Sodium (VOLTAREN) 1 % GEL, Apply 2 g topically 4 (four) times daily as needed., Disp: 100 g, Rfl: 0   famotidine (PEPCID) 20 MG tablet, Take 1 tablet (20 mg total) by mouth 2 (two) times daily., Disp: 14 tablet, Rfl: 0   FLUoxetine (PROZAC) 10 MG capsule, Take 10 mg by mouth daily., Disp: , Rfl:    gabapentin (NEURONTIN) 100 MG capsule, Take 100 mg by mouth 3 (three) times daily as needed., Disp: , Rfl:    Galcanezumab-gnlm (EMGALITY) 120 MG/ML SOAJ, Inject 120 mg into the skin every 28 (twenty-eight) days., Disp: 1.12  mL, Rfl: 5   lansoprazole (PREVACID) 30 MG capsule, Take 1 capsule (30 mg total) by mouth 2 (two) times daily before a meal., Disp: 60 capsule, Rfl: 2   norelgestromin-ethinyl estradiol (XULANE) 150-35 MCG/24HR transdermal patch, Place 1 patch onto the skin once a week., Disp: 9 patch, Rfl: 3   ondansetron (ZOFRAN ODT) 4 MG disintegrating tablet, Take 1 tablet (4 mg total) by mouth every 8 (eight) hours as needed for nausea or vomiting., Disp: 20 tablet, Rfl: 0   pantoprazole (PROTONIX) 20 MG tablet, Take 1 tablet (20 mg total) by mouth daily., Disp: 30 tablet, Rfl: 0   Allergies  Allergen Reactions   Eggs Or Egg-Derived Products Anaphylaxis, Hives and Rash   Latex Itching    (Condoms)   Nutmeg Oil (Myristica Oil) Anaphylaxis and Rash   Cefdinir Hives   Cefuroxime Hives   Guggulipid-Black Pepper Hives    & White Pepper  & White Pepper    Iodine Other (See Comments) and Hives    Pt states "mouth itches"   Nickel Rash   Other Hives    & White Pepper  & White Pepper     Past Medical History:  Diagnosis Date   Anxiety    Asthma    Eosinophilic esophagitis    Gastric ulcer    Gastritis    Headaches, cluster    Migraine    Pancreatic mass  STD (sexually transmitted disease)    trichomonas treated 2020   Vertigo      Past Surgical History:  Procedure Laterality Date   CESAREAN SECTION     x 2   ESOPHAGEAL MANOMETRY N/A 10/07/2021   Procedure: ESOPHAGEAL MANOMETRY (EM);  Surgeon: Kathi Der, MD;  Location: [facility];  Service: Gastroenterology;  Laterality: N/A;   INTRAUTERINE DEVICE (IUD) INSERTION     mirena inserted 12-07-2019   PH IMPEDANCE STUDY  10/07/2021   Procedure: PH IMPEDANCE STUDY;  Surgeon: Kathi Der, MD;  Location: [facility];  Service: Gastroenterology;;   TONSILLECTOMY  2001    Family History  Problem Relation Age of Onset   Hypertension Mother    Stroke Father    Heart attack Father        times 2   Hypertension Father     Dementia Maternal Grandmother    Alzheimer's disease Maternal Grandmother    Irregular heart beat Maternal Grandmother    Stroke Maternal Grandfather    Gallbladder disease Maternal Grandfather    Colon cancer Paternal Grandfather 108            Social History   Tobacco Use   Smoking status: Never   Smokeless tobacco: Never  Vaping Use   Vaping Use: Never used  Substance Use Topics   Alcohol use: Yes    Comment: occ   Drug use: No    ROS   Objective:   Vitals: BP 98/71 (BP Location: Left Arm)   Pulse (!) 109   Temp 99.5 F (37.5 C)   SpO2 96%   Physical Exam Constitutional:      General: She is not in acute distress.    Appearance: Normal appearance. She is well-developed and normal weight. She is not ill-appearing, toxic-appearing or diaphoretic.  HENT:     Head: Normocephalic and atraumatic.     Right Ear: Tympanic membrane, ear canal and external ear normal. No drainage or tenderness. No middle ear effusion. There is no impacted cerumen. Tympanic membrane is not erythematous.     Left Ear: Tympanic membrane, ear canal and external ear normal. No drainage or tenderness.  No middle ear effusion. There is no impacted cerumen. Tympanic membrane is not erythematous.     Nose: Nose normal. No congestion or rhinorrhea.     Mouth/Throat:     Mouth: Mucous membranes are moist. No oral lesions.     Pharynx: No pharyngeal swelling, oropharyngeal exudate, posterior oropharyngeal erythema or uvula swelling.     Tonsils: No tonsillar exudate or tonsillar abscesses. 0 on the right. 0 on the left.  Eyes:     General: No scleral icterus.       Right eye: No discharge.        Left eye: No discharge.     Extraocular Movements: Extraocular movements intact.     Right eye: Normal extraocular motion.     Left eye: Normal extraocular motion.     Conjunctiva/sclera: Conjunctivae normal.  Cardiovascular:     Rate and Rhythm: Normal rate and regular rhythm.     Heart sounds: Normal  heart sounds. No murmur heard.   No friction rub. No gallop.  Pulmonary:     Effort: Pulmonary effort is normal. No respiratory distress.     Breath sounds: No stridor. No wheezing, rhonchi or rales.  Chest:     Chest wall: No tenderness.  Abdominal:     General: Bowel sounds are increased. There is no distension.  Palpations: Abdomen is soft. There is no mass.     Tenderness: There is generalized abdominal tenderness and tenderness in the epigastric area and periumbilical area. There is no right CVA tenderness, left CVA tenderness, guarding or rebound.  Musculoskeletal:     Cervical back: Normal range of motion and neck supple.  Lymphadenopathy:     Cervical: No cervical adenopathy.  Skin:    General: Skin is warm and dry.  Neurological:     General: No focal deficit present.     Mental Status: She is alert and oriented to person, place, and time.  Psychiatric:        Mood and Affect: Mood normal.        Behavior: Behavior normal.        Thought Content: Thought content normal.        Judgment: Judgment normal.    Recent Results (from the past 2160 hour(s))  Cytology - PAP( [place])     Status: None   Collection Time: 10/01/21 11:45 AM  Result Value Ref Range   Chlamydia Negative    Neisseria Gonorrhea Negative    Adequacy      Satisfactory for evaluation; transformation zone component PRESENT.   Diagnosis      - Negative for intraepithelial lesion or malignancy (NILM)   Comment Normal Reference Ranger Chlamydia - Negative    Comment      Normal Reference Range Neisseria Gonorrhea - Negative  WET PREP FOR TRICH, YEAST, CLUE     Status: None   Collection Time: 10/01/21 11:55 AM   Specimen: Vaginal Fluid  Result Value Ref Range   Source: VAGINA    RESULT      Comment: EPITHELIAL CELLS-PRESENT CLUE CELLS-NONE SEEN YEAST-NONE SEEN TRICHOMONAS-NONE SEEN WBC-FEW BACTERIA-FEW EPITH. CELLS <6 HPF   Basic metabolic panel     Status: Abnormal   Collection Time:  10/18/21  2:04 PM  Result Value Ref Range   Sodium 138 135 - 145 mmol/L   Potassium 3.7 3.5 - 5.1 mmol/L   Chloride 107 98 - 111 mmol/L   CO2 24 22 - 32 mmol/L   Glucose, Bld 102 (H) 70 - 99 mg/dL    Comment: Glucose reference range applies only to samples taken after fasting for at least 8 hours.   BUN 9 6 - 20 mg/dL   Creatinine, Ser 5.46 0.44 - 1.00 mg/dL   Calcium 9.2 8.9 - 50.3 mg/dL   GFR, Estimated >54 >65 mL/min    Comment: (NOTE) Calculated using the CKD-EPI Creatinine Equation (2021)    Anion gap 7 5 - 15    Comment: Performed at [facility], [address] [place], [place] [address]  CBC     Status: None   Collection Time: 10/18/21  2:04 PM  Result Value Ref Range   WBC 8.5 4.0 - 10.5 K/uL   RBC 4.80 3.87 - 5.11 MIL/uL   Hemoglobin 13.3 12.0 - 15.0 g/dL   HCT 51.7 00.1 - 74.9 %   MCV 84.2 80.0 - 100.0 fL   MCH 27.7 26.0 - 34.0 pg   MCHC 32.9 30.0 - 36.0 g/dL   RDW 44.9 67.5 - 91.6 %   Platelets 322 150 - 400 K/uL   nRBC 0.0 0.0 - 0.2 %    Comment: Performed at [facility], [address] [place], [place] [address]  Troponin I (High Sensitivity)     Status: None   Collection Time: 10/18/21  2:04 PM  Result Value Ref Range   Troponin I (High Sensitivity) <2 <18 ng/L    Comment: (NOTE) Elevated high sensitivity troponin I (hsTnI) values and significant  changes across serial measurements may suggest ACS but many other  chronic and acute conditions are known to elevate hsTnI results.  Refer to the "Links" section for chest pain algorithms and additional  guidance. Performed at [facility], [address] [place], [place] [address]   Pregnancy, urine     Status: None   Collection Time: 10/18/21  3:00 PM  Result Value Ref Range   Preg Test, Ur NEGATIVE NEGATIVE    Comment:        THE SENSITIVITY OF THIS METHODOLOGY IS >20 mIU/mL. Performed at [facility], [address] [place], [place] [address]   Lipase,  blood     Status: None   Collection Time: 11/25/21  4:06 PM  Result Value Ref Range   Lipase 28 11 - 51 U/L    Comment: Performed at [facility], [address] [place], [place] [address]  Comprehensive metabolic panel     Status: Abnormal   Collection Time: 11/25/21  4:06 PM  Result Value Ref Range   Sodium 141 135 - 145 mmol/L   Potassium 3.8 3.5 - 5.1 mmol/L   Chloride 109 98 - 111 mmol/L   CO2 24 22 - 32 mmol/L   Glucose, Bld 113 (H) 70 - 99 mg/dL    Comment: Glucose reference range applies only to samples taken after fasting for at least 8 hours.   BUN 6 6 - 20 mg/dL   Creatinine, Ser 9.14 0.44 - 1.00 mg/dL   Calcium 8.8 (L) 8.9 - 10.3 mg/dL   Total Protein 7.4 6.5 - 8.1 g/dL   Albumin 4.2 3.5 - 5.0 g/dL   AST 18 15 - 41 U/L   ALT 17 0 - 44 U/L   Alkaline Phosphatase 57 38 - 126 U/L   Total Bilirubin 0.5 0.3 - 1.2 mg/dL   GFR, Estimated >78 >29 mL/min    Comment: (NOTE) Calculated using the CKD-EPI Creatinine Equation (2021)    Anion gap 8 5 - 15    Comment: Performed at [facility], [address] [place], [place] [address]  CBC     Status: None   Collection Time: 11/25/21  4:06 PM  Result Value Ref Range   WBC 7.7 4.0 - 10.5 K/uL   RBC 4.43 3.87 - 5.11 MIL/uL   Hemoglobin 12.2 12.0 - 15.0 g/dL   HCT 08.6 57.8 - 46.9 %   MCV 86.7 80.0 - 100.0 fL   MCH 27.5 26.0 - 34.0 pg   MCHC 31.8 30.0 - 36.0 g/dL   RDW 62.9 52.8 - 41.3 %   Platelets 287 150 - 400 K/uL   nRBC 0.0 0.0 - 0.2 %    Comment: Performed at [facility], [address] [place], [place] [address]  Urinalysis, Routine w reflex microscopic Urine, Clean Catch     Status: Abnormal   Collection Time: 11/25/21  4:07 PM  Result Value Ref Range   Color, Urine YELLOW YELLOW   APPearance CLEAR CLEAR   Specific Gravity, Urine 1.020 1.005 - 1.030   pH 7.0 5.0 - 8.0   Glucose, UA NEGATIVE NEGATIVE mg/dL   Hgb urine dipstick SMALL (A) NEGATIVE   Bilirubin Urine NEGATIVE  NEGATIVE   Ketones, ur  NEGATIVE NEGATIVE mg/dL   Protein, ur NEGATIVE NEGATIVE mg/dL   Nitrite NEGATIVE NEGATIVE   Leukocytes,Ua NEGATIVE NEGATIVE    Comment: Performed at [facility], [address] [place], [place] [address]  Pregnancy, urine     Status: None   Collection Time: 11/25/21  4:07 PM  Result Value Ref Range   Preg Test, Ur NEGATIVE NEGATIVE    Comment:        THE SENSITIVITY OF THIS METHODOLOGY IS >20 mIU/mL. Performed at [facility] [place], [address] [place], [place] [address]   Urinalysis, Microscopic (reflex)     Status: Abnormal   Collection Time: 11/25/21  4:07 PM  Result Value Ref Range   RBC / HPF 6-10 0 - 5 RBC/hpf   WBC, UA 0-5 0 - 5 WBC/hpf   Bacteria, UA RARE (A) NONE SEEN   Squamous Epithelial / LPF 0-5 0 - 5    Comment: Performed at [facility] [place], [address] [place], [place] [address]  Pregnancy, urine     Status: None   Collection Time: 12/01/21  1:00 AM  Result Value Ref Range   Preg Test, Ur NEGATIVE NEGATIVE    Comment:        THE SENSITIVITY OF THIS METHODOLOGY IS >20 mIU/mL. Performed at [facility] [place], [address] [place], [place] [address]   Urinalysis, Routine w reflex microscopic Urine, Clean Catch     Status: Abnormal   Collection Time: 12/01/21  1:00 AM  Result Value Ref Range   Color, Urine YELLOW YELLOW   APPearance HAZY (A) CLEAR   Specific Gravity, Urine 1.015 1.005 - 1.030   pH 7.0 5.0 - 8.0   Glucose, UA NEGATIVE NEGATIVE mg/dL   Hgb urine dipstick MODERATE (A) NEGATIVE   Bilirubin Urine NEGATIVE NEGATIVE   Ketones, ur NEGATIVE NEGATIVE mg/dL   Protein, ur NEGATIVE NEGATIVE mg/dL   Nitrite NEGATIVE NEGATIVE   Leukocytes,Ua NEGATIVE NEGATIVE    Comment: Performed at [facility] [place], [address] [place], [place] [address]  Urinalysis, Microscopic (reflex)     Status: Abnormal   Collection Time: 12/01/21  1:00 AM  Result Value Ref Range   RBC / HPF 0-5 0 - 5  RBC/hpf   WBC, UA 0-5 0 - 5 WBC/hpf   Bacteria, UA RARE (A) NONE SEEN   Squamous Epithelial / LPF 0-5 0 - 5   Amorphous Crystal PRESENT     Comment: Performed at [facility] [place], [address] [place], [place] [address]  Rapid urine drug screen (hospital performed)     Status: None   Collection Time: 12/01/21  1:00 AM  Result Value Ref Range   Opiates NONE DETECTED NONE DETECTED   Cocaine NONE DETECTED NONE DETECTED   Benzodiazepines NONE DETECTED NONE DETECTED   Amphetamines NONE DETECTED NONE DETECTED   Tetrahydrocannabinol NONE DETECTED NONE DETECTED   Barbiturates NONE DETECTED NONE DETECTED    Comment: (NOTE) DRUG SCREEN FOR MEDICAL PURPOSES ONLY.  IF CONFIRMATION IS NEEDED FOR ANY PURPOSE, NOTIFY LAB WITHIN 5 DAYS.  LOWEST DETECTABLE LIMITS FOR URINE DRUG SCREEN Drug Class                     Cutoff (ng/mL) Amphetamine and metabolites    1000 Barbiturate and metabolites    200 Benzodiazepine  200 Tricyclics and metabolites     300 Opiates and metabolites        300 Cocaine and metabolites        300 THC                            50 Performed at [facility], [address] [place], [place] [address]   Basic metabolic panel     Status: Abnormal   Collection Time: 12/01/21  1:04 AM  Result Value Ref Range   Sodium 139 135 - 145 mmol/L   Potassium 3.3 (L) 3.5 - 5.1 mmol/L   Chloride 106 98 - 111 mmol/L   CO2 26 22 - 32 mmol/L   Glucose, Bld 98 70 - 99 mg/dL    Comment: Glucose reference range applies only to samples taken after fasting for at least 8 hours.   BUN 11 6 - 20 mg/dL   Creatinine, Ser 6.04 0.44 - 1.00 mg/dL   Calcium 9.1 8.9 - 54.0 mg/dL   GFR, Estimated >98 >11 mL/min    Comment: (NOTE) Calculated using the CKD-EPI Creatinine Equation (2021)    Anion gap 7 5 - 15    Comment: Performed at [facility], [address] [place], [place] [address]  CBC     Status: None   Collection Time: 12/01/21  1:04  AM  Result Value Ref Range   WBC 9.0 4.0 - 10.5 K/uL   RBC 4.55 3.87 - 5.11 MIL/uL   Hemoglobin 12.6 12.0 - 15.0 g/dL   HCT 29.5 62.1 - 30.8 %   MCV 85.7 80.0 - 100.0 fL   MCH 27.7 26.0 - 34.0 pg   MCHC 32.3 30.0 - 36.0 g/dL   RDW 65.7 84.6 - 96.2 %   Platelets 239 150 - 400 K/uL   nRBC 0.0 0.0 - 0.2 %    Comment: Performed at [facility], [address] [place], [place] [address]  Troponin I (High Sensitivity)     Status: None   Collection Time: 12/01/21  1:04 AM  Result Value Ref Range   Troponin I (High Sensitivity) <2 <18 ng/L    Comment: (NOTE) Elevated high sensitivity troponin I (hsTnI) values and significant  changes across serial measurements may suggest ACS but many other  chronic and acute conditions are known to elevate hsTnI results.  Refer to the "Links" section for chest pain algorithms and additional  guidance. Performed at [facility], [address] [place], [place] [address]    CT Abdomen Pelvis Wo Contrast  Result Date: 11/25/2021 CLINICAL DATA:  Acute abdominal pain. EXAM: CT ABDOMEN AND PELVIS WITHOUT CONTRAST TECHNIQUE: Multidetector CT imaging of the abdomen and pelvis was performed following the standard protocol without IV contrast. RADIATION DOSE REDUCTION: This exam was performed according to the departmental dose-optimization program which includes automated exposure control, adjustment of the mA and/or kV according to patient size and/or use of iterative reconstruction technique. COMPARISON:  Ultrasound abdomen 11/25/2021. CT renal stone 09/14/2021. FINDINGS: Lower chest: No acute abnormality. Hepatobiliary: No focal liver abnormality is seen. No gallstones, gallbladder wall thickening, or biliary dilatation. Pancreas: Unremarkable. No pancreatic ductal dilatation or surrounding inflammatory changes. Spleen: Normal in size without focal abnormality. Adrenals/Urinary Tract: Adrenal glands are unremarkable. Kidneys are normal, without  renal calculi, focal lesion, or hydronephrosis. Bladder is unremarkable. Stomach/Bowel: Stomach is within normal limits. Appendix appears normal.  No evidence of bowel wall thickening, distention, or inflammatory changes. Vascular/Lymphatic: No significant vascular findings are present. No enlarged abdominal or pelvic lymph nodes. Reproductive: There is a 2 cm cyst in the right ovary. Left ovary and uterus are within normal limits. Other: Very small fat containing umbilical hernia.  No ascites. Musculoskeletal: No acute or significant osseous findings. IMPRESSION: 1. 2 cm right ovarian simple-appearing cyst. No follow-up imaging is recommended. Reference: JACR 2020 Feb;17(2):248-254 2. No other acute abnormality identified. Electronically Signed   By: Darliss Cheney M.D.   On: 11/25/2021 20:06   DG Chest 2 View  Result Date: 12/01/2021 CLINICAL DATA:  Chest tightness EXAM: CHEST - 2 VIEW COMPARISON:  10/18/2021 FINDINGS: The heart size and mediastinal contours are within normal limits. Both lungs are clear. The visualized skeletal structures are unremarkable. IMPRESSION: Normal study. Electronically Signed   By: Charlett Nose M.D.   On: 12/01/2021 01:26   DG Chest 2 View  Result Date: 10/18/2021 CLINICAL DATA:  Chest pain EXAM: CHEST - 2 VIEW COMPARISON:  Chest x-ray 06/15/2021 FINDINGS: Heart size and mediastinal contours are within normal limits. No suspicious pulmonary opacities identified. No pleural effusion or pneumothorax visualized. No acute osseous abnormality appreciated. IMPRESSION: No acute intrathoracic process identified. Electronically Signed   By: Jannifer Hick M.D.   On: 10/18/2021 14:35   CT Head Wo Contrast  Result Date: 10/18/2021 CLINICAL DATA:  Presyncope, left arm tingling EXAM: CT HEAD WITHOUT CONTRAST TECHNIQUE: Contiguous axial images were obtained from the base of the skull through the vertex without intravenous contrast. RADIATION DOSE REDUCTION: This exam was performed  according to the departmental dose-optimization program which includes automated exposure control, adjustment of the mA and/or kV according to patient size and/or use of iterative reconstruction technique. COMPARISON:  08/01/2021, 03/08/2021 FINDINGS: Brain: No acute infarct or hemorrhage. Lateral ventricles and midline structures are unremarkable. No acute extra-axial fluid collections. No mass effect. Vascular: No hyperdense vessel or unexpected calcification. Skull: Normal. Negative for fracture or focal lesion. Sinuses/Orbits: No acute finding. Other: None. IMPRESSION: 1. No acute intracranial process. Electronically Signed   By: Sharlet Salina M.D.   On: 10/18/2021 15:27   US Abdomen Limited RUQ (LIVER/GB)  Result Date: 11/25/2021 CLINICAL DATA:  Right upper quadrant with nausea vomiting EXAM: ULTRASOUND ABDOMEN LIMITED RIGHT UPPER QUADRANT COMPARISON:  CT abdomen pelvis 09/14/2021 FINDINGS: Gallbladder: No gallstones or wall thickening visualized. No sonographic Murphy sign noted by sonographer. Common bile duct: Diameter: 2.6 mm Liver: No focal lesion identified. Within normal limits in parenchymal echogenicity. Portal vein is patent on color Doppler imaging with normal direction of blood flow towards the liver. Other: None. IMPRESSION: Negative right upper quadrant ultrasound. Electronically Signed   By: Marlan Palau M.D.   On: 11/25/2021 18:00    Patient given IM Zofran in clinic for her nausea and vomiting.  Assessment and Plan :   PDMP not reviewed this encounter.  1. Viral gastroenteritis   2. Nausea and vomiting, unspecified vomiting type   3. Constipation, unspecified constipation type   4. Fever, unspecified   5. Body aches   6. Allergic rhinitis, unspecified seasonality, unspecified trigger    Recommended against an ER visit for now she just had a CT scan.  Emphasized need for follow-up with her GI doctor soon as possible.  No signs of an acute abdomen currently.  Recommended  managing for viral gastroenteritis with supportive care.  No sign of bowel obstruction as patient had a bowel movement this morning.  She does have a difficult time with constipation as well but again do not believe that this is related to when she is currently experiencing symptoms once.  Otherwise, recommended starting Flonase, Zyrtec, pseudoephedrine for her upper respiratory symptoms. Counseled patient on potential for adverse effects with medications prescribed/recommended today, ER and return-to-clinic precautions discussed, patient verbalized understanding.    Wallis Bamberg, [place] 12/23/21 1111

## 2021-12-23 NOTE — ED Triage Notes (Signed)
Patient states 3 days was seen at ED for Chest pain,had started to feel "fatigue" after visit and not really eating.  Yesterday patient states she ate a full meal for the first time and then at 5pm patient states she started to vomit, states she has had multiple BM they are all solid and patient states she deals with constipation and also has left sided ear pain.    8/10 Pain   Took tylenol an hour ago

## 2021-12-25 LAB — COVID-19, FLU A+B NAA
Influenza A, NAA: NOT DETECTED
Influenza B, NAA: NOT DETECTED
SARS-CoV-2, NAA: NOT DETECTED

## 2021-12-29 DIAGNOSIS — R0600 Dyspnea, unspecified: Secondary | ICD-10-CM | POA: Diagnosis not present

## 2021-12-29 DIAGNOSIS — R079 Chest pain, unspecified: Secondary | ICD-10-CM | POA: Diagnosis not present

## 2021-12-29 DIAGNOSIS — R5383 Other fatigue: Secondary | ICD-10-CM | POA: Diagnosis not present

## 2021-12-31 DIAGNOSIS — R202 Paresthesia of skin: Secondary | ICD-10-CM | POA: Diagnosis not present

## 2021-12-31 DIAGNOSIS — F411 Generalized anxiety disorder: Secondary | ICD-10-CM | POA: Diagnosis not present

## 2021-12-31 DIAGNOSIS — G894 Chronic pain syndrome: Secondary | ICD-10-CM | POA: Diagnosis not present

## 2021-12-31 DIAGNOSIS — R5383 Other fatigue: Secondary | ICD-10-CM | POA: Diagnosis not present

## 2021-12-31 DIAGNOSIS — E559 Vitamin D deficiency, unspecified: Secondary | ICD-10-CM | POA: Diagnosis not present

## 2022-01-18 ENCOUNTER — Ambulatory Visit: Admit: 2022-01-18 | Discharge status: Against Medical Advice | Payer: BC Managed Care – PPO

## 2022-01-18 DIAGNOSIS — K2100 Gastro-esophageal reflux disease with esophagitis, without bleeding: Secondary | ICD-10-CM | POA: Diagnosis not present

## 2022-01-21 DIAGNOSIS — R197 Diarrhea, unspecified: Secondary | ICD-10-CM | POA: Diagnosis not present

## 2022-01-21 DIAGNOSIS — R109 Unspecified abdominal pain: Secondary | ICD-10-CM | POA: Diagnosis not present

## 2022-01-21 DIAGNOSIS — R1031 Right lower quadrant pain: Secondary | ICD-10-CM | POA: Diagnosis not present

## 2022-01-27 DIAGNOSIS — R0600 Dyspnea, unspecified: Secondary | ICD-10-CM | POA: Diagnosis not present

## 2022-01-27 DIAGNOSIS — N61 Mastitis without abscess: Secondary | ICD-10-CM | POA: Diagnosis not present

## 2022-01-31 ENCOUNTER — Emergency Department (HOSPITAL_BASED_OUTPATIENT_CLINIC_OR_DEPARTMENT_OTHER): Payer: BC Managed Care – PPO

## 2022-01-31 ENCOUNTER — Emergency Department (HOSPITAL_BASED_OUTPATIENT_CLINIC_OR_DEPARTMENT_OTHER)
Admission: EM | Admit: 2022-01-31 | Discharge: 2022-01-31 | Disposition: A | Discharge status: Home / Self Care | Payer: BC Managed Care – PPO | Attending: Emergency Medicine | Admitting: Emergency Medicine

## 2022-01-31 ENCOUNTER — Other Ambulatory Visit: Payer: Self-pay

## 2022-01-31 ENCOUNTER — Encounter (HOSPITAL_BASED_OUTPATIENT_CLINIC_OR_DEPARTMENT_OTHER): Payer: Self-pay | Admitting: Emergency Medicine

## 2022-01-31 DIAGNOSIS — Z79899 Other long term (current) drug therapy: Secondary | ICD-10-CM | POA: Insufficient documentation

## 2022-01-31 DIAGNOSIS — J45909 Unspecified asthma, uncomplicated: Secondary | ICD-10-CM | POA: Insufficient documentation

## 2022-01-31 DIAGNOSIS — K5900 Constipation, unspecified: Secondary | ICD-10-CM | POA: Diagnosis not present

## 2022-01-31 DIAGNOSIS — R1031 Right lower quadrant pain: Secondary | ICD-10-CM | POA: Diagnosis not present

## 2022-01-31 DIAGNOSIS — Z91040 Latex allergy status: Secondary | ICD-10-CM | POA: Insufficient documentation

## 2022-01-31 DIAGNOSIS — R109 Unspecified abdominal pain: Secondary | ICD-10-CM | POA: Diagnosis not present

## 2022-01-31 LAB — URINALYSIS, MICROSCOPIC (REFLEX)

## 2022-01-31 LAB — CBC WITH DIFFERENTIAL/PLATELET
Abs Immature Granulocytes: 0.03 10*3/uL (ref 0.00–0.07)
Basophils Absolute: 0 10*3/uL (ref 0.0–0.1)
Basophils Relative: 0 %
Eosinophils Absolute: 0.2 10*3/uL (ref 0.0–0.5)
Eosinophils Relative: 2 %
HCT: 39 % (ref 36.0–46.0)
Hemoglobin: 12.6 g/dL (ref 12.0–15.0)
Immature Granulocytes: 0 %
Lymphocytes Relative: 35 %
Lymphs Abs: 3.5 10*3/uL (ref 0.7–4.0)
MCH: 27 pg (ref 26.0–34.0)
MCHC: 32.3 g/dL (ref 30.0–36.0)
MCV: 83.7 fL (ref 80.0–100.0)
Monocytes Absolute: 0.8 10*3/uL (ref 0.1–1.0)
Monocytes Relative: 8 %
Neutro Abs: 5.5 10*3/uL (ref 1.7–7.7)
Neutrophils Relative %: 55 %
Platelets: 297 10*3/uL (ref 150–400)
RBC: 4.66 MIL/uL (ref 3.87–5.11)
RDW: 13.9 % (ref 11.5–15.5)
WBC: 10.1 10*3/uL (ref 4.0–10.5)
nRBC: 0 % (ref 0.0–0.2)

## 2022-01-31 LAB — COMPREHENSIVE METABOLIC PANEL
ALT: 18 U/L (ref 0–44)
AST: 19 U/L (ref 15–41)
Albumin: 4.2 g/dL (ref 3.5–5.0)
Alkaline Phosphatase: 59 U/L (ref 38–126)
Anion gap: 7 (ref 5–15)
BUN: 12 mg/dL (ref 6–20)
CO2: 25 mmol/L (ref 22–32)
Calcium: 10 mg/dL (ref 8.9–10.3)
Chloride: 106 mmol/L (ref 98–111)
Creatinine, Ser: 0.73 mg/dL (ref 0.44–1.00)
GFR, Estimated: >60 mL/min (ref >60)
Glucose, Bld: 104 mg/dL — ABNORMAL HIGH (ref 70–99)
Potassium: 3.4 mmol/L — ABNORMAL LOW (ref 3.5–5.1)
Sodium: 138 mmol/L (ref 135–145)
Total Bilirubin: 0.5 mg/dL (ref 0.3–1.2)
Total Protein: 7.4 g/dL (ref 6.5–8.1)

## 2022-01-31 LAB — URINALYSIS, ROUTINE W REFLEX MICROSCOPIC
Bilirubin Urine: NEGATIVE
Glucose, UA: NEGATIVE mg/dL
Ketones, ur: NEGATIVE mg/dL
Leukocytes,Ua: NEGATIVE
Nitrite: NEGATIVE
Protein, ur: NEGATIVE mg/dL
Specific Gravity, Urine: 1.015 (ref 1.005–1.030)
pH: 7 (ref 5.0–8.0)

## 2022-01-31 LAB — RAPID URINE DRUG SCREEN, HOSP PERFORMED
Amphetamines: NOT DETECTED
Barbiturates: NOT DETECTED
Benzodiazepines: NOT DETECTED
Cocaine: NOT DETECTED
Opiates: NOT DETECTED
Tetrahydrocannabinol: NOT DETECTED

## 2022-01-31 LAB — LIPASE, BLOOD: Lipase: 31 U/L (ref 11–51)

## 2022-01-31 LAB — PREGNANCY, URINE: Preg Test, Ur: NEGATIVE

## 2022-01-31 MED ORDER — PANTOPRAZOLE SODIUM 40 MG IV SOLR
40.0000 mg | Freq: Once | INTRAVENOUS | Status: DC
  Filled 2022-01-31: qty 10

## 2022-01-31 MED ORDER — FENTANYL CITRATE PF 50 MCG/ML IJ SOSY
100.0000 ug | PREFILLED_SYRINGE | Freq: Once | INTRAMUSCULAR | Status: AC
  Administered 2022-01-31: 100 ug via INTRAVENOUS
  Filled 2022-01-31: qty 2

## 2022-01-31 MED ORDER — METOCLOPRAMIDE HCL 5 MG/ML IJ SOLN
10.0000 mg | Freq: Once | INTRAMUSCULAR | Status: AC
  Administered 2022-01-31: 10 mg via INTRAVENOUS
  Filled 2022-01-31: qty 2

## 2022-01-31 MED ORDER — IOHEXOL 300 MG/ML  SOLN: 100.0000 mL | Freq: Once | INTRAMUSCULAR | Status: DC | PRN

## 2022-01-31 MED ORDER — POLYETHYLENE GLYCOL 3350 17 G PO PACK: 17.0000 g | PACK | Freq: Two times a day (BID) | ORAL | 0 refills | Status: DC

## 2022-01-31 MED ORDER — DICYCLOMINE HCL 20 MG PO TABS: 20.0000 mg | ORAL_TABLET | Freq: Two times a day (BID) | ORAL | 0 refills | Status: DC

## 2022-01-31 MED ORDER — BISACODYL 10 MG RE SUPP
10.0000 mg | RECTAL | 0 refills | Status: DC | PRN
Start: 2022-01-31 — End: 2022-02-25

## 2022-01-31 MED ORDER — SODIUM CHLORIDE 0.9 % IV BOLUS
1000.0000 mL | Freq: Once | INTRAVENOUS | Status: AC
  Administered 2022-01-31: 1000 mL via INTRAVENOUS

## 2022-01-31 MED ORDER — ONDANSETRON HCL 4 MG/2ML IJ SOLN
4.0000 mg | Freq: Once | INTRAMUSCULAR | Status: AC
Start: 2022-01-31 — End: 2022-01-31
  Administered 2022-01-31: 4 mg via INTRAVENOUS
  Filled 2022-01-31: qty 2

## 2022-01-31 NOTE — ED Provider Notes (Signed)
[facility] Provider Note: Lowella Dell, MD, FACEP  CSN: [id number] MRN: [id number] ARRIVAL: 01/31/22 at 0427 ROOM: [facility]07/[facility]07   CHIEF COMPLAINT  Abdominal Pain   HISTORY OF PRESENT ILLNESS  01/31/22 4:43 AM Stefanie Lucero is a 31 y.o. female with a history of eosinophilic esophagitis.  She is here with several days of suprapubic and right lower quadrant abdominal pain with associated nausea and vomiting.  She rates the pain as an 8 out of 10, worse with movement or palpation.  It is aching and sharp in nature.  She denies diarrhea.  She was worked up for kidney stone on 01/21/2022 due to right flank pain and difficulty urinating but her CT scan was negative.  She continues to have difficulty starting her stream but is not having dysuria or hematuria.  She denies vaginal bleeding or discharge.  She is also having pain up into her chest but this may be due to her eosinophilic esophagitis.  She has also had a cough with shortness of breath recently which may be unrelated to her abdominal pain.  She has been able to control her vomiting with Zofran but has not been able to control the pain.  Over the past 24 hours she has had a fever as high as 102.1.   Past Medical History:  Diagnosis Date   Anxiety    Asthma    Eosinophilic esophagitis    Gastric ulcer    Gastritis    Headaches, cluster    Migraine    Pancreatic mass    Vertigo     Past Surgical History:  Procedure Laterality Date   CESAREAN SECTION     x 2   ESOPHAGEAL MANOMETRY N/A 10/07/2021   Procedure: ESOPHAGEAL MANOMETRY (EM);  Surgeon: Kathi Der, MD;  Location: [facility];  Service: Gastroenterology;  Laterality: N/A;   INTRAUTERINE DEVICE (IUD) INSERTION     mirena inserted 12-07-2019   PH IMPEDANCE STUDY  10/07/2021   Procedure: PH IMPEDANCE STUDY;  Surgeon: Kathi Der, MD;  Location: [facility];  Service: Gastroenterology;;   TONSILLECTOMY  2001    Family History  Problem Relation Age  of Onset   Hypertension Mother    Stroke Father    Heart attack Father        times 2   Hypertension Father    Dementia Maternal Grandmother    Alzheimer's disease Maternal Grandmother    Irregular heart beat Maternal Grandmother    Stroke Maternal Grandfather    Gallbladder disease Maternal Grandfather    Colon cancer Paternal Grandfather 48            Social History   Tobacco Use   Smoking status: Never   Smokeless tobacco: Never  Vaping Use   Vaping Use: Never used  Substance Use Topics   Alcohol use: Yes    Comment: occ   Drug use: No    Prior to Admission medications   Medication Sig Start Date End Date Taking? Authorizing Provider  bisacodyl (DULCOLAX) 10 MG suppository Place 1 suppository (10 mg total) rectally as needed for moderate constipation. 01/31/22  Yes Linwood Dibbles, MD  dicyclomine (BENTYL) 20 MG tablet Take 1 tablet (20 mg total) by mouth 2 (two) times daily. 01/31/22  Yes Linwood Dibbles, MD  polyethylene glycol (MIRALAX) 17 g packet Take 17 g by mouth 2 (two) times daily. 01/31/22 03/02/22 Yes Linwood Dibbles, MD  acetaminophen (TYLENOL) 500 MG tablet Take 1,000 mg by mouth every 6 (six) hours  as needed for moderate pain or headache.    [provider]  albuterol (VENTOLIN HFA) 108 (90 Base) MCG/ACT inhaler Inhale 2 puffs into the lungs every 4 (four) hours as needed for wheezing or shortness of breath. 11/06/20   [provider]  cetirizine (ZYRTEC ALLERGY) 10 MG tablet Take 1 tablet (10 mg total) by mouth daily. 12/23/21   Wallis Bamberg, PA-C  diazepam (VALIUM) 5 MG tablet Take 0.5-1 tablets (2.5-5 mg total) by mouth every 6 (six) hours as needed (for vertigo). 12/08/21   Syeda Prickett, MD  diclofenac Sodium (VOLTAREN) 1 % GEL Apply 2 g topically 4 (four) times daily as needed. 06/16/21   Long, Arlyss Repress, MD  famotidine (PEPCID) 20 MG tablet Take 1 tablet (20 mg total) by mouth 2 (two) times daily. 09/14/21   Palumbo, April, MD  FLUoxetine (PROZAC) 10 MG capsule Take  10 mg by mouth daily. 11/09/21   [provider]  fluticasone (FLONASE) 50 MCG/ACT nasal spray Place 2 sprays into both nostrils daily. 12/23/21   Wallis Bamberg, PA-C  gabapentin (NEURONTIN) 100 MG capsule Take 100 mg by mouth 3 (three) times daily as needed. 11/09/21   [provider]  Galcanezumab-gnlm (EMGALITY) 120 MG/ML SOAJ Inject 120 mg into the skin every 28 (twenty-eight) days. 11/24/21   Drema Dallas, DO  lansoprazole (PREVACID) 30 MG capsule Take 1 capsule (30 mg total) by mouth 2 (two) times daily before a meal. 05/25/21 08/23/21  Theadora Rama Scales, PA-C  norelgestromin-ethinyl estradiol [facility]ico) 150-35 MCG/24HR transdermal patch Place 1 patch onto the skin once a week. 10/01/21   Olivia Mackie, NP  ondansetron (ZOFRAN-ODT) 8 MG disintegrating tablet Take 1 tablet (8 mg total) by mouth every 8 (eight) hours as needed for nausea or vomiting. 12/23/21   Wallis Bamberg, PA-C  pantoprazole (PROTONIX) 20 MG tablet Take 1 tablet (20 mg total) by mouth daily. 11/25/21 12/25/21  Cristopher Peru, PA-C  pseudoephedrine (SUDAFED) 30 MG tablet Take 1 tablet (30 mg total) by mouth every 8 (eight) hours as needed for congestion. 12/23/21   Wallis Bamberg, PA-C  etonogestrel-ethinyl estradiol (NUVARING) 0.12-0.015 MG/24HR vaginal ring Place 1 each vaginally every 28 (twenty-eight) days. Insert vaginally and leave in place for 3 consecutive weeks, then remove for 1 week. Patient not taking: No sig reported 10/02/20 11/12/20  [name], NP    Allergies Eggs or egg-derived products, Iodinated contrast media, Iodine, Latex, Nutmeg oil (myristica oil), Cefdinir, Cefuroxime, Guggulipid-black pepper, Nickel, and Other   REVIEW OF SYSTEMS  Negative except as noted here or in the History of Present Illness.   PHYSICAL EXAMINATION  Initial Vital Signs Blood pressure (!) 141/109, pulse (!) 102, temperature 98.4 F (36.9 C), temperature source Oral, resp. rate 20, weight 83 kg, last menstrual  period 01/10/2022, SpO2 100 %.  Examination General: Well-developed, well-nourished female in no acute distress; appearance consistent with age of record HENT: normocephalic; atraumatic Eyes: Normal appearance Neck: supple Heart: regular rate and rhythm; tachycardia Lungs: clear to auscultation bilaterally; coughing Abdomen: soft; nondistended; suprapubic and right lower quadrant tenderness; bowel sounds present Extremities: No deformity; full range of motion; pulses normal Neurologic: Awake, alert and oriented; motor function intact in all extremities and symmetric; no facial droop Skin: Warm and dry Psychiatric: Flat affect   RESULTS  Summary of this visit's results, reviewed and interpreted by myself:   EKG Interpretation  Date/Time:    Ventricular Rate:    PR Interval:    QRS Duration:  QT Interval:    QTC Calculation:   R Axis:     Text Interpretation:         Laboratory Studies: Results for orders placed or performed during the hospital encounter of 01/31/22 (from the past 24 hour(s))  CBC with Differential     Status: None   Collection Time: 01/31/22  4:57 AM  Result Value Ref Range   WBC 10.1 4.0 - 10.5 K/uL   RBC 4.66 3.87 - 5.11 MIL/uL   Hemoglobin 12.6 12.0 - 15.0 g/dL   HCT 16.139.0 09.636.0 - 04.546.0 %   MCV 83.7 80.0 - 100.0 fL   MCH 27.0 26.0 - 34.0 pg   MCHC 32.3 30.0 - 36.0 g/dL   RDW 40.913.9 81.111.5 - 91.415.5 %   Platelets 297 150 - 400 K/uL   nRBC 0.0 0.0 - 0.2 %   Neutrophils Relative % 55 %   Neutro Abs 5.5 1.7 - 7.7 K/uL   Lymphocytes Relative 35 %   Lymphs Abs 3.5 0.7 - 4.0 K/uL   Monocytes Relative 8 %   Monocytes Absolute 0.8 0.1 - 1.0 K/uL   Eosinophils Relative 2 %   Eosinophils Absolute 0.2 0.0 - 0.5 K/uL   Basophils Relative 0 %   Basophils Absolute 0.0 0.0 - 0.1 K/uL   Immature Granulocytes 0 %   Abs Immature Granulocytes 0.03 0.00 - 0.07 K/uL  Urinalysis, Routine w reflex microscopic Urine, Clean Catch     Status: Abnormal   Collection Time:  01/31/22  4:57 AM  Result Value Ref Range   Color, Urine YELLOW YELLOW   APPearance CLEAR CLEAR   Specific Gravity, Urine 1.015 1.005 - 1.030   pH 7.0 5.0 - 8.0   Glucose, UA NEGATIVE NEGATIVE mg/dL   Hgb urine dipstick SMALL (A) NEGATIVE   Bilirubin Urine NEGATIVE NEGATIVE   Ketones, ur NEGATIVE NEGATIVE mg/dL   Protein, ur NEGATIVE NEGATIVE mg/dL   Nitrite NEGATIVE NEGATIVE   Leukocytes,Ua NEGATIVE NEGATIVE  Comprehensive metabolic panel     Status: Abnormal   Collection Time: 01/31/22  4:57 AM  Result Value Ref Range   Sodium 138 135 - 145 mmol/L   Potassium 3.4 (L) 3.5 - 5.1 mmol/L   Chloride 106 98 - 111 mmol/L   CO2 25 22 - 32 mmol/L   Glucose, Bld 104 (H) 70 - 99 mg/dL   BUN 12 6 - 20 mg/dL   Creatinine, Ser 7.820.73 0.44 - 1.00 mg/dL   Calcium 95.610.0 8.9 - 21.310.3 mg/dL   Total Protein 7.4 6.5 - 8.1 g/dL   Albumin 4.2 3.5 - 5.0 g/dL   AST 19 15 - 41 U/L   ALT 18 0 - 44 U/L   Alkaline Phosphatase 59 38 - 126 U/L   Total Bilirubin 0.5 0.3 - 1.2 mg/dL   GFR, Estimated >08>60 >65>60 mL/min   Anion gap 7 5 - 15  Pregnancy, urine     Status: None   Collection Time: 01/31/22  4:57 AM  Result Value Ref Range   Preg Test, Ur NEGATIVE NEGATIVE  Lipase, blood     Status: None   Collection Time: 01/31/22  4:57 AM  Result Value Ref Range   Lipase 31 11 - 51 U/L  Rapid urine drug screen (hospital performed)     Status: None   Collection Time: 01/31/22  4:57 AM  Result Value Ref Range   Opiates NONE DETECTED NONE DETECTED   Cocaine NONE DETECTED NONE DETECTED   Benzodiazepines NONE  DETECTED NONE DETECTED   Amphetamines NONE DETECTED NONE DETECTED   Tetrahydrocannabinol NONE DETECTED NONE DETECTED   Barbiturates NONE DETECTED NONE DETECTED  Urinalysis, Microscopic (reflex)     Status: Abnormal   Collection Time: 01/31/22  4:57 AM  Result Value Ref Range   RBC / HPF 0-5 0 - 5 RBC/hpf   WBC, UA 0-5 0 - 5 WBC/hpf   Bacteria, UA RARE (A) NONE SEEN   Squamous Epithelial / LPF 0-5 0 - 5    Non Squamous Epithelial PRESENT (A) NONE SEEN   Imaging Studies: CT ABDOMEN PELVIS WO CONTRAST  Result Date: 01/31/2022 CLINICAL DATA:  Right lower quadrant pain. EXAM: CT ABDOMEN AND PELVIS WITHOUT CONTRAST TECHNIQUE: Multidetector CT imaging of the abdomen and pelvis was performed following the standard protocol without IV contrast. RADIATION DOSE REDUCTION: This exam was performed according to the departmental dose-optimization program which includes automated exposure control, adjustment of the mA and/or kV according to patient size and/or use of iterative reconstruction technique. COMPARISON:  11/25/2021 FINDINGS: Lower chest: No acute abnormality. Hepatobiliary: No focal liver abnormality is seen. No gallstones, gallbladder wall thickening, or biliary dilatation. Pancreas: Unremarkable. No pancreatic ductal dilatation or surrounding inflammatory changes. Spleen: Normal in size without focal abnormality. Adrenals/Urinary Tract: Normal adrenal glands. No nephrolithiasis or hydronephrosis. No hydroureter or ureteral lithiasis. Bladder normal. Stomach/Bowel: Stomach appears normal. The appendix is visualized and appears within normal limits, image 65/2. There is no pathologic dilatation of the large or small bowel loops. Moderate retained desiccated stool identified within the distal colon and rectum. Vascular/Lymphatic: No significant vascular findings are present. No enlarged abdominal or pelvic lymph nodes. Reproductive: Uterus and adnexal structures are unremarkable. Simple appearing cyst within the right ovary measures 1.9 cm. Other: There is no free fluid or fluid collections identified within the abdomen or pelvis. No signs of pneumoperitoneum. Musculoskeletal: No acute or significant osseous findings. IMPRESSION: 1. No acute findings within the abdomen or pelvis. 2. The appendix is visualized and appears within normal limits. 3. Moderate retained desiccated stool identified within the distal colon and  rectum. Correlate for any clinical signs or symptoms of constipation. Electronically Signed   By: Signa Kell M.D.   On: 01/31/2022 09:00    ED COURSE and MDM  Nursing notes, initial and subsequent vitals signs, including pulse oximetry, reviewed and interpreted by myself.  Vitals:   01/31/22 0615 01/31/22 0721 01/31/22 0830 01/31/22 0930  BP: 107/76 116/76 92/60 (!) 96/57  Pulse: 62 79 83 86  Resp: 18 16 18 18   Temp:      TempSrc:      SpO2: 99% 99% 98% 100%  Weight:       Medications  ondansetron (ZOFRAN) injection 4 mg (4 mg Intravenous Given 01/31/22 0503)  sodium chloride 0.9 % bolus 1,000 mL (0 mLs Intravenous Stopped 01/31/22 0620)  fentaNYL (SUBLIMAZE) injection 100 mcg (100 mcg Intravenous Given 01/31/22 0504)  metoCLOPramide (REGLAN) injection 10 mg (10 mg Intravenous Given 01/31/22 0619)   6:21 AM Patient now states she may be allergic to IV contrast.  CT with IV contrast canceled and we will order a CT without IV contrast.  The CT technician states this will require oral contrast so there will be a delay in getting the CT scan done.  7:00 AM Signed out to Dr. 04/03/22. CT pending.   PROCEDURES  Procedures   ED DIAGNOSES     ICD-10-CM   1. Abdominal pain, unspecified abdominal location  R10.9  2. Constipation, unspecified constipation type  K59.00          Cody Albus, MD 01/31/22 2246

## 2022-01-31 NOTE — ED Provider Notes (Signed)
Patient initially seen by Dr. Read Drivers.  Please see his notes.  Patient CT scan does not show any acute abnormality patient.  States she used to be on MiraLAX but stopped taking it.  She has been having daily bowel movements but now maybe every 4 to 5 days.  Suspect symptoms may be related to the constipation findings noted on CT.  We will have her resume MiraLAX and go to twice daily for the next several days.  Dulcolax suppositories as needed.  We will also provide prescribed Bentyl cramping.  Laboratory tests were otherwise unremarkable.   Linwood Dibbles, MD 01/31/22 [id number]

## 2022-01-31 NOTE — ED Notes (Signed)
Patient transported to CT 

## 2022-01-31 NOTE — ED Triage Notes (Signed)
Reports RLQ abdominal pain for the last few days.  Also endorses n/v.  Reports running a fever the last 24 hours. Max 102.1.  afebrile currently.

## 2022-01-31 NOTE — Discharge Instructions (Signed)
Take the medications as prescribed to help with the stomach cramping and constipation.  Increase your MiraLAX to twice daily.

## 2022-02-01 DIAGNOSIS — R0609 Other forms of dyspnea: Secondary | ICD-10-CM | POA: Diagnosis not present

## 2022-02-01 DIAGNOSIS — R1013 Epigastric pain: Secondary | ICD-10-CM | POA: Diagnosis not present

## 2022-02-01 DIAGNOSIS — R079 Chest pain, unspecified: Secondary | ICD-10-CM | POA: Diagnosis not present

## 2022-02-01 DIAGNOSIS — J209 Acute bronchitis, unspecified: Secondary | ICD-10-CM | POA: Diagnosis not present

## 2022-02-01 DIAGNOSIS — R072 Precordial pain: Secondary | ICD-10-CM | POA: Diagnosis not present

## 2022-02-01 DIAGNOSIS — R0789 Other chest pain: Secondary | ICD-10-CM | POA: Diagnosis not present

## 2022-02-01 DIAGNOSIS — R5383 Other fatigue: Secondary | ICD-10-CM | POA: Diagnosis not present

## 2022-02-01 DIAGNOSIS — L988 Other specified disorders of the skin and subcutaneous tissue: Secondary | ICD-10-CM | POA: Diagnosis not present

## 2022-02-04 ENCOUNTER — Encounter: Payer: Self-pay | Admitting: Nurse Practitioner

## 2022-02-04 DIAGNOSIS — J208 Acute bronchitis due to other specified organisms: Secondary | ICD-10-CM | POA: Diagnosis not present

## 2022-02-04 DIAGNOSIS — M79669 Pain in unspecified lower leg: Secondary | ICD-10-CM | POA: Diagnosis not present

## 2022-02-04 DIAGNOSIS — B9689 Other specified bacterial agents as the cause of diseases classified elsewhere: Secondary | ICD-10-CM | POA: Diagnosis not present

## 2022-02-04 NOTE — Telephone Encounter (Signed)
Stefanie Lucero typically ask patient to schedule office visit, but 5-6 pad in 1 hour and leg pain. Lucero want to check with you if ER is better choice.

## 2022-02-05 ENCOUNTER — Ambulatory Visit (INDEPENDENT_AMBULATORY_CARE_PROVIDER_SITE_OTHER): Payer: BC Managed Care – PPO | Admitting: Nurse Practitioner

## 2022-02-05 VITALS — BP 108/64 | HR 101 | Resp 20

## 2022-02-05 DIAGNOSIS — Z881 Allergy status to other antibiotic agents status: Secondary | ICD-10-CM | POA: Diagnosis not present

## 2022-02-05 DIAGNOSIS — Z91012 Allergy to eggs: Secondary | ICD-10-CM | POA: Diagnosis not present

## 2022-02-05 DIAGNOSIS — Z91048 Other nonmedicinal substance allergy status: Secondary | ICD-10-CM | POA: Diagnosis not present

## 2022-02-05 DIAGNOSIS — Z91040 Latex allergy status: Secondary | ICD-10-CM | POA: Diagnosis not present

## 2022-02-05 DIAGNOSIS — D500 Iron deficiency anemia secondary to blood loss (chronic): Secondary | ICD-10-CM | POA: Diagnosis not present

## 2022-02-05 DIAGNOSIS — N939 Abnormal uterine and vaginal bleeding, unspecified: Secondary | ICD-10-CM

## 2022-02-05 DIAGNOSIS — Z888 Allergy status to other drugs, medicaments and biological substances status: Secondary | ICD-10-CM | POA: Diagnosis not present

## 2022-02-05 DIAGNOSIS — K219 Gastro-esophageal reflux disease without esophagitis: Secondary | ICD-10-CM | POA: Diagnosis not present

## 2022-02-05 DIAGNOSIS — Z91018 Allergy to other foods: Secondary | ICD-10-CM | POA: Diagnosis not present

## 2022-02-05 DIAGNOSIS — Z79899 Other long term (current) drug therapy: Secondary | ICD-10-CM | POA: Diagnosis not present

## 2022-02-05 MED ORDER — NORETHIN ACE-ETH ESTRAD-FE 1-20 MG-MCG PO TABS: 1.0000 | ORAL_TABLET | Freq: Every day | ORAL | 1 refills | Status: DC

## 2022-02-05 NOTE — Progress Notes (Signed)
   Acute Office Visit  Subjective:    Patient ID: Stefanie Lucero, female    DOB: Jul 13, 1991, 31 y.o.   MRN: [id number]   HPI 31 y.o. presents today for heavy bleeding. Bleeding began 02/01/2022 and has been passing half dollar sized clots and changing 6-8 tampons every hour. Severe dysmenorrhea and bilateral leg pain. It was recommended she go to ER yesterday and she did. Neg beta hcg, hgb 11.5, normal hct. Menses were due 02/11/2022. Normal CT scan 01/31/2022, other than stable right ovarian cyst. Has had worsening menses since IUD removal 09/2020. H/O very heavy menses. Was prescribed patches in March, has not started due to other health concerns. Saw cardiology for what she thought was chest pain, ECHO normal. On antibiotics for bronchitis and a skin infection of breast.    Review of Systems  Constitutional: Negative.   Gastrointestinal:  Positive for abdominal pain.  Genitourinary:  Positive for menstrual problem.       Objective:    Physical Exam Constitutional:      Appearance: Normal appearance.  Genitourinary:    General: Normal vulva.     Vagina: Normal.     Cervix: Normal.     Uterus: Tender. Not enlarged.      BP 108/64 (BP Location: Right Arm)   Pulse (!) 101   Resp 20   LMP 01/12/2022 (Exact Date)   SpO2 98%  Wt Readings from Last 3 Encounters:  01/31/22 182 lb 15.7 oz (83 kg)  12/21/21 183 lb (83 kg)  12/08/21 182 lb 15.7 oz (83 kg)        Patient informed chaperone available to be present for breast and/or pelvic exam. Patient has requested no chaperone to be present. Patient has been advised what will be completed during breast and pelvic exam.   Assessment & Plan:   Problem List Items Addressed This Visit   None Visit Diagnoses     Abnormal uterine bleeding    -  Primary   Relevant Medications   norethindrone-ethinyl estradiol-FE (LOESTRIN FE) 1-20 MG-MCG tablet   Iron deficiency anemia due to chronic blood loss          Plan: Discussed CT  01/31/2022, normal reproductive findings other than right ovarian cyst (stable from previous imaging). Discussed management with hormonal contraception. She is considering an IUD again but wants to start COCs now. If bleeding does not slow within 1 week she will let us know.      Olivia Mackie DNP, 11:01 AM 02/05/2022

## 2022-02-08 ENCOUNTER — Ambulatory Visit: Payer: BC Managed Care – PPO | Admitting: Neurology

## 2022-02-11 DIAGNOSIS — R0789 Other chest pain: Secondary | ICD-10-CM | POA: Diagnosis not present

## 2022-02-11 DIAGNOSIS — R053 Chronic cough: Secondary | ICD-10-CM | POA: Diagnosis not present

## 2022-02-11 DIAGNOSIS — F411 Generalized anxiety disorder: Secondary | ICD-10-CM | POA: Diagnosis not present

## 2022-02-11 DIAGNOSIS — R5383 Other fatigue: Secondary | ICD-10-CM | POA: Diagnosis not present

## 2022-02-11 DIAGNOSIS — R0609 Other forms of dyspnea: Secondary | ICD-10-CM | POA: Diagnosis not present

## 2022-02-11 DIAGNOSIS — R202 Paresthesia of skin: Secondary | ICD-10-CM | POA: Diagnosis not present

## 2022-02-11 DIAGNOSIS — G894 Chronic pain syndrome: Secondary | ICD-10-CM | POA: Diagnosis not present

## 2022-02-18 DIAGNOSIS — R0602 Shortness of breath: Secondary | ICD-10-CM | POA: Diagnosis not present

## 2022-02-18 DIAGNOSIS — R079 Chest pain, unspecified: Secondary | ICD-10-CM | POA: Diagnosis not present

## 2022-02-18 DIAGNOSIS — R002 Palpitations: Secondary | ICD-10-CM | POA: Diagnosis not present

## 2022-02-19 ENCOUNTER — Other Ambulatory Visit: Payer: Self-pay | Admitting: Nurse Practitioner

## 2022-02-19 ENCOUNTER — Encounter: Payer: Self-pay | Admitting: Nurse Practitioner

## 2022-02-19 DIAGNOSIS — N939 Abnormal uterine and vaginal bleeding, unspecified: Secondary | ICD-10-CM

## 2022-02-22 DIAGNOSIS — N83201 Unspecified ovarian cyst, right side: Secondary | ICD-10-CM | POA: Diagnosis not present

## 2022-02-22 DIAGNOSIS — R0789 Other chest pain: Secondary | ICD-10-CM | POA: Diagnosis not present

## 2022-02-22 DIAGNOSIS — R0609 Other forms of dyspnea: Secondary | ICD-10-CM | POA: Diagnosis not present

## 2022-02-22 DIAGNOSIS — R002 Palpitations: Secondary | ICD-10-CM | POA: Diagnosis not present

## 2022-02-22 DIAGNOSIS — R42 Dizziness and giddiness: Secondary | ICD-10-CM | POA: Diagnosis not present

## 2022-02-22 NOTE — Telephone Encounter (Signed)
Please schedule ultrasound. Patient aware. Orders placed. Thanks.

## 2022-02-23 ENCOUNTER — Other Ambulatory Visit: Payer: Self-pay

## 2022-02-23 ENCOUNTER — Emergency Department (HOSPITAL_BASED_OUTPATIENT_CLINIC_OR_DEPARTMENT_OTHER)
Admission: EM | Admit: 2022-02-23 | Discharge: 2022-02-23 | Disposition: A | Discharge status: Home / Self Care | Payer: BC Managed Care – PPO | Attending: Emergency Medicine | Admitting: Emergency Medicine

## 2022-02-23 ENCOUNTER — Other Ambulatory Visit: Payer: Self-pay | Admitting: Emergency Medicine

## 2022-02-23 ENCOUNTER — Telehealth: Payer: Self-pay

## 2022-02-23 ENCOUNTER — Encounter (HOSPITAL_BASED_OUTPATIENT_CLINIC_OR_DEPARTMENT_OTHER): Payer: Self-pay

## 2022-02-23 ENCOUNTER — Emergency Department (HOSPITAL_BASED_OUTPATIENT_CLINIC_OR_DEPARTMENT_OTHER): Payer: BC Managed Care – PPO

## 2022-02-23 DIAGNOSIS — R102 Pelvic and perineal pain: Secondary | ICD-10-CM | POA: Diagnosis not present

## 2022-02-23 DIAGNOSIS — N760 Acute vaginitis: Secondary | ICD-10-CM | POA: Diagnosis not present

## 2022-02-23 DIAGNOSIS — N390 Urinary tract infection, site not specified: Secondary | ICD-10-CM | POA: Diagnosis not present

## 2022-02-23 DIAGNOSIS — B9789 Other viral agents as the cause of diseases classified elsewhere: Secondary | ICD-10-CM | POA: Diagnosis not present

## 2022-02-23 DIAGNOSIS — K219 Gastro-esophageal reflux disease without esophagitis: Secondary | ICD-10-CM | POA: Insufficient documentation

## 2022-02-23 DIAGNOSIS — B9689 Other specified bacterial agents as the cause of diseases classified elsewhere: Secondary | ICD-10-CM | POA: Diagnosis not present

## 2022-02-23 DIAGNOSIS — R1031 Right lower quadrant pain: Secondary | ICD-10-CM | POA: Diagnosis not present

## 2022-02-23 DIAGNOSIS — R1084 Generalized abdominal pain: Secondary | ICD-10-CM

## 2022-02-23 DIAGNOSIS — N83201 Unspecified ovarian cyst, right side: Secondary | ICD-10-CM | POA: Diagnosis not present

## 2022-02-23 DIAGNOSIS — K257 Chronic gastric ulcer without hemorrhage or perforation: Secondary | ICD-10-CM | POA: Diagnosis not present

## 2022-02-23 DIAGNOSIS — Z91040 Latex allergy status: Secondary | ICD-10-CM | POA: Diagnosis not present

## 2022-02-23 LAB — WET PREP, GENITAL
Sperm: NONE SEEN
Trich, Wet Prep: NONE SEEN
WBC, Wet Prep HPF POC: <10 (ref <10)
Yeast Wet Prep HPF POC: NONE SEEN

## 2022-02-23 MED ORDER — METRONIDAZOLE 500 MG PO TABS: 500.0000 mg | ORAL_TABLET | Freq: Two times a day (BID) | ORAL | 0 refills | Status: DC

## 2022-02-23 NOTE — ED Triage Notes (Signed)
C/o abdominal pain, pelvic pain and lower back pain x 4 days. States pain progressively worsening. Seen at [facility] yesterday, they were unable to perform ultrasound. Known ovarian cyst.

## 2022-02-23 NOTE — Telephone Encounter (Signed)
She needs urgent U/S. We can fit her into my schedule Thursday if she is agreeable. If symptoms become severe and/or she experiences nausea and vomiting with pain she needs to return to ER for emergent U/S due to risk of ovarian torsion.

## 2022-02-23 NOTE — Telephone Encounter (Signed)
Patient was very relieved to have the u/s scheduled for Thursday 8.3.23 at 1:30pm.  She said she understood Tiffany's instructions. She said she really does not feel like it is ovarian torsion but she understands. Appt scheduled.

## 2022-02-23 NOTE — Telephone Encounter (Signed)
I called and spoke with patient.  She said TW aware that she has been having pain and bleeding and she is scheduled for u/s.  She said she can't eat because of the pain. Yesterday pain was worse and she was vomiting. Went to [facility] in [place]. They are part of [facility]. She said they did not have u/s in that ER until 8am.  She was there from 12am-4 am this morning. She said that the doctor wanted to send her to [facility] for the u/s but she did not want to have to do that so he told her to call Gyn first thing this morning and see if she can get u/s asap.  Patient said she thought maybe it was GI related but that physician "was 90% sure" that her GI sx were related to her pain.   This morning her pain is 5-6. She took Midol earlier.

## 2022-02-23 NOTE — ED Provider Notes (Signed)
[facility] EMERGENCY DEPARTMENT Provider Note   CSN: [id number] Arrival date & time: 02/23/22  1521     History {Add pertinent medical, surgical, social history, OB history to HPI:1} Chief Complaint  Patient presents with  . Pelvic Pain    Stefanie Lucero is a 31 y.o. female with Hx of known ovarian cyst, migraines, anxiety with care seeking behavior, GERD, vertigo, eosinophilic esophagitis, gastric ulcer.  Presenting today with continued abdominal pain and bilateral low back pain over the last 4 to 5 days.  Pain usually worse after meals, especially within 20 to 40 minutes of eating.  Patient states she has a known umbilical hernia, which was not visualized on CT imaging earlier this morning.  Was curious whether this disappeared.  Endorses nausea, denies vomiting or diarrhea.  Denies fevers, chills, neck pain or stiffness, dysuria, urinary retention, or recent vaginal bleeding.  Several rounds of CT imaging within the last few months.  Also has follow-up with GI in a few weeks.  Currently takes 40 mg PPI twice daily and Pepcid twice daily.  Strongly endorses no significant changes since her ED visit discharged this morning.  Describes herself as having a very high pain tolerance.  Seen at an ED with [facility] yesterday -- work-up negative including CT abdomen and pelvis, urinalysis, urine pregnancy, basic labs.  Only image findings was confirmatory of a simple appearing right ovarian cyst.  Has an ultrasound scheduled with her OB/GYN this coming Thursday, but was informed by them to be seen sooner in the ED if the pain is unmanageable.    The history is provided by the patient and medical records.  Pelvic Pain      Home Medications Prior to Admission medications   Medication Sig Start Date End Date Taking? Authorizing Provider  acetaminophen (TYLENOL) 500 MG tablet Take 1,000 mg by mouth every 6 (six) hours as needed for moderate pain or headache.    [provider]  albuterol (VENTOLIN HFA) 108 (90 Base) MCG/ACT inhaler Inhale 2 puffs into the lungs every 4 (four) hours as needed for wheezing or shortness of breath. 11/06/20   [provider]  amoxicillin (AMOXIL) 500 MG tablet Take by mouth. 01/20/22   [provider]  bisacodyl (DULCOLAX) 10 MG suppository Place 1 suppository (10 mg total) rectally as needed for moderate constipation. 01/31/22   Linwood Dibbles, MD  cetirizine (ZYRTEC ALLERGY) 10 MG tablet Take 1 tablet (10 mg total) by mouth daily. 12/23/21   Wallis Bamberg, PA-C  diazepam (VALIUM) 5 MG tablet Take 0.5-1 tablets (2.5-5 mg total) by mouth every 6 (six) hours as needed (for vertigo). 12/08/21   Molpus, John, MD  diclofenac Sodium (VOLTAREN) 1 % GEL Apply 2 g topically 4 (four) times daily as needed. 06/16/21   Long, Arlyss Repress, MD  dicyclomine (BENTYL) 20 MG tablet Take 1 tablet (20 mg total) by mouth 2 (two) times daily. 01/31/22   Linwood Dibbles, MD  famotidine (PEPCID) 20 MG tablet Take 1 tablet (20 mg total) by mouth 2 (two) times daily. 09/14/21   Palumbo, April, MD  FLUoxetine (PROZAC) 10 MG capsule Take 10 mg by mouth daily. 11/09/21   [provider]  fluticasone (FLONASE) 50 MCG/ACT nasal spray Place 2 sprays into both nostrils daily. 12/23/21   Wallis Bamberg, PA-C  gabapentin (NEURONTIN) 100 MG capsule Take 100 mg by mouth 3 (three) times daily as needed. 11/09/21   [provider]  Galcanezumab-gnlm (EMGALITY) 120 MG/ML SOAJ  Inject 120 mg into the skin every 28 (twenty-eight) days. 11/24/21   Pieter Partridge, DO  lansoprazole (PREVACID) 30 MG capsule Take 1 capsule (30 mg total) by mouth 2 (two) times daily before a meal. 05/25/21 08/23/21  Lynden Oxford Scales, PA-C  norethindrone-ethinyl estradiol-FE (LOESTRIN FE) 1-20 MG-MCG tablet Take 1 tablet by mouth daily. 02/05/22   Tamela Gammon, NP  ondansetron (ZOFRAN-ODT) 8 MG disintegrating tablet Take 1 tablet (8 mg total) by mouth every 8 (eight) hours as  needed for nausea or vomiting. 12/23/21   Jaynee Eagles, PA-C  pantoprazole (PROTONIX) 20 MG tablet Take 1 tablet (20 mg total) by mouth daily. 11/25/21 12/25/21  Mickie Hillier, PA-C  polyethylene glycol (MIRALAX) 17 g packet Take 17 g by mouth 2 (two) times daily. 01/31/22 03/02/22  Dorie Rank, MD  pseudoephedrine (SUDAFED) 30 MG tablet Take 1 tablet (30 mg total) by mouth every 8 (eight) hours as needed for congestion. 12/23/21   Jaynee Eagles, PA-C      Allergies    Eggs or egg-derived products, Iodinated contrast media, Iodine, Latex, Nutmeg oil (myristica oil), Cefdinir, Cefuroxime, Guggulipid-black pepper, Nickel, and Other    Review of Systems   Review of Systems  Genitourinary:  Positive for pelvic pain.    Physical Exam Updated Vital Signs BP (!) 138/98   Pulse (!) 107   Temp 98 F (36.7 C) (Oral)   Resp 18   Ht 5\' 3"  (1.6 m)   Wt 81.6 kg   LMP 02/01/2022 (Exact Date)   SpO2 99%   BMI 31.89 kg/m  Physical Exam Vitals and nursing note reviewed.  Constitutional:      General: She is not in acute distress.    Appearance: She is well-developed. She is not ill-appearing, toxic-appearing or diaphoretic.  HENT:     Head: Normocephalic and atraumatic.  Eyes:     Conjunctiva/sclera: Conjunctivae normal.  Cardiovascular:     Rate and Rhythm: Normal rate and regular rhythm.     Heart sounds: No murmur heard. Pulmonary:     Effort: Pulmonary effort is normal. No respiratory distress.     Breath sounds: Normal breath sounds.  Abdominal:     Palpations: Abdomen is soft.     Tenderness: There is generalized abdominal tenderness. There is no right CVA tenderness or left CVA tenderness.     Comments: Clinically inconsistent abdominal exam.  Musculoskeletal:        General: No swelling.     Cervical back: Neck supple.     Right lower leg: No edema.     Left lower leg: No edema.     Comments: Again clinical exam inconsistent, with apparent left lower back and right lower back tenderness  just superior lateral to the SI joint.  With some additional tenderness of the left buttock and hip.  No evidence of reduced ROM, dislocation, or superficial skin changes.  No obvious deformities, skin changes, swelling, warmth, or erythema.  Skin:    General: Skin is warm and dry.     Capillary Refill: Capillary refill takes less than 2 seconds.  Neurological:     Mental Status: She is alert and oriented to person, place, and time.  Psychiatric:        Mood and Affect: Mood normal.    ED Results / Procedures / Treatments   Labs (all labs ordered are listed, but only abnormal results are displayed) Labs Reviewed  WET PREP, GENITAL  GC/CHLAMYDIA PROBE AMP ([place]) NOT  AT [facility]    EKG None  Radiology No results found.  Procedures Procedures  {Document cardiac monitor, telemetry assessment procedure when appropriate:1}  Medications Ordered in ED Medications - No data to display  ED Course/ Medical Decision Making/ A&P                           Medical Decision Making Amount and/or Complexity of Data Reviewed Labs: ordered. Radiology: ordered.   31 y.o. female presents to the ED for concern of Pelvic Pain   This involves an extensive number of treatment options, and is a complaint that carries with it a high risk of complications and morbidity.     Past Medical History / Co-morbidities / Social History: Hx of known right ovarian cyst, migraines, anxiety with care seeking behavior, GERD, vertigo, eosinophilic esophagitis, gastric ulcer Social Determinants of Health include: None  Additional History:  Internal and external records from outside source obtained and reviewed including ED visit from yesterday/today CT abdomen/pelvis:  Pasty Spillers, MD - 02/23/2022   Formatting of this note might be different from the original. Tamora (ROUTINE), 02/23/2022 12:53 AM  INDICATION:RLQ abdominal pain (Age >= 14y) \ R10.31 Abdominal pain, RLQ  (right lower quadrant)  COMPARISON: CT abdomen and pelvis 01/21/2022.  TECHNIQUE: Multislice axial images were obtained through the abdomen and pelvis without administration of iodinated intravenous contrast material. Multi-planar reformatted images were generated for additional analysis. Nongated technique limits cardiac detail.  All CT scans at [facility] are performed using radiation dose optimization techniques as appropriate to a performed exam, including but not limited to one or more of the following: automatic exposure control, adjustment of the mA and/or kV according to patient size, use of iterative reconstruction technique. In addition, our institution participates in a radiation dose monitoring program to optimize patient radiation exposure.  FINDINGS:   LOWER CHEST: . Mediastinum: Within normal limits.  . Heart/vessel: Normal heart size. No pericardial effusion. . Lungs/pleura: Within normal limits.  ABDOMEN: . Liver: Within normal limits.  . Gallbladder/biliary: No gallbladder wall thickening or pericholecystic fluid. No biliary ductal dilation.  Marland Kitchen Spleen: Small splenule.  . Pancreas: Similar hypoattenuating appearance of the pancreatic head, favored to reflect fatty infiltration. No definite mass.  . Adrenals: Within normal limits.  . Kidneys: No hydronephrosis. No radiopaque nephrolithiasis. Tiny exophytic right upper pole lesion (series 2 image 43), too small to characterize but statistically benign. . GI tract: Colonic diverticulosis without evidence of diverticulitis. No bowel obstruction. Normal appendix in the right lower quadrant. . Peritoneum/Mesentery/Extraperitoneum: No free fluid or pneumoperitoneum.  . Vascular: No aneurysmal change.   PELVIS: . Ureters: Within normal limits.  . Bladder: Under distended.  . Reproductive system: Similar 2.4 cm right adnexal cyst with attenuation suggesting simple fluid.    MSK: . No acute osseous abnormality.   CONCLUSION: 1. No evidence of acute abdominopelvic abnormality.  2. No evidence of acute appendicitis. 3. Right ovarian cyst is simple appearing. However, these are symptoms symptomatic.  Lab Tests: I ordered, and personally interpreted labs.  The pertinent results include:   Gonorrhea/chlamydia: Pending Wet prep: *** Considered ordering CBC, BMP, UA, and urine pregnancy, however these were recently performed this morning and patient without any change in symptoms since being discharged.  Therefore I believe these may be deferred at this time.  Imaging Studies: I ordered imaging studies including pelvic ultrasound series.  I independently visualized and interpreted imaging which showed *** I agree with the radiologist interpretation.  ED Course: Pt anxious-appearing on exam, accompanied by mother.  Nonseptic, not ill-appearing in NAD.  AAOx4.  ABCs intact.  Presenting with continued diffuse abdominal pain with bilateral lower back pain and nausea without vomiting.  Also had some irregular heavy bleeding 2 weeks after her menses less than 4 weeks ago.  Pt concerned of hemorrhagic/ruptured ovarian cyst.  Seen in the ED last night/this morning which resulted negative work-up.  CT imaging only indicates known right ovarian cyst, without other acute abdominal/pelvic pathology (see above).  Negative pregnancy and unremarkable urinalysis.  Without dysuria, hematuria, or evidence of UTI.  Considered reordering basic labs and imaging, however without significant changes since drawn less than 24 hours ago.  Low initial suspicion for appendicitis, pancreatitis, bowel obstruction, bowel perforation, pyelonephritis, UTI, ectopic pregnancy, or PID.  Upcoming appointment with OB/GYN in 2 days, was recommended by them to continue to the ED if pain was unmanageable.  Utilizes Zofran for nausea relief.  Abdominal pain usually worsened after eating.  Hx of PUD with known  gastric ulcer, and takes 40 mg PPI twice daily and Pepcid twice daily.  Upcoming follow-up with GI in 3 weeks.   Overall exam inconsistent.  Pt again very anxious appearing.  Appears tender in most areas palpated of abdomen and back. May have been tensing in anticipation.  Abdomen does not appear surgical, is soft and nondistended.  Low suspicion of renal calculi or pyelonephritis.  No clear McBurney's sign or Murphy's sign.  Low suspicion for appendicitis at this time.  Without midline or paraspinal muscle tenderness, low suspicion for lumbar fracture or dislocation.  Upper and lower extremities appear neurovascularly intact.  No clear evidence of radiculopathy.  With some apparent tenderness to left buttock and left hip.  Low clinical suspicion for gout, infectious arthritis, or fracture/dislocation.  Tenderness appears superficial of this joint area.  Plan to proceed with G/C and wet prep for completeness, as well as vaginal ultrasound series Upon re-evaluation, pt status remains unchanged.  Wet prep positive for clue cells.  Plan to treat for bacterial vaginosis.   Patient in NAD and in good condition at time of discharge.  Disposition: After consideration the patient's encounter today, I do not feel today's workup suggests an emergent condition requiring admission or immediate intervention beyond what has been performed at this time.  Safe for discharge; instructed to return immediately for worsening symptoms, change in symptoms or any other concerns.  I have reviewed the patients home medicines and have made adjustments as needed.  Discussed course of treatment with the patient, whom demonstrated understanding.  Patient in agreement and has no further questions.    I discussed this case with my attending physician Dr. Deretha Emory, who agreed with the proposed treatment course and cosigned this note including patient's presenting symptoms, physical exam, and planned diagnostics and interventions.   Attending physician stated agreement with plan or made changes to plan which were implemented.     This chart was dictated using voice recognition software.  Despite best efforts to proofread, errors can occur which can change the documentation meaning.   {Document critical care time when appropriate:1} {Document review of labs and clinical decision tools ie heart score, Chads2Vasc2 etc:1}  {Document your independent review of radiology images, and any outside records:1} {Document your discussion with family members, caretakers, and with consultants:1} {Document social determinants of health affecting pt's care:1} {Document your decision making  why or why not admission, treatments were needed:1} Final Clinical Impression(s) / ED Diagnoses Final diagnoses:  None    Rx / DC Orders ED Discharge Orders     None

## 2022-02-23 NOTE — Discharge Instructions (Addendum)
Your labs were positive for Bacterial Vaginiosis.  Further information regarding this condition has been provided for you review.  A prescription has been sent to the pharmacy by name of Flagyl.  Please take as directed.  Do not consume alcohol within 72 hours of taking this medication, as this may cause a side effect of severe nausea and vomiting.  Continue with your OB/GYN follow-up as planned.  In addition, reach out to your GI specialist to be able to follow-up sooner than the end of the month.  And continue taking your PPI and your Pepcid as prescribed.  You may also utilize anti-inflammatories such as Motrin, in addition to Tylenol, for additional pain relief.  Always take Motrin with plenty of food and water first, then the medication.  Return to the ED for new or worsening symptoms as discussed.

## 2022-02-24 ENCOUNTER — Telehealth (HOSPITAL_BASED_OUTPATIENT_CLINIC_OR_DEPARTMENT_OTHER): Payer: Self-pay

## 2022-02-24 ENCOUNTER — Telehealth: Payer: Self-pay

## 2022-02-24 NOTE — Telephone Encounter (Signed)
Needed to get in touch with pt to let her know that we had removed her from the [place] schedule on 02/25/22 due to her having a recent US done @ a local ED on 02/23/22 and provider reported that another was not needed. However, did leave her on the provider's schedule in case she still wanted to come in to discuss her problems and options of treatment. Pt did not answer so left DVM per DPR.

## 2022-02-24 NOTE — Telephone Encounter (Signed)
Received call from cytology lab that incorrect swab was placed in GC/Chlamydia test tube. Will need to be re-collected.

## 2022-02-25 ENCOUNTER — Encounter: Payer: Self-pay | Admitting: Nurse Practitioner

## 2022-02-25 ENCOUNTER — Telehealth (HOSPITAL_BASED_OUTPATIENT_CLINIC_OR_DEPARTMENT_OTHER): Payer: Self-pay | Admitting: Emergency Medicine

## 2022-02-25 ENCOUNTER — Other Ambulatory Visit: Payer: BC Managed Care – PPO

## 2022-02-25 ENCOUNTER — Ambulatory Visit (INDEPENDENT_AMBULATORY_CARE_PROVIDER_SITE_OTHER): Payer: BC Managed Care – PPO | Admitting: Nurse Practitioner

## 2022-02-25 VITALS — BP 92/62 | HR 91

## 2022-02-25 DIAGNOSIS — N920 Excessive and frequent menstruation with regular cycle: Secondary | ICD-10-CM | POA: Diagnosis not present

## 2022-02-25 DIAGNOSIS — K5900 Constipation, unspecified: Secondary | ICD-10-CM | POA: Diagnosis not present

## 2022-02-25 DIAGNOSIS — R1030 Lower abdominal pain, unspecified: Secondary | ICD-10-CM

## 2022-02-25 NOTE — Telephone Encounter (Signed)
Pt confirmed received msg @ today's visit. Will close encounter.

## 2022-02-25 NOTE — Progress Notes (Signed)
   Acute Office Visit  Subjective:    Patient ID: Stefanie Lucero, female    DOB: 1990-08-15, 31 y.o.   MRN: [id number]   HPI 31 y.o. presents today for pelvic discomfort and lower back pain. Originally scheduled for ultrasound today but had one done at ER 02/23/2022 but she still wanted to follow up and discuss.  U/S was normal. Right ovarian cyst seen on prior CT 01/31/2022, resolved. Was also having heavy bleeding that started 02/01/2022 (10 days before menses were due), bleeding stopped yesterday after starting COCs. Pain has improved but still there. Ibuprofen and tylenol do help some. She has appointment with GI next week. Just prior to ER visit she had episode of vomiting. Bowel movements 2x weekly, takes pre and probiotics, laxatives, stool softeners. Lots of personal stressors, started Cymbalta.    Review of Systems  Constitutional:  Positive for fatigue.  Gastrointestinal:  Positive for abdominal pain and constipation. Negative for abdominal distention, diarrhea, nausea and vomiting.  Genitourinary:  Positive for menstrual problem. Negative for vaginal bleeding and vaginal discharge.  Musculoskeletal:  Positive for back pain.       Objective:    Physical Exam Constitutional:      Appearance: Normal appearance.   GU: Not indicated  BP 92/62   Pulse 91   LMP 02/01/2022 (Exact Date)   SpO2 99%  Wt Readings from Last 3 Encounters:  02/23/22 180 lb (81.6 kg)  01/31/22 182 lb 15.7 oz (83 kg)  12/21/21 183 lb (83 kg)        Patient informed chaperone available to be present for breast and/or pelvic exam. Patient has requested no chaperone to be present. Patient has been advised what will be completed during breast and pelvic exam.   Assessment & Plan:   Problem List Items Addressed This Visit   None Visit Diagnoses     Lower abdominal pain    -  Primary   Constipation, unspecified constipation type       Menorrhagia with regular cycle          Plan: Continue COCs for  bleeding management. May take continuously if still experiencing dysmenorrhea. Keep appointment with GI next week. Discussed ultrasound findings from ER visit. Important to control stress and anxiety, recommend therapy which she has been considering. All questions answered.      Olivia Mackie DNP, 3:03 PM 02/25/2022

## 2022-03-01 DIAGNOSIS — K200 Eosinophilic esophagitis: Secondary | ICD-10-CM | POA: Diagnosis not present

## 2022-03-01 DIAGNOSIS — K5900 Constipation, unspecified: Secondary | ICD-10-CM | POA: Diagnosis not present

## 2022-03-01 DIAGNOSIS — K2950 Unspecified chronic gastritis without bleeding: Secondary | ICD-10-CM | POA: Diagnosis not present

## 2022-03-01 DIAGNOSIS — K219 Gastro-esophageal reflux disease without esophagitis: Secondary | ICD-10-CM | POA: Diagnosis not present

## 2022-03-01 LAB — MOLECULAR ANCILLARY ONLY
Comment: NEGATIVE
Comment: NORMAL

## 2022-03-04 DIAGNOSIS — Z9081 Acquired absence of spleen: Secondary | ICD-10-CM | POA: Diagnosis not present

## 2022-03-04 DIAGNOSIS — Q8909 Congenital malformations of spleen: Secondary | ICD-10-CM | POA: Diagnosis not present

## 2022-03-04 DIAGNOSIS — R109 Unspecified abdominal pain: Secondary | ICD-10-CM | POA: Diagnosis not present

## 2022-03-04 DIAGNOSIS — R1031 Right lower quadrant pain: Secondary | ICD-10-CM | POA: Diagnosis not present

## 2022-03-06 DIAGNOSIS — M25512 Pain in left shoulder: Secondary | ICD-10-CM | POA: Diagnosis not present

## 2022-03-06 DIAGNOSIS — R110 Nausea: Secondary | ICD-10-CM | POA: Diagnosis not present

## 2022-03-06 DIAGNOSIS — R1013 Epigastric pain: Secondary | ICD-10-CM | POA: Diagnosis not present

## 2022-03-09 DIAGNOSIS — K219 Gastro-esophageal reflux disease without esophagitis: Secondary | ICD-10-CM | POA: Diagnosis not present

## 2022-03-09 DIAGNOSIS — R072 Precordial pain: Secondary | ICD-10-CM | POA: Diagnosis not present

## 2022-03-17 ENCOUNTER — Other Ambulatory Visit: Payer: BC Managed Care – PPO

## 2022-03-17 ENCOUNTER — Other Ambulatory Visit: Payer: BC Managed Care – PPO | Admitting: Nurse Practitioner

## 2022-03-20 DIAGNOSIS — R0789 Other chest pain: Secondary | ICD-10-CM | POA: Diagnosis not present

## 2022-03-20 DIAGNOSIS — R0602 Shortness of breath: Secondary | ICD-10-CM | POA: Diagnosis not present

## 2022-03-20 DIAGNOSIS — Z20822 Contact with and (suspected) exposure to covid-19: Secondary | ICD-10-CM | POA: Diagnosis not present

## 2022-03-20 DIAGNOSIS — R079 Chest pain, unspecified: Secondary | ICD-10-CM | POA: Diagnosis not present

## 2022-03-25 DIAGNOSIS — K581 Irritable bowel syndrome with constipation: Secondary | ICD-10-CM | POA: Diagnosis not present

## 2022-03-25 DIAGNOSIS — Z8371 Family history of colonic polyps: Secondary | ICD-10-CM | POA: Diagnosis not present

## 2022-03-25 DIAGNOSIS — K200 Eosinophilic esophagitis: Secondary | ICD-10-CM | POA: Diagnosis not present

## 2022-03-25 DIAGNOSIS — K625 Hemorrhage of anus and rectum: Secondary | ICD-10-CM | POA: Diagnosis not present

## 2022-04-02 DIAGNOSIS — R1110 Vomiting, unspecified: Secondary | ICD-10-CM | POA: Diagnosis not present

## 2022-04-02 DIAGNOSIS — R109 Unspecified abdominal pain: Secondary | ICD-10-CM | POA: Diagnosis not present

## 2022-04-02 DIAGNOSIS — K529 Noninfective gastroenteritis and colitis, unspecified: Secondary | ICD-10-CM | POA: Diagnosis not present

## 2022-04-03 DIAGNOSIS — R1110 Vomiting, unspecified: Secondary | ICD-10-CM | POA: Diagnosis not present

## 2022-04-03 DIAGNOSIS — K529 Noninfective gastroenteritis and colitis, unspecified: Secondary | ICD-10-CM | POA: Diagnosis not present

## 2022-04-05 DIAGNOSIS — K209 Esophagitis, unspecified without bleeding: Secondary | ICD-10-CM | POA: Diagnosis not present

## 2022-04-06 DIAGNOSIS — Z130 Encounter for screening for diseases of the blood and blood-forming organs and certain disorders involving the immune mechanism: Secondary | ICD-10-CM | POA: Diagnosis not present

## 2022-04-06 DIAGNOSIS — Z0000 Encounter for general adult medical examination without abnormal findings: Secondary | ICD-10-CM | POA: Diagnosis not present

## 2022-04-06 DIAGNOSIS — Z1329 Encounter for screening for other suspected endocrine disorder: Secondary | ICD-10-CM | POA: Diagnosis not present

## 2022-04-06 DIAGNOSIS — Z13220 Encounter for screening for lipoid disorders: Secondary | ICD-10-CM | POA: Diagnosis not present

## 2022-04-06 DIAGNOSIS — Z13228 Encounter for screening for other metabolic disorders: Secondary | ICD-10-CM | POA: Diagnosis not present

## 2022-04-06 DIAGNOSIS — Z79899 Other long term (current) drug therapy: Secondary | ICD-10-CM | POA: Diagnosis not present

## 2022-04-06 DIAGNOSIS — F418 Other specified anxiety disorders: Secondary | ICD-10-CM | POA: Diagnosis not present

## 2022-04-09 DIAGNOSIS — R059 Cough, unspecified: Secondary | ICD-10-CM | POA: Diagnosis not present

## 2022-04-09 DIAGNOSIS — R0789 Other chest pain: Secondary | ICD-10-CM | POA: Diagnosis not present

## 2022-04-09 DIAGNOSIS — M940 Chondrocostal junction syndrome [Tietze]: Secondary | ICD-10-CM | POA: Diagnosis not present

## 2022-04-09 DIAGNOSIS — Z20822 Contact with and (suspected) exposure to covid-19: Secondary | ICD-10-CM | POA: Diagnosis not present

## 2022-04-09 DIAGNOSIS — J1089 Influenza due to other identified influenza virus with other manifestations: Secondary | ICD-10-CM | POA: Diagnosis not present

## 2022-04-13 DIAGNOSIS — G894 Chronic pain syndrome: Secondary | ICD-10-CM | POA: Diagnosis not present

## 2022-04-13 DIAGNOSIS — R1012 Left upper quadrant pain: Secondary | ICD-10-CM | POA: Diagnosis not present

## 2022-04-13 DIAGNOSIS — F411 Generalized anxiety disorder: Secondary | ICD-10-CM | POA: Diagnosis not present

## 2022-04-18 DIAGNOSIS — S70362A Insect bite (nonvenomous), left thigh, initial encounter: Secondary | ICD-10-CM | POA: Diagnosis not present

## 2022-04-18 DIAGNOSIS — L03116 Cellulitis of left lower limb: Secondary | ICD-10-CM | POA: Diagnosis not present

## 2022-04-23 DIAGNOSIS — Z20822 Contact with and (suspected) exposure to covid-19: Secondary | ICD-10-CM | POA: Diagnosis not present

## 2022-04-23 DIAGNOSIS — R059 Cough, unspecified: Secondary | ICD-10-CM | POA: Diagnosis not present

## 2022-04-23 DIAGNOSIS — R079 Chest pain, unspecified: Secondary | ICD-10-CM | POA: Diagnosis not present

## 2022-04-23 DIAGNOSIS — Z79899 Other long term (current) drug therapy: Secondary | ICD-10-CM | POA: Diagnosis not present

## 2022-04-23 DIAGNOSIS — R0789 Other chest pain: Secondary | ICD-10-CM | POA: Diagnosis not present

## 2022-04-26 NOTE — Progress Notes (Deleted)
NEUROLOGY FOLLOW UP OFFICE NOTE  Stefanie Lucero [id number]  Assessment/Plan:   Near-syncope - if cardiac etiology ruled out, consider migraine-related Migraine with and without aura, without status migrainosus, not intractable   Migraine prevention:  start Emgality.  Cannot take Aimovig due to reported latex allergy Migraine rescue:  While underlying cardiac etiology for her chest pain is unlikely, she should not be taking a triptan while workup is ongoing.  Will give her samples of Ubrelvy to try for now.   Zofran for nausea.  If/when cardiac workup negative, then we can try another triptan such as rizatriptan.   STOP TYLENOL.  Limit use of pain relievers to no more than 2 days out of week to prevent risk of rebound or medication-overuse headache. Keep headache diary Follow up 4-5 months       Subjective:  Stefanie Lucero is a 31 year old female with asthma and anxiety who follows up for migraines.   UPDATE:    Saw neurology at [facility] for ongoing neck pain and left arm tingling.  MRI cervical spine on *** was unremarkable.  Started exercises which helped.     Bernita Raisin Current NSAIDS/analgesics:  none Current triptans:  none Current ergotamine:  none Current anti-emetic:  Zofran 4mg  Current muscle relaxants:  none Current Antihypertensive medications:  none Current Antidepressant medications:  none Current Anticonvulsant medications:  none Current anti-CGRP:  Emgality Current Vitamins/Herbal/Supplements:  none Current Antihistamines/Decongestants:  meclizine Other therapy:  none Hormone/birth control:  none   Caffeine:  1 to 2 diet sodas daily.  No coffee Diet:  at least 30 oz water daily.  1-2 diet sodas daily.  Now tries not to skip meals Exercise:  none Depression:  yes.  Stable; Anxiety:  yes Other pain:  Central chest and bilateral shoulder pain (query fibromyalgia Sleep hygiene:  9 PM to 5:30 AM.    HISTORY:  She has had migraines since age 17.   They were severe until age 54.  They now occur 1 or 2 times a month.  They are severe bilateral fronto-temporal pressure/throbbing with nausea, photophobia, phonophobia, as well as visual aura (sees rings of light or blurred vision).  They typically last 24 hours (2-3 hours to 3 days).  Takes sumatriptan and goes to sleep.  If at work, only Tylenol.   Since September 2022, she started having dizzy spells.  It happens at any time, with or without movement.  It happens when driving, sitting or standing.  She develops severe ringing in ears and everybody sounds like they are talking slowly.  She feels nauseous and feels like she is going to pass out but doesn't lose consciousness.  No associated headaches, diaphoresis, or palpitations.  She looks like she has a blank stare.  They last 5-10 minutes.  They have been occurring once to twice a week.  She has tried not to skip meals now.  MRI of brain with and without contrast on 08/01/2021 was normal.  EEG on 09/22/2021 was normal.       CT head without contrast on 03/08/2021 showed "no acute intracranial abnormality".  CTV of head on 10/09/2020 was negative for dural sinus thrombosis.         Past NSAIDS/analgesics:  ibuprofen (GERD) Past abortive triptans:  sumatriptan tab, rizatriptan 10mg  Past abortive ergotamine:  none Past muscle relaxants:  none Past anti-emetic:  none Past antihypertensive medications:  atenolol Past antidepressant medications:  nortriptyline, duloxetine, sertraline Past anticonvulsant medications:  topiramate Past anti-CGRP:  none.  Cannot take Aimovig due to latex allergy. Past vitamins/Herbal/Supplements:  none Past antihistamines/decongestants:  loratidine, cetirizine, pseudoephedrine, Flonase Other past therapies:  none     Family history of migraines:  No  PAST MEDICAL HISTORY: Past Medical History:  Diagnosis Date   Anxiety    Asthma    Eosinophilic esophagitis    Gastric ulcer    Gastritis    Headaches, cluster     Migraine    Pancreatic mass    Vertigo     MEDICATIONS: Current Outpatient Medications on File Prior to Visit  Medication Sig Dispense Refill   acetaminophen (TYLENOL) 500 MG tablet Take 1,000 mg by mouth every 6 (six) hours as needed for moderate pain or headache.     albuterol (VENTOLIN HFA) 108 (90 Base) MCG/ACT inhaler Inhale 2 puffs into the lungs every 4 (four) hours as needed for wheezing or shortness of breath.     cetirizine (ZYRTEC ALLERGY) 10 MG tablet Take 1 tablet (10 mg total) by mouth daily. 30 tablet 0   clonazePAM (KLONOPIN) 0.5 MG tablet Take by mouth.     diazepam (VALIUM) 5 MG tablet Take 0.5-1 tablets (2.5-5 mg total) by mouth every 6 (six) hours as needed (for vertigo). 20 tablet 0   diclofenac Sodium (VOLTAREN) 1 % GEL Apply 2 g topically 4 (four) times daily as needed. 100 g 0   dicyclomine (BENTYL) 20 MG tablet Take 1 tablet (20 mg total) by mouth 2 (two) times daily. 20 tablet 0   DULoxetine (CYMBALTA) 60 MG capsule Take 60 mg by mouth daily.     famotidine (PEPCID) 20 MG tablet Take 1 tablet (20 mg total) by mouth 2 (two) times daily. 14 tablet 0   FLUoxetine (PROZAC) 10 MG capsule Take 10 mg by mouth daily.     fluticasone (FLONASE) 50 MCG/ACT nasal spray Place 2 sprays into both nostrils daily. 16 g 12   gabapentin (NEURONTIN) 100 MG capsule Take 100 mg by mouth 3 (three) times daily as needed.     Galcanezumab-gnlm (EMGALITY) 120 MG/ML SOAJ Inject 120 mg into the skin every 28 (twenty-eight) days. 1.12 mL 5   lansoprazole (PREVACID) 30 MG capsule TAKE 1 CAPSULE BY MOUTH TWICE DAILY BEFORE A MEAL     metoprolol tartrate (LOPRESSOR) 25 MG tablet Take by mouth.     metroNIDAZOLE (FLAGYL) 500 MG tablet Take 1 tablet (500 mg total) by mouth 2 (two) times daily. One po bid x 7 days 14 tablet 0   norethindrone-ethinyl estradiol-FE (LOESTRIN FE) 1-20 MG-MCG tablet Take 1 tablet by mouth daily. 84 tablet 1   ondansetron (ZOFRAN-ODT) 8 MG disintegrating tablet Take 1  tablet (8 mg total) by mouth every 8 (eight) hours as needed for nausea or vomiting. 20 tablet 0   pantoprazole (PROTONIX) 20 MG tablet Take 1 tablet (20 mg total) by mouth daily. 30 tablet 0   pseudoephedrine (SUDAFED) 30 MG tablet Take 1 tablet (30 mg total) by mouth every 8 (eight) hours as needed for congestion. 30 tablet 0   No current facility-administered medications on file prior to visit.    ALLERGIES: Allergies  Allergen Reactions   Eggs Or Egg-Derived Products Anaphylaxis, Hives and Rash   Iodinated Contrast Media Shortness Of Breath and Itching    Patient states mild shortness of breath and lip swelling during a prior iv contrast ct scan. She states that at other facilities she has been given an allergy prep prior to an iv contrasted (iodinated)  ct scan that successfully prevented an allergic reaction during and after iv contrast injection.   Iodine Anaphylaxis, Hives and Other (See Comments)    Pt states "mouth itches" and SHOB   Latex Itching    (Condoms)   Nutmeg Oil (Myristica Oil) Anaphylaxis and Rash   Cefdinir Hives   Cefuroxime Hives   Guggulipid-Black Pepper Hives    & White Pepper  & White Pepper    Nickel Rash   Other Hives    & White Pepper  & White Pepper     FAMILY HISTORY: Family History  Problem Relation Age of Onset   Hypertension Mother    Stroke Father    Heart attack Father        times 2   Hypertension Father    Dementia Maternal Grandmother    Alzheimer's disease Maternal Grandmother    Irregular heart beat Maternal Grandmother    Stroke Maternal Grandfather    Gallbladder disease Maternal Grandfather    Colon cancer Paternal Grandfather 44              Objective:  *** General: No acute distress.  Patient appears ***-groomed.   Head:  Normocephalic/atraumatic Eyes:  Fundi examined but not visualized Neck: supple, no paraspinal tenderness, full range of motion Heart:  Regular rate and rhythm Lungs:  Clear to auscultation  bilaterally Back: No paraspinal tenderness Neurological Exam: alert and oriented to person, place, and time.  Speech fluent and not dysarthric, language intact.  CN II-XII intact. Bulk and tone normal, muscle strength 5/5 throughout.  Sensation to light touch intact.  Deep tendon reflexes 2+ throughout, toes downgoing.  Finger to nose testing intact.  Gait normal, Romberg negative.   Metta Clines, DO  CC: ***

## 2022-04-27 ENCOUNTER — Ambulatory Visit: Payer: BC Managed Care – PPO | Admitting: Neurology

## 2022-04-27 ENCOUNTER — Encounter: Payer: Self-pay | Admitting: Neurology

## 2022-04-27 DIAGNOSIS — Z029 Encounter for administrative examinations, unspecified: Secondary | ICD-10-CM

## 2022-04-28 DIAGNOSIS — R079 Chest pain, unspecified: Secondary | ICD-10-CM | POA: Diagnosis not present

## 2022-04-28 DIAGNOSIS — K219 Gastro-esophageal reflux disease without esophagitis: Secondary | ICD-10-CM | POA: Diagnosis not present

## 2022-04-28 DIAGNOSIS — R0602 Shortness of breath: Secondary | ICD-10-CM | POA: Diagnosis not present

## 2022-04-28 DIAGNOSIS — K2100 Gastro-esophageal reflux disease with esophagitis, without bleeding: Secondary | ICD-10-CM | POA: Diagnosis not present

## 2022-05-06 DIAGNOSIS — K209 Esophagitis, unspecified without bleeding: Secondary | ICD-10-CM | POA: Diagnosis not present

## 2022-05-06 DIAGNOSIS — R079 Chest pain, unspecified: Secondary | ICD-10-CM | POA: Diagnosis not present

## 2022-05-06 DIAGNOSIS — I498 Other specified cardiac arrhythmias: Secondary | ICD-10-CM | POA: Diagnosis not present

## 2022-05-11 ENCOUNTER — Telehealth: Payer: Self-pay | Admitting: Neurology

## 2022-05-11 NOTE — Telephone Encounter (Signed)
Patient dismissed from [facility] by Metta Clines, DO, effective 04/27/22. Dismissal Letter sent out by 1st class mail. KLM

## 2022-05-13 DIAGNOSIS — Z8719 Personal history of other diseases of the digestive system: Secondary | ICD-10-CM | POA: Diagnosis not present

## 2022-05-13 DIAGNOSIS — Z83719 Family history of colon polyps, unspecified: Secondary | ICD-10-CM | POA: Diagnosis not present

## 2022-05-13 DIAGNOSIS — D123 Benign neoplasm of transverse colon: Secondary | ICD-10-CM | POA: Diagnosis not present

## 2022-05-13 DIAGNOSIS — K625 Hemorrhage of anus and rectum: Secondary | ICD-10-CM | POA: Diagnosis not present

## 2022-05-13 DIAGNOSIS — K640 First degree hemorrhoids: Secondary | ICD-10-CM | POA: Diagnosis not present

## 2022-05-13 DIAGNOSIS — K581 Irritable bowel syndrome with constipation: Secondary | ICD-10-CM | POA: Diagnosis not present

## 2022-05-13 DIAGNOSIS — K2100 Gastro-esophageal reflux disease with esophagitis, without bleeding: Secondary | ICD-10-CM | POA: Diagnosis not present

## 2022-05-13 DIAGNOSIS — K648 Other hemorrhoids: Secondary | ICD-10-CM | POA: Diagnosis not present

## 2022-05-13 DIAGNOSIS — K229 Disease of esophagus, unspecified: Secondary | ICD-10-CM | POA: Diagnosis not present

## 2022-05-13 DIAGNOSIS — K200 Eosinophilic esophagitis: Secondary | ICD-10-CM | POA: Diagnosis not present

## 2022-05-13 DIAGNOSIS — K3189 Other diseases of stomach and duodenum: Secondary | ICD-10-CM | POA: Diagnosis not present

## 2022-05-13 DIAGNOSIS — K222 Esophageal obstruction: Secondary | ICD-10-CM | POA: Diagnosis not present

## 2022-05-13 DIAGNOSIS — K639 Disease of intestine, unspecified: Secondary | ICD-10-CM | POA: Diagnosis not present

## 2022-05-13 DIAGNOSIS — K209 Esophagitis, unspecified without bleeding: Secondary | ICD-10-CM | POA: Diagnosis not present

## 2022-05-13 DIAGNOSIS — K2950 Unspecified chronic gastritis without bleeding: Secondary | ICD-10-CM | POA: Diagnosis not present

## 2022-05-21 DIAGNOSIS — R072 Precordial pain: Secondary | ICD-10-CM | POA: Diagnosis not present

## 2022-05-28 DIAGNOSIS — G901 Familial dysautonomia [Riley-Day]: Secondary | ICD-10-CM | POA: Diagnosis not present

## 2022-05-28 DIAGNOSIS — R0789 Other chest pain: Secondary | ICD-10-CM | POA: Diagnosis not present

## 2022-05-28 DIAGNOSIS — R0602 Shortness of breath: Secondary | ICD-10-CM | POA: Diagnosis not present

## 2022-05-28 DIAGNOSIS — R002 Palpitations: Secondary | ICD-10-CM | POA: Diagnosis not present

## 2022-06-02 DIAGNOSIS — Z20822 Contact with and (suspected) exposure to covid-19: Secondary | ICD-10-CM | POA: Diagnosis not present

## 2022-06-02 DIAGNOSIS — J209 Acute bronchitis, unspecified: Secondary | ICD-10-CM | POA: Diagnosis not present

## 2022-06-02 DIAGNOSIS — R059 Cough, unspecified: Secondary | ICD-10-CM | POA: Diagnosis not present

## 2022-06-11 DIAGNOSIS — R1012 Left upper quadrant pain: Secondary | ICD-10-CM | POA: Diagnosis not present

## 2022-06-11 DIAGNOSIS — Z79899 Other long term (current) drug therapy: Secondary | ICD-10-CM | POA: Diagnosis not present

## 2022-06-11 DIAGNOSIS — B349 Viral infection, unspecified: Secondary | ICD-10-CM | POA: Diagnosis not present

## 2022-06-11 DIAGNOSIS — R197 Diarrhea, unspecified: Secondary | ICD-10-CM | POA: Diagnosis not present

## 2022-06-11 DIAGNOSIS — F419 Anxiety disorder, unspecified: Secondary | ICD-10-CM | POA: Diagnosis not present

## 2022-06-11 DIAGNOSIS — R112 Nausea with vomiting, unspecified: Secondary | ICD-10-CM | POA: Diagnosis not present

## 2022-06-11 DIAGNOSIS — Z20822 Contact with and (suspected) exposure to covid-19: Secondary | ICD-10-CM | POA: Diagnosis not present

## 2022-06-11 DIAGNOSIS — Z881 Allergy status to other antibiotic agents status: Secondary | ICD-10-CM | POA: Diagnosis not present

## 2022-06-13 DIAGNOSIS — R051 Acute cough: Secondary | ICD-10-CM | POA: Diagnosis not present

## 2022-06-13 DIAGNOSIS — R42 Dizziness and giddiness: Secondary | ICD-10-CM | POA: Diagnosis not present

## 2022-06-13 DIAGNOSIS — R0602 Shortness of breath: Secondary | ICD-10-CM | POA: Diagnosis not present

## 2022-06-16 DIAGNOSIS — H539 Unspecified visual disturbance: Secondary | ICD-10-CM | POA: Diagnosis not present

## 2022-06-16 DIAGNOSIS — H5711 Ocular pain, right eye: Secondary | ICD-10-CM | POA: Diagnosis not present

## 2022-06-16 DIAGNOSIS — R519 Headache, unspecified: Secondary | ICD-10-CM | POA: Diagnosis not present

## 2022-06-16 DIAGNOSIS — R42 Dizziness and giddiness: Secondary | ICD-10-CM | POA: Diagnosis not present

## 2022-06-18 DIAGNOSIS — R519 Headache, unspecified: Secondary | ICD-10-CM | POA: Diagnosis not present

## 2022-06-18 DIAGNOSIS — H539 Unspecified visual disturbance: Secondary | ICD-10-CM | POA: Diagnosis not present

## 2022-06-18 DIAGNOSIS — H5711 Ocular pain, right eye: Secondary | ICD-10-CM | POA: Diagnosis not present

## 2022-06-18 DIAGNOSIS — H538 Other visual disturbances: Secondary | ICD-10-CM | POA: Diagnosis not present

## 2022-06-18 DIAGNOSIS — R29818 Other symptoms and signs involving the nervous system: Secondary | ICD-10-CM | POA: Diagnosis not present

## 2022-06-18 DIAGNOSIS — R42 Dizziness and giddiness: Secondary | ICD-10-CM | POA: Diagnosis not present

## 2022-06-18 DIAGNOSIS — H532 Diplopia: Secondary | ICD-10-CM | POA: Diagnosis not present

## 2022-06-22 ENCOUNTER — Other Ambulatory Visit: Payer: Self-pay

## 2022-06-22 ENCOUNTER — Encounter (HOSPITAL_BASED_OUTPATIENT_CLINIC_OR_DEPARTMENT_OTHER): Payer: Self-pay

## 2022-06-22 ENCOUNTER — Emergency Department (HOSPITAL_BASED_OUTPATIENT_CLINIC_OR_DEPARTMENT_OTHER)
Admission: EM | Admit: 2022-06-22 | Discharge: 2022-06-23 | Disposition: A | Discharge status: Home / Self Care | Payer: BC Managed Care – PPO | Attending: Emergency Medicine | Admitting: Emergency Medicine

## 2022-06-22 DIAGNOSIS — M25511 Pain in right shoulder: Secondary | ICD-10-CM | POA: Insufficient documentation

## 2022-06-22 DIAGNOSIS — R9431 Abnormal electrocardiogram [ECG] [EKG]: Secondary | ICD-10-CM | POA: Diagnosis not present

## 2022-06-22 DIAGNOSIS — K529 Noninfective gastroenteritis and colitis, unspecified: Secondary | ICD-10-CM | POA: Insufficient documentation

## 2022-06-22 DIAGNOSIS — R112 Nausea with vomiting, unspecified: Secondary | ICD-10-CM | POA: Diagnosis not present

## 2022-06-22 DIAGNOSIS — J45909 Unspecified asthma, uncomplicated: Secondary | ICD-10-CM | POA: Diagnosis not present

## 2022-06-22 DIAGNOSIS — R1011 Right upper quadrant pain: Secondary | ICD-10-CM | POA: Diagnosis not present

## 2022-06-22 DIAGNOSIS — Z91040 Latex allergy status: Secondary | ICD-10-CM | POA: Insufficient documentation

## 2022-06-22 NOTE — ED Triage Notes (Signed)
Pt states she has had N/V/D since Monday and developed RT. Shoulder pain @5pm  that radiates to her back On/off CP "Feel faint" Hx of gastritis Tachycardic in triage

## 2022-06-23 ENCOUNTER — Emergency Department (HOSPITAL_BASED_OUTPATIENT_CLINIC_OR_DEPARTMENT_OTHER): Payer: BC Managed Care – PPO

## 2022-06-23 ENCOUNTER — Emergency Department (HOSPITAL_BASED_OUTPATIENT_CLINIC_OR_DEPARTMENT_OTHER): Payer: BC Managed Care – PPO | Admitting: Radiology

## 2022-06-23 DIAGNOSIS — R079 Chest pain, unspecified: Secondary | ICD-10-CM | POA: Diagnosis not present

## 2022-06-23 DIAGNOSIS — R1011 Right upper quadrant pain: Secondary | ICD-10-CM | POA: Diagnosis not present

## 2022-06-23 LAB — URINALYSIS, ROUTINE W REFLEX MICROSCOPIC
Bilirubin Urine: NEGATIVE
Glucose, UA: NEGATIVE mg/dL
Ketones, ur: 15 mg/dL — AB
Leukocytes,Ua: NEGATIVE
Nitrite: NEGATIVE
Protein, ur: NEGATIVE mg/dL
Specific Gravity, Urine: 1.016 (ref 1.005–1.030)
pH: 7 (ref 5.0–8.0)

## 2022-06-23 LAB — COMPREHENSIVE METABOLIC PANEL
ALT: 14 U/L (ref 0–44)
AST: 16 U/L (ref 15–41)
Albumin: 4.7 g/dL (ref 3.5–5.0)
Alkaline Phosphatase: 50 U/L (ref 38–126)
Anion gap: 9 (ref 5–15)
BUN: 12 mg/dL (ref 6–20)
CO2: 23 mmol/L (ref 22–32)
Calcium: 9.3 mg/dL (ref 8.9–10.3)
Chloride: 105 mmol/L (ref 98–111)
Creatinine, Ser: 0.68 mg/dL (ref 0.44–1.00)
GFR, Estimated: >60 mL/min (ref >60)
Glucose, Bld: 128 mg/dL — ABNORMAL HIGH (ref 70–99)
Potassium: 3.4 mmol/L — ABNORMAL LOW (ref 3.5–5.1)
Sodium: 137 mmol/L (ref 135–145)
Total Bilirubin: 0.3 mg/dL (ref 0.3–1.2)
Total Protein: 7.5 g/dL (ref 6.5–8.1)

## 2022-06-23 LAB — CBC WITH DIFFERENTIAL/PLATELET
Abs Immature Granulocytes: 0.02 10*3/uL (ref 0.00–0.07)
Basophils Absolute: 0 10*3/uL (ref 0.0–0.1)
Basophils Relative: 0 %
Eosinophils Absolute: 0.2 10*3/uL (ref 0.0–0.5)
Eosinophils Relative: 2 %
HCT: 37.9 % (ref 36.0–46.0)
Hemoglobin: 12 g/dL (ref 12.0–15.0)
Immature Granulocytes: 0 %
Lymphocytes Relative: 26 %
Lymphs Abs: 3 10*3/uL (ref 0.7–4.0)
MCH: 26.2 pg (ref 26.0–34.0)
MCHC: 31.7 g/dL (ref 30.0–36.0)
MCV: 82.8 fL (ref 80.0–100.0)
Monocytes Absolute: 0.7 10*3/uL (ref 0.1–1.0)
Monocytes Relative: 6 %
Neutro Abs: 7.4 10*3/uL (ref 1.7–7.7)
Neutrophils Relative %: 66 %
Platelets: 293 10*3/uL (ref 150–400)
RBC: 4.58 MIL/uL (ref 3.87–5.11)
RDW: 14 % (ref 11.5–15.5)
WBC: 11.3 10*3/uL — ABNORMAL HIGH (ref 4.0–10.5)
nRBC: 0 % (ref 0.0–0.2)

## 2022-06-23 LAB — WET PREP, GENITAL
Clue Cells Wet Prep HPF POC: NONE SEEN
Sperm: NONE SEEN
Trich, Wet Prep: NONE SEEN
WBC, Wet Prep HPF POC: >=10 — AB (ref <10)
Yeast Wet Prep HPF POC: NONE SEEN

## 2022-06-23 LAB — D-DIMER, QUANTITATIVE: D-Dimer, Quant: <0.27 ug/mL-FEU (ref 0.00–0.50)

## 2022-06-23 LAB — PREGNANCY, URINE: Preg Test, Ur: NEGATIVE

## 2022-06-23 LAB — TROPONIN I (HIGH SENSITIVITY): Troponin I (High Sensitivity): <2 ng/L (ref <18)

## 2022-06-23 MED ORDER — KETOROLAC TROMETHAMINE 15 MG/ML IJ SOLN
15.0000 mg | Freq: Once | INTRAMUSCULAR | Status: AC
  Administered 2022-06-23: 15 mg via INTRAVENOUS
  Filled 2022-06-23: qty 1

## 2022-06-23 MED ORDER — ONDANSETRON HCL 4 MG/2ML IJ SOLN
4.0000 mg | Freq: Once | INTRAMUSCULAR | Status: AC
  Administered 2022-06-23: 4 mg via INTRAVENOUS
  Filled 2022-06-23: qty 2

## 2022-06-23 MED ORDER — PANTOPRAZOLE SODIUM 40 MG IV SOLR
40.0000 mg | Freq: Once | INTRAVENOUS | Status: DC
  Filled 2022-06-23: qty 10

## 2022-06-23 MED ORDER — FENTANYL CITRATE PF 50 MCG/ML IJ SOSY
100.0000 ug | PREFILLED_SYRINGE | Freq: Once | INTRAMUSCULAR | Status: AC
  Administered 2022-06-23: 100 ug via INTRAVENOUS
  Filled 2022-06-23: qty 2

## 2022-06-23 MED ORDER — METOCLOPRAMIDE HCL 5 MG/ML IJ SOLN
10.0000 mg | Freq: Once | INTRAMUSCULAR | Status: AC | PRN
  Administered 2022-06-23: 10 mg via INTRAVENOUS
  Filled 2022-06-23: qty 2

## 2022-06-23 MED ORDER — SODIUM CHLORIDE 0.9 % IV BOLUS
1000.0000 mL | Freq: Once | INTRAVENOUS | Status: AC
  Administered 2022-06-23: 1000 mL via INTRAVENOUS

## 2022-06-23 MED ORDER — METOCLOPRAMIDE HCL 10 MG PO TABS: 10.0000 mg | ORAL_TABLET | Freq: Four times a day (QID) | ORAL | 0 refills | Status: DC | PRN

## 2022-06-23 MED ORDER — FAMOTIDINE IN NACL 20-0.9 MG/50ML-% IV SOLN
20.0000 mg | Freq: Once | INTRAVENOUS | Status: AC
  Administered 2022-06-23: 20 mg via INTRAVENOUS
  Filled 2022-06-23: qty 50

## 2022-06-23 NOTE — ED Provider Notes (Signed)
[facility] Provider Note: Lowella Dell, MD, FACEP  CSN: [id number] MRN: [id number] ARRIVAL: 06/22/22 at 2321 ROOM: DB014/DB014   CHIEF COMPLAINT  Shoulder Pain and Vomiting   HISTORY OF PRESENT ILLNESS  06/23/22 1:19 AM Stefanie Lucero is a 30 y.o. female with history of eosinophilic esophagitis and GERD.  She has had nausea, vomiting and diarrhea for the past 2 days.  She has also had gradually worsening rapid right upper quadrant pain.  He also developed pain in her right shoulder yesterday evening about 5 PM, worse with movement.  She is not aware of any injury but she admits she does vomit fairly forcefully.  She rates her shoulder pain as an 8 out of 10 and it is worse than her right upper quadrant pain.  Past Medical History:  Diagnosis Date   Anxiety    Asthma    Eosinophilic esophagitis    Gastric ulcer    Gastritis    Headaches, cluster    Migraine    Pancreatic mass    Vertigo     Past Surgical History:  Procedure Laterality Date   CESAREAN SECTION     x 2   ESOPHAGEAL MANOMETRY N/A 10/07/2021   Procedure: ESOPHAGEAL MANOMETRY (EM);  Surgeon: Kathi Der, MD;  Location: [facility];  Service: Gastroenterology;  Laterality: N/A;   INTRAUTERINE DEVICE (IUD) INSERTION     mirena inserted 12-07-2019   PH IMPEDANCE STUDY  10/07/2021   Procedure: PH IMPEDANCE STUDY;  Surgeon: Kathi Der, MD;  Location: [facility];  Service: Gastroenterology;;   TONSILLECTOMY  2001    Family History  Problem Relation Age of Onset   Hypertension Mother    Stroke Father    Heart attack Father        times 2   Hypertension Father    Dementia Maternal Grandmother    Alzheimer's disease Maternal Grandmother    Irregular heart beat Maternal Grandmother    Stroke Maternal Grandfather    Gallbladder disease Maternal Grandfather    Colon cancer Paternal Grandfather 3            Social History   Tobacco Use   Smoking status: Never   Smokeless tobacco:  Never  Vaping Use   Vaping Use: Never used  Substance Use Topics   Alcohol use: Yes    Comment: occ   Drug use: No    Prior to Admission medications   Medication Sig Start Date End Date Taking? Authorizing Provider  metoCLOPramide (REGLAN) 10 MG tablet Take 1 tablet (10 mg total) by mouth every 6 (six) hours as needed for nausea or vomiting. 06/23/22  Yes Geo Slone, MD  acetaminophen (TYLENOL) 500 MG tablet Take 1,000 mg by mouth every 6 (six) hours as needed for moderate pain or headache.    [provider]  albuterol (VENTOLIN HFA) 108 (90 Base) MCG/ACT inhaler Inhale 2 puffs into the lungs every 4 (four) hours as needed for wheezing or shortness of breath. 11/06/20   [provider]  cetirizine (ZYRTEC ALLERGY) 10 MG tablet Take 1 tablet (10 mg total) by mouth daily. 12/23/21   Wallis Bamberg, PA-C  clonazePAM (KLONOPIN) 0.5 MG tablet Take by mouth. 12/31/21   [provider]  diazepam (VALIUM) 5 MG tablet Take 0.5-1 tablets (2.5-5 mg total) by mouth every 6 (six) hours as needed (for vertigo). 12/08/21   Jadin Kagel, MD  diclofenac Sodium (VOLTAREN) 1 % GEL Apply 2 g topically 4 (four) times daily as needed.  06/16/21   Long, Arlyss Repress, MD  dicyclomine (BENTYL) 20 MG tablet Take 1 tablet (20 mg total) by mouth 2 (two) times daily. 01/31/22   Linwood Dibbles, MD  DULoxetine (CYMBALTA) 60 MG capsule Take 60 mg by mouth daily. 12/31/21   [provider]  famotidine (PEPCID) 20 MG tablet Take 1 tablet (20 mg total) by mouth 2 (two) times daily. 09/14/21   Palumbo, April, MD  FLUoxetine (PROZAC) 10 MG capsule Take 10 mg by mouth daily. 11/09/21   [provider]  fluticasone (FLONASE) 50 MCG/ACT nasal spray Place 2 sprays into both nostrils daily. 12/23/21   Wallis Bamberg, PA-C  gabapentin (NEURONTIN) 100 MG capsule Take 100 mg by mouth 3 (three) times daily as needed. 11/09/21   [provider]  Galcanezumab-gnlm (EMGALITY) 120 MG/ML SOAJ Inject 120 mg into  the skin every 28 (twenty-eight) days. 11/24/21   Everlena Cooper, Adam R, DO  lansoprazole (PREVACID) 30 MG capsule TAKE 1 CAPSULE BY MOUTH TWICE DAILY BEFORE A MEAL 05/25/21   [provider]  metoprolol tartrate (LOPRESSOR) 25 MG tablet Take by mouth. 02/22/22 05/23/22  [provider]  norethindrone-ethinyl estradiol-FE (LOESTRIN FE) 1-20 MG-MCG tablet Take 1 tablet by mouth daily. 02/05/22   Olivia Mackie, NP  ondansetron (ZOFRAN-ODT) 8 MG disintegrating tablet Take 1 tablet (8 mg total) by mouth every 8 (eight) hours as needed for nausea or vomiting. 12/23/21   Wallis Bamberg, PA-C  pantoprazole (PROTONIX) 20 MG tablet Take 1 tablet (20 mg total) by mouth daily. 11/25/21 12/25/21  Cristopher Peru, PA-C  pseudoephedrine (SUDAFED) 30 MG tablet Take 1 tablet (30 mg total) by mouth every 8 (eight) hours as needed for congestion. 12/23/21   Wallis Bamberg, PA-C    Allergies Eggs or egg-derived products, Iodinated contrast media, Iodine, Latex, Nutmeg oil (myristica oil), Protonix [pantoprazole], Cefdinir, Cefuroxime, Guggulipid-black pepper, Nickel, and Other   REVIEW OF SYSTEMS  Negative except as noted here or in the History of Present Illness.   PHYSICAL EXAMINATION  Initial Vital Signs Blood pressure 124/87, pulse (!) 103, temperature 98.2 F (36.8 C), resp. rate 18, height  (1.6 m), weight 81.6 kg, last menstrual period 05/29/2022, SpO2 98 %.  Examination General: Well-developed, well-nourished female in no acute distress; appearance consistent with age of record HENT: normocephalic; atraumatic Eyes: Normal appearance Neck: supple; movement of neck does not exacerbate shoulder pain Heart: regular rate and rhythm Lungs: clear to auscultation bilaterally Abdomen: soft; nondistended; right upper quadrant tenderness; bowel sounds present GU: Normal external genitalia; no vaginal bleeding; physiologic appearing vaginal discharge; no cervical motion tenderness; no adnexal  tenderness Extremities: No deformity; full range of motion; pulses normal; pain on movement of right shoulder but not especially worse on abduction or internal rotation Neurologic: Awake, alert and oriented; motor function intact in all extremities and symmetric; no facial droop Skin: Warm and dry Psychiatric: Normal mood and affect   RESULTS  Summary of this visit's results, reviewed and interpreted by myself:   EKG Interpretation  Date/Time:  Tuesday June 22 2022 23:38:30 EST Ventricular Rate:  123 PR Interval:  160 QRS Duration: 70 QT Interval:  300 QTC Calculation: 429 R Axis:   54 Text Interpretation: Sinus tachycardia Septal infarct , age undetermined Abnormal ECG Rate is faster Confirmed by Paula Libra ([id number]) on 06/23/2022 12:15:02 AM       Laboratory Studies: Results for orders placed or performed during the hospital encounter of 06/22/22 (from the past 24 hour(s))  CBC with  Differential     Status: Abnormal   Collection Time: 06/22/22 11:38 PM  Result Value Ref Range   WBC 11.3 (H) 4.0 - 10.5 K/uL   RBC 4.58 3.87 - 5.11 MIL/uL   Hemoglobin 12.0 12.0 - 15.0 g/dL   HCT 08.6 57.8 - 46.9 %   MCV 82.8 80.0 - 100.0 fL   MCH 26.2 26.0 - 34.0 pg   MCHC 31.7 30.0 - 36.0 g/dL   RDW 62.9 52.8 - 41.3 %   Platelets 293 150 - 400 K/uL   nRBC 0.0 0.0 - 0.2 %   Neutrophils Relative % 66 %   Neutro Abs 7.4 1.7 - 7.7 K/uL   Lymphocytes Relative 26 %   Lymphs Abs 3.0 0.7 - 4.0 K/uL   Monocytes Relative 6 %   Monocytes Absolute 0.7 0.1 - 1.0 K/uL   Eosinophils Relative 2 %   Eosinophils Absolute 0.2 0.0 - 0.5 K/uL   Basophils Relative 0 %   Basophils Absolute 0.0 0.0 - 0.1 K/uL   Immature Granulocytes 0 %   Abs Immature Granulocytes 0.02 0.00 - 0.07 K/uL  Comprehensive metabolic panel     Status: Abnormal   Collection Time: 06/22/22 11:38 PM  Result Value Ref Range   Sodium 137 135 - 145 mmol/L   Potassium 3.4 (L) 3.5 - 5.1 mmol/L   Chloride 105 98 - 111 mmol/L    CO2 23 22 - 32 mmol/L   Glucose, Bld 128 (H) 70 - 99 mg/dL   BUN 12 6 - 20 mg/dL   Creatinine, Ser 2.44 0.44 - 1.00 mg/dL   Calcium 9.3 8.9 - 01.0 mg/dL   Total Protein 7.5 6.5 - 8.1 g/dL   Albumin 4.7 3.5 - 5.0 g/dL   AST 16 15 - 41 U/L   ALT 14 0 - 44 U/L   Alkaline Phosphatase 50 38 - 126 U/L   Total Bilirubin 0.3 0.3 - 1.2 mg/dL   GFR, Estimated >27 >25 mL/min   Anion gap 9 5 - 15  Troponin I (High Sensitivity)     Status: None   Collection Time: 06/22/22 11:38 PM  Result Value Ref Range   Troponin I (High Sensitivity) <2 <18 ng/L  Urinalysis, Routine w reflex microscopic Urine, Clean Catch     Status: Abnormal   Collection Time: 06/22/22 11:38 PM  Result Value Ref Range   Color, Urine COLORLESS (A) YELLOW   APPearance CLEAR CLEAR   Specific Gravity, Urine 1.016 1.005 - 1.030   pH 7.0 5.0 - 8.0   Glucose, UA NEGATIVE NEGATIVE mg/dL   Hgb urine dipstick SMALL (A) NEGATIVE   Bilirubin Urine NEGATIVE NEGATIVE   Ketones, ur 15 (A) NEGATIVE mg/dL   Protein, ur NEGATIVE NEGATIVE mg/dL   Nitrite NEGATIVE NEGATIVE   Leukocytes,Ua NEGATIVE NEGATIVE   RBC / HPF 6-10 0 - 5 RBC/hpf   WBC, UA 0-5 0 - 5 WBC/hpf  Pregnancy, urine     Status: None   Collection Time: 06/22/22 11:38 PM  Result Value Ref Range   Preg Test, Ur NEGATIVE NEGATIVE  D-dimer, quantitative     Status: None   Collection Time: 06/23/22  2:58 AM  Result Value Ref Range   D-Dimer, Quant <0.27 0.00 - 0.50 ug/mL-FEU  Wet prep, genital     Status: Abnormal   Collection Time: 06/23/22  3:35 AM   Specimen: Cervical / Endocervical swab  Result Value Ref Range   Yeast Wet Prep HPF POC NONE SEEN NONE  SEEN   Trich, Wet Prep NONE SEEN NONE SEEN   Clue Cells Wet Prep HPF POC NONE SEEN NONE SEEN   WBC, Wet Prep HPF POC >=10 (A) <10   Sperm NONE SEEN    Imaging Studies: DG Chest 2 View  Result Date: 06/23/2022 CLINICAL DATA:  Right shoulder and back pain. EXAM: CHEST - 2 VIEW COMPARISON:  PA Lat 05/21/2022 FINDINGS:  The heart size and mediastinal contours are within normal limits. Both lungs are clear. The visualized skeletal structures are unremarkable. IMPRESSION: No active cardiopulmonary disease.  Stable chest. Electronically Signed   By: Almira BarKeith  Chesser M.D.   On: 06/23/2022 03:09   US Abdomen Limited RUQ (LIVER/GB)  Result Date: 06/23/2022 CLINICAL DATA:  Right upper quadrant pain EXAM: ULTRASOUND ABDOMEN LIMITED RIGHT UPPER QUADRANT COMPARISON:  None Available. FINDINGS: Gallbladder: No gallstones or wall thickening visualized. No sonographic Murphy sign noted by sonographer. Common bile duct: Diameter: 3.5 mm Liver: No focal lesion identified. Within normal limits in parenchymal echogenicity. Portal vein is patent on color Doppler imaging with normal direction of blood flow towards the liver. Other: None. IMPRESSION: Unremarkable right upper quadrant ultrasound. Electronically Signed   By: Alcide CleverMark  Lukens M.D.   On: 06/23/2022 02:31    ED COURSE and MDM  Nursing notes, initial and subsequent vitals signs, including pulse oximetry, reviewed and interpreted by myself.  Vitals:   06/23/22 0130 06/23/22 0200 06/23/22 0315 06/23/22 0400  BP: 102/68 96/63 106/69 101/69  Pulse: 97 91 92 97  Resp: 18 18 18 18   Temp:      TempSrc:      SpO2: 98% 97% 99% 98%  Weight:      Height:       Medications  ketorolac (TORADOL) 15 MG/ML injection 15 mg (has no administration in time range)  sodium chloride 0.9 % bolus 1,000 mL (0 mLs Intravenous Stopped 06/23/22 0317)  ondansetron (ZOFRAN) injection 4 mg (4 mg Intravenous Given 06/23/22 0135)  metoCLOPramide (REGLAN) injection 10 mg (10 mg Intravenous Given 06/23/22 0135)  fentaNYL (SUBLIMAZE) injection 100 mcg (100 mcg Intravenous Given 06/23/22 0136)  famotidine (PEPCID) IVPB 20 mg premix (0 mg Intravenous Stopped 06/23/22 0221)   3:34 AM Although the patient is having right upper quadrant tenderness her biliary ultrasound is unremarkable.  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  In [place] syndrome the pelvic exam is usually unremarkable and biliary ultrasound is also usually unremarkable.  A D-dimer is pending, but in [place]itz-Hugh Curtis syndrome this is often elevated.  Swabs have been sent for gonorrhea and Chlamydia testing.  4:26 AM The patient's right upper quadrant pain has nearly abated she is no longer tender in the quite upper quadrant.  She is still having pain in her right shoulder, worse with movement.  I am less concerned about Fitz-Hugh Curtis syndrome at this time.  Also Lynnae JanuaryFitz-Hugh Curtis syndrome is not usually associated with nausea, vomiting and diarrhea.  I suspect her abdominal pain was more likely associated with an acute gastrointestinal virus.  The shoulder pain seems musculoskeletal.  If her gonorrhea or chlamydia testing come back positive we will have her return for appropriate treatment.  She has had no vomiting or diarrhea in the ED and is ready to go home at this time.   PROCEDURES  Procedures   ED DIAGNOSES     ICD-10-CM   1.  Gastroenteritis  K52.9     2. Acute pain of right shoulder  M25.511          Heydi Swango, Jonny Ruiz, MD 06/23/22 [phone]

## 2022-06-24 LAB — GC/CHLAMYDIA PROBE AMP (~~LOC~~) NOT AT ARMC
Chlamydia: NEGATIVE
Comment: NEGATIVE
Comment: NORMAL
Neisseria Gonorrhea: NEGATIVE

## 2022-07-05 DIAGNOSIS — M62838 Other muscle spasm: Secondary | ICD-10-CM | POA: Diagnosis not present

## 2022-07-05 DIAGNOSIS — S39012A Strain of muscle, fascia and tendon of lower back, initial encounter: Secondary | ICD-10-CM | POA: Diagnosis not present

## 2022-07-09 ENCOUNTER — Emergency Department (HOSPITAL_BASED_OUTPATIENT_CLINIC_OR_DEPARTMENT_OTHER)
Admission: EM | Admit: 2022-07-09 | Discharge: 2022-07-10 | Disposition: A | Discharge status: Home / Self Care | Payer: BC Managed Care – PPO | Attending: Emergency Medicine | Admitting: Emergency Medicine

## 2022-07-09 ENCOUNTER — Other Ambulatory Visit: Payer: Self-pay

## 2022-07-09 ENCOUNTER — Encounter (HOSPITAL_BASED_OUTPATIENT_CLINIC_OR_DEPARTMENT_OTHER): Payer: Self-pay | Admitting: Emergency Medicine

## 2022-07-09 DIAGNOSIS — R109 Unspecified abdominal pain: Secondary | ICD-10-CM | POA: Diagnosis not present

## 2022-07-09 DIAGNOSIS — R1031 Right lower quadrant pain: Secondary | ICD-10-CM | POA: Diagnosis not present

## 2022-07-09 DIAGNOSIS — R079 Chest pain, unspecified: Secondary | ICD-10-CM | POA: Insufficient documentation

## 2022-07-09 DIAGNOSIS — R112 Nausea with vomiting, unspecified: Secondary | ICD-10-CM | POA: Diagnosis not present

## 2022-07-09 DIAGNOSIS — J45909 Unspecified asthma, uncomplicated: Secondary | ICD-10-CM | POA: Insufficient documentation

## 2022-07-09 DIAGNOSIS — Z20822 Contact with and (suspected) exposure to covid-19: Secondary | ICD-10-CM | POA: Diagnosis not present

## 2022-07-09 LAB — CBC WITH DIFFERENTIAL/PLATELET
Abs Immature Granulocytes: 0.02 10*3/uL (ref 0.00–0.07)
Basophils Absolute: 0 10*3/uL (ref 0.0–0.1)
Basophils Relative: 0 %
Eosinophils Absolute: 0.2 10*3/uL (ref 0.0–0.5)
Eosinophils Relative: 2 %
HCT: 37 % (ref 36.0–46.0)
Hemoglobin: 11.5 g/dL — ABNORMAL LOW (ref 12.0–15.0)
Immature Granulocytes: 0 %
Lymphocytes Relative: 33 %
Lymphs Abs: 2.9 10*3/uL (ref 0.7–4.0)
MCH: 26 pg (ref 26.0–34.0)
MCHC: 31.1 g/dL (ref 30.0–36.0)
MCV: 83.5 fL (ref 80.0–100.0)
Monocytes Absolute: 0.6 10*3/uL (ref 0.1–1.0)
Monocytes Relative: 7 %
Neutro Abs: 5.1 10*3/uL (ref 1.7–7.7)
Neutrophils Relative %: 58 %
Platelets: 212 10*3/uL (ref 150–400)
RBC: 4.43 MIL/uL (ref 3.87–5.11)
RDW: 14 % (ref 11.5–15.5)
WBC: 8.8 10*3/uL (ref 4.0–10.5)
nRBC: 0 % (ref 0.0–0.2)

## 2022-07-09 LAB — COMPREHENSIVE METABOLIC PANEL
ALT: 14 U/L (ref 0–44)
AST: 36 U/L (ref 15–41)
Albumin: 4.1 g/dL (ref 3.5–5.0)
Alkaline Phosphatase: 56 U/L (ref 38–126)
Anion gap: 7 (ref 5–15)
BUN: 7 mg/dL (ref 6–20)
CO2: 23 mmol/L (ref 22–32)
Calcium: 8.8 mg/dL — ABNORMAL LOW (ref 8.9–10.3)
Chloride: 109 mmol/L (ref 98–111)
Creatinine, Ser: 0.83 mg/dL (ref 0.44–1.00)
GFR, Estimated: >60 mL/min (ref >60)
Glucose, Bld: 141 mg/dL — ABNORMAL HIGH (ref 70–99)
Potassium: 4 mmol/L (ref 3.5–5.1)
Sodium: 139 mmol/L (ref 135–145)
Total Bilirubin: 1.3 mg/dL — ABNORMAL HIGH (ref 0.3–1.2)
Total Protein: 7.2 g/dL (ref 6.5–8.1)

## 2022-07-09 LAB — TROPONIN I (HIGH SENSITIVITY): Troponin I (High Sensitivity): <2 ng/L (ref <18)

## 2022-07-09 MED ORDER — ONDANSETRON HCL 4 MG/2ML IJ SOLN
4.0000 mg | Freq: Once | INTRAMUSCULAR | Status: AC
  Administered 2022-07-10: 4 mg via INTRAVENOUS
  Filled 2022-07-09: qty 2

## 2022-07-09 MED ORDER — KETOROLAC TROMETHAMINE 15 MG/ML IJ SOLN
15.0000 mg | Freq: Once | INTRAMUSCULAR | Status: AC
  Administered 2022-07-10: 15 mg via INTRAVENOUS
  Filled 2022-07-09: qty 1

## 2022-07-09 MED ORDER — SODIUM CHLORIDE 0.9 % IV BOLUS
1000.0000 mL | Freq: Once | INTRAVENOUS | Status: AC
  Administered 2022-07-10: 1000 mL via INTRAVENOUS

## 2022-07-09 NOTE — ED Triage Notes (Signed)
Patient states she was discharged from [facility] 6 hours ago. Patient is c/o increased chest pain and vomiting since she has been discharged. States she had a kidney stone and is unsure if she passed it or not.

## 2022-07-10 ENCOUNTER — Emergency Department (HOSPITAL_BASED_OUTPATIENT_CLINIC_OR_DEPARTMENT_OTHER): Payer: BC Managed Care – PPO

## 2022-07-10 DIAGNOSIS — R109 Unspecified abdominal pain: Secondary | ICD-10-CM | POA: Diagnosis not present

## 2022-07-10 DIAGNOSIS — R079 Chest pain, unspecified: Secondary | ICD-10-CM | POA: Diagnosis not present

## 2022-07-10 LAB — PREGNANCY, URINE: Preg Test, Ur: NEGATIVE

## 2022-07-10 LAB — URINALYSIS, ROUTINE W REFLEX MICROSCOPIC
Bilirubin Urine: NEGATIVE
Glucose, UA: NEGATIVE mg/dL
Ketones, ur: NEGATIVE mg/dL
Leukocytes,Ua: NEGATIVE
Nitrite: NEGATIVE
Protein, ur: NEGATIVE mg/dL
Specific Gravity, Urine: 1.015 (ref 1.005–1.030)
pH: 6.5 (ref 5.0–8.0)

## 2022-07-10 LAB — URINALYSIS, MICROSCOPIC (REFLEX)

## 2022-07-10 LAB — LIPASE, BLOOD: Lipase: 34 U/L (ref 11–51)

## 2022-07-10 MED ORDER — KETOROLAC TROMETHAMINE 10 MG PO TABS: 10.0000 mg | ORAL_TABLET | Freq: Four times a day (QID) | ORAL | 0 refills | Status: DC | PRN

## 2022-07-10 NOTE — Discharge Instructions (Addendum)
You were evaluated in the Emergency Department and after careful evaluation, we did not find any emergent condition requiring admission or further testing in the hospital.  Your exam/testing today is overall reassuring.  Pain may be due to recent kidney stone or musculoskeletal pain.  Use the ketorolac as needed, follow-up with your regular doctor.  Please return to the Emergency Department if you experience any worsening of your condition.   Thank you for allowing [place] to be a part of your care.

## 2022-07-10 NOTE — ED Provider Notes (Signed)
[facility]ENC[facility]PT [facility] Emergency Department Provider Note MRN:  [id number]  Arrival date & time: 07/10/22     Chief Complaint   Flank pain History of Present Illness   Stefanie Lucero is a 31 y.o. year-old female with a history of anxiety, asthma presenting to the ED with chief complaint of flank pain.  Pain to the right flank associated with nausea and vomiting.  Having some burning chest pain as well.  Was at an outside hospital and told that she may have had a kidney stone that recently passed.  She has been having blood in her urine.  No fever.  Review of Systems  A thorough review of systems was obtained and all systems are negative except as noted in the HPI and PMH.   Patient's Health History    Past Medical History:  Diagnosis Date   Anxiety    Asthma    Eosinophilic esophagitis    Gastric ulcer    Gastritis    Headaches, cluster    Migraine    Pancreatic mass    Vertigo     Past Surgical History:  Procedure Laterality Date   CESAREAN SECTION     x 2   ESOPHAGEAL MANOMETRY N/A 10/07/2021   Procedure: ESOPHAGEAL MANOMETRY (EM);  Surgeon: Kathi Der, MD;  Location: [facility];  Service: Gastroenterology;  Laterality: N/A;   INTRAUTERINE DEVICE (IUD) INSERTION     mirena inserted 12-07-2019   PH IMPEDANCE STUDY  10/07/2021   Procedure: PH IMPEDANCE STUDY;  Surgeon: Kathi Der, MD;  Location: [facility];  Service: Gastroenterology;;   TONSILLECTOMY  2001    Family History  Problem Relation Age of Onset   Hypertension Mother    Stroke Father    Heart attack Father        times 2   Hypertension Father    Dementia Maternal Grandmother    Alzheimer's disease Maternal Grandmother    Irregular heart beat Maternal Grandmother    Stroke Maternal Grandfather    Gallbladder disease Maternal Grandfather    Colon cancer Paternal Grandfather 95            Social History   Socioeconomic History   Marital status: Single    Spouse  name: Not on file   Number of children: Not on file   Years of education: Not on file   Highest education level: Not on file  Occupational History   Not on file  Tobacco Use   Smoking status: Never   Smokeless tobacco: Never  Vaping Use   Vaping Use: Never used  Substance and Sexual Activity   Alcohol use: Yes    Comment: occ   Drug use: No   Sexual activity: Yes    Partners: Male    Birth control/protection: Condom    Comment: 1st intercourse- 19, partners- 5, current partner- 4 yrs   Other Topics Concern   Not on file  Social History Narrative   Not on file   Social Determinants of Health   Financial Resource Strain: Not on file  Food Insecurity: Not on file  Transportation Needs: Not on file  Physical Activity: Not on file  Stress: Not on file  Social Connections: Not on file  Intimate Partner Violence: Not on file     Physical Exam   Vitals:   07/09/22 2304 07/10/22 0052  BP: (!) 147/99 108/72  Pulse: (!) 108 86  Resp: (!) 22 18  Temp: 98.1 F (36.7 C) 97.6 F (  36.4 C)  SpO2: 100% 100%    CONSTITUTIONAL: Well-appearing, NAD NEURO/PSYCH:  Alert and oriented x 3, no focal deficits EYES:  eyes equal and reactive ENT/NECK:  no LAD, no JVD CARDIO: Regular rate, well-perfused, normal S1 and S2 PULM:  CTAB no wheezing or rhonchi GI/GU:  non-distended, non-tender MSK/SPINE:  No gross deformities, no edema SKIN:  no rash, atraumatic   *Additional and/or pertinent findings included in MDM below  Diagnostic and Interventional Summary    EKG Interpretation  Date/Time:    Ventricular Rate:    PR Interval:    QRS Duration:   QT Interval:    QTC Calculation:   R Axis:     Text Interpretation:         Labs Reviewed  CBC WITH DIFFERENTIAL/PLATELET - Abnormal; Notable for the following components:      Result Value   Hemoglobin 11.5 (*)    All other components within normal limits  COMPREHENSIVE METABOLIC PANEL - Abnormal; Notable for the following  components:   Glucose, Bld 141 (*)    Calcium 8.8 (*)    Total Bilirubin 1.3 (*)    All other components within normal limits  URINALYSIS, ROUTINE W REFLEX MICROSCOPIC - Abnormal; Notable for the following components:   Hgb urine dipstick TRACE (*)    All other components within normal limits  URINALYSIS, MICROSCOPIC (REFLEX) - Abnormal; Notable for the following components:   Bacteria, UA RARE (*)    All other components within normal limits  LIPASE, BLOOD  PREGNANCY, URINE  TROPONIN I (HIGH SENSITIVITY)  TROPONIN I (HIGH SENSITIVITY)    DG Abdomen Acute W/Chest  Final Result      Medications  ondansetron (ZOFRAN) injection 4 mg (4 mg Intravenous Given 07/10/22 0020)  sodium chloride 0.9 % bolus 1,000 mL (1,000 mLs Intravenous New Bag/Given 07/10/22 0022)  ketorolac (TORADOL) 15 MG/ML injection 15 mg (15 mg Intravenous Given 07/10/22 0020)     Procedures  /  Critical Care Procedures  ED Course and Medical Decision Making  Initial Impression and Ddx Continued right flank pain, hematuria, suspicious for kidney stone.  Patient explains she had a CT scan at outside hospital earlier today and maybe she had a recently passed stone.  She also explains that she has a herniated disc in her back and so this may be causing her pain.  She has no numbness or weakness to the arms or legs, no bowel or bladder dysfunction, nothing to suggest myelopathy.  Her abdomen is soft and nontender, vital signs reassuring.  Providing ketorolac, screening labs, plain film, will reassess.  Past medical/surgical history that increases complexity of ED encounter: Anxiety  Interpretation of Diagnostics I personally reviewed the Chest Xray and abdomen x-ray and my interpretation is as follows: Normal-appearing no pneumothorax or opacity, no free air in the abdomen, no obstructive findings  Labs are reassuring with no significant blood count or electrolyte disturbance, urinalysis with some blood but no  significant evidence of infection.  Patient Reassessment and Ultimate Disposition/Management     Patient feeling much better after ketorolac, with reassuring workup and no signs of emergent process she is appropriate for discharge with return precautions.  Patient management required discussion with the following services or consulting groups:  None  Complexity of Problems Addressed Acute illness or injury that poses threat of life of bodily function  Additional Data Reviewed and Analyzed Further history obtained from: None  Additional Factors Impacting ED Encounter Risk Prescriptions  Elmer Sow. Pilar Plate, MD  [facility] Emergency Medicine [facility] [email or web address]  Final Clinical Impressions(s) / ED Diagnoses     ICD-10-CM   1. Flank pain  R10.9       ED Discharge Orders          Ordered    ketorolac (TORADOL) 10 MG tablet  Every 6 hours PRN        07/10/22 0137             Discharge Instructions Discussed with and Provided to Patient:    Discharge Instructions      You were evaluated in the Emergency Department and after careful evaluation, we did not find any emergent condition requiring admission or further testing in the hospital.  Your exam/testing today is overall reassuring.  Pain may be due to recent kidney stone or musculoskeletal pain.  Use the ketorolac as needed, follow-up with your regular doctor.  Please return to the Emergency Department if you experience any worsening of your condition.   Thank you for allowing [place] to be a part of your care.      Sabas Sous, MD 07/10/22 [phone]

## 2022-07-14 DIAGNOSIS — R3129 Other microscopic hematuria: Secondary | ICD-10-CM | POA: Diagnosis not present

## 2022-07-14 DIAGNOSIS — M545 Low back pain, unspecified: Secondary | ICD-10-CM | POA: Diagnosis not present

## 2022-07-14 DIAGNOSIS — K5900 Constipation, unspecified: Secondary | ICD-10-CM | POA: Diagnosis not present

## 2022-07-14 DIAGNOSIS — R1031 Right lower quadrant pain: Secondary | ICD-10-CM | POA: Diagnosis not present

## 2022-07-18 DIAGNOSIS — N83201 Unspecified ovarian cyst, right side: Secondary | ICD-10-CM | POA: Diagnosis not present

## 2022-07-18 DIAGNOSIS — Z3202 Encounter for pregnancy test, result negative: Secondary | ICD-10-CM | POA: Diagnosis not present

## 2022-07-18 DIAGNOSIS — N83291 Other ovarian cyst, right side: Secondary | ICD-10-CM | POA: Diagnosis not present

## 2022-07-18 DIAGNOSIS — R1031 Right lower quadrant pain: Secondary | ICD-10-CM | POA: Diagnosis not present

## 2022-07-20 DIAGNOSIS — R1030 Lower abdominal pain, unspecified: Secondary | ICD-10-CM | POA: Diagnosis not present

## 2022-07-20 DIAGNOSIS — R079 Chest pain, unspecified: Secondary | ICD-10-CM | POA: Diagnosis not present

## 2022-07-20 DIAGNOSIS — R1031 Right lower quadrant pain: Secondary | ICD-10-CM | POA: Diagnosis not present

## 2022-07-20 DIAGNOSIS — R109 Unspecified abdominal pain: Secondary | ICD-10-CM | POA: Diagnosis not present

## 2022-07-20 DIAGNOSIS — R509 Fever, unspecified: Secondary | ICD-10-CM | POA: Diagnosis not present

## 2022-07-20 DIAGNOSIS — N83291 Other ovarian cyst, right side: Secondary | ICD-10-CM | POA: Diagnosis not present

## 2022-07-21 DIAGNOSIS — R1031 Right lower quadrant pain: Secondary | ICD-10-CM | POA: Diagnosis not present

## 2022-09-25 ENCOUNTER — Emergency Department (HOSPITAL_COMMUNITY): Payer: BC Managed Care – PPO

## 2022-09-25 ENCOUNTER — Emergency Department (HOSPITAL_COMMUNITY)
Admission: EM | Admit: 2022-09-25 | Discharge: 2022-09-26 | Disposition: A | Discharge status: Home / Self Care | Payer: BC Managed Care – PPO | Attending: Emergency Medicine | Admitting: Emergency Medicine

## 2022-09-25 ENCOUNTER — Other Ambulatory Visit: Payer: Self-pay

## 2022-09-25 ENCOUNTER — Encounter (HOSPITAL_COMMUNITY): Payer: Self-pay | Admitting: *Deleted

## 2022-09-25 DIAGNOSIS — G90A Postural orthostatic tachycardia syndrome (POTS): Secondary | ICD-10-CM | POA: Insufficient documentation

## 2022-09-25 DIAGNOSIS — R079 Chest pain, unspecified: Secondary | ICD-10-CM | POA: Diagnosis present

## 2022-09-25 DIAGNOSIS — Z91040 Latex allergy status: Secondary | ICD-10-CM | POA: Insufficient documentation

## 2022-09-25 DIAGNOSIS — R55 Syncope and collapse: Secondary | ICD-10-CM | POA: Insufficient documentation

## 2022-09-25 DIAGNOSIS — R0789 Other chest pain: Secondary | ICD-10-CM

## 2022-09-25 LAB — BASIC METABOLIC PANEL
Anion gap: 7 (ref 5–15)
BUN: 8 mg/dL (ref 6–20)
CO2: 24 mmol/L (ref 22–32)
Calcium: 8.6 mg/dL — ABNORMAL LOW (ref 8.9–10.3)
Chloride: 106 mmol/L (ref 98–111)
Creatinine, Ser: 0.69 mg/dL (ref 0.44–1.00)
GFR, Estimated: >60 mL/min (ref >60)
Glucose, Bld: 120 mg/dL — ABNORMAL HIGH (ref 70–99)
Potassium: 3.5 mmol/L (ref 3.5–5.1)
Sodium: 137 mmol/L (ref 135–145)

## 2022-09-25 LAB — CBC
HCT: 36 % (ref 36.0–46.0)
Hemoglobin: 11.3 g/dL — ABNORMAL LOW (ref 12.0–15.0)
MCH: 25.8 pg — ABNORMAL LOW (ref 26.0–34.0)
MCHC: 31.4 g/dL (ref 30.0–36.0)
MCV: 82.2 fL (ref 80.0–100.0)
Platelets: 293 10*3/uL (ref 150–400)
RBC: 4.38 MIL/uL (ref 3.87–5.11)
RDW: 14.4 % (ref 11.5–15.5)
WBC: 11.6 10*3/uL — ABNORMAL HIGH (ref 4.0–10.5)
nRBC: 0 % (ref 0.0–0.2)

## 2022-09-25 LAB — TROPONIN I (HIGH SENSITIVITY): Troponin I (High Sensitivity): <2 ng/L (ref <18)

## 2022-09-25 MED ORDER — SODIUM CHLORIDE 0.9 % IV BOLUS
1000.0000 mL | Freq: Once | INTRAVENOUS | Status: AC
  Administered 2022-09-25: 1000 mL via INTRAVENOUS

## 2022-09-25 NOTE — ED Provider Notes (Signed)
[place] Provider Note   CSN: [id number] Arrival date & time: 09/25/22  2224     History {Add pertinent medical, surgical, social history, OB history to HPI:1} Chief Complaint  Patient presents with   Chest Pain   Dizziness    Stefanie Lucero is a 32 y.o. female.  Patient presents to the emergency department for evaluation of high heart rate with left-sided chest, abdomen and arm pain.  Patient feels weak and dizzy.  Patient reports that she has a history of POTS.  She did have an episode of diarrhea earlier and then felt like her heart was racing.  She took a shower when she came out of the shower she felt very dizzy, nearly passed out.  She did fall up against the wall, hit her head on the tile rack.  No loss of consciousness.  She has been having intermittent episodes of pain in the left upper chest, left shoulder, left arm.  This has been occurring, even before she fell.  She has never had any chest pains with her high heart rates and POTS.       Home Medications Prior to Admission medications   Medication Sig Start Date End Date Taking? Authorizing Provider  acetaminophen (TYLENOL) 500 MG tablet Take 1,000 mg by mouth every 6 (six) hours as needed for moderate pain or headache.    [provider]  albuterol (VENTOLIN HFA) 108 (90 Base) MCG/ACT inhaler Inhale 2 puffs into the lungs every 4 (four) hours as needed for wheezing or shortness of breath. 11/06/20   [provider]  cetirizine (ZYRTEC ALLERGY) 10 MG tablet Take 1 tablet (10 mg total) by mouth daily. 12/23/21   Jaynee Eagles, PA-C  clonazePAM (KLONOPIN) 0.5 MG tablet Take by mouth. 12/31/21   [provider]  diazepam (VALIUM) 5 MG tablet Take 0.5-1 tablets (2.5-5 mg total) by mouth every 6 (six) hours as needed (for vertigo). 12/08/21   Molpus, John, MD  diclofenac Sodium (VOLTAREN) 1 % GEL Apply 2 g topically 4 (four) times daily as needed. 06/16/21    Long, Wonda Olds, MD  dicyclomine (BENTYL) 20 MG tablet Take 1 tablet (20 mg total) by mouth 2 (two) times daily. 01/31/22   Dorie Rank, MD  DULoxetine (CYMBALTA) 60 MG capsule Take 60 mg by mouth daily. 12/31/21   [provider]  famotidine (PEPCID) 20 MG tablet Take 1 tablet (20 mg total) by mouth 2 (two) times daily. 09/14/21   Palumbo, April, MD  FLUoxetine (PROZAC) 10 MG capsule Take 10 mg by mouth daily. 11/09/21   [provider]  fluticasone (FLONASE) 50 MCG/ACT nasal spray Place 2 sprays into both nostrils daily. 12/23/21   Jaynee Eagles, PA-C  gabapentin (NEURONTIN) 100 MG capsule Take 100 mg by mouth 3 (three) times daily as needed. 11/09/21   [provider]  Galcanezumab-gnlm (EMGALITY) 120 MG/ML SOAJ Inject 120 mg into the skin every 28 (twenty-eight) days. 11/24/21   Pieter Partridge, DO  ketorolac (TORADOL) 10 MG tablet Take 1 tablet (10 mg total) by mouth every 6 (six) hours as needed. 07/10/22   Maudie Flakes, MD  lansoprazole (PREVACID) 30 MG capsule TAKE 1 CAPSULE BY MOUTH TWICE DAILY BEFORE A MEAL 05/25/21   [provider]  metoCLOPramide (REGLAN) 10 MG tablet Take 1 tablet (10 mg total) by mouth every 6 (six) hours as needed for nausea or vomiting. 06/23/22   Molpus, Jenny Reichmann, MD  metoprolol tartrate (  LOPRESSOR) 25 MG tablet Take by mouth. 02/22/22 05/23/22  [provider]  norethindrone-ethinyl estradiol-FE (LOESTRIN FE) 1-20 MG-MCG tablet Take 1 tablet by mouth daily. 02/05/22   Tamela Gammon, NP  ondansetron (ZOFRAN-ODT) 8 MG disintegrating tablet Take 1 tablet (8 mg total) by mouth every 8 (eight) hours as needed for nausea or vomiting. 12/23/21   Jaynee Eagles, PA-C  pantoprazole (PROTONIX) 20 MG tablet Take 1 tablet (20 mg total) by mouth daily. 11/25/21 12/25/21  Mickie Hillier, PA-C  pseudoephedrine (SUDAFED) 30 MG tablet Take 1 tablet (30 mg total) by mouth every 8 (eight) hours as needed for congestion. 12/23/21   Jaynee Eagles, PA-C       Allergies    Eggs or egg-derived products, Iodinated contrast media, Iodine, Latex, Nutmeg oil (myristica oil), Protonix [pantoprazole], Cefdinir, Cefuroxime, Guggulipid-black pepper, Nickel, and Other    Review of Systems   Review of Systems  Physical Exam Updated Vital Signs BP 117/87   Pulse (!) 104   Temp 97.6 F (36.4 C) (Oral)   Resp (!) 21   Ht '5\' 3"'$  (1.6 m)   Wt 83.9 kg   LMP 09/04/2022   SpO2 100%   BMI 32.77 kg/m  Physical Exam Vitals and nursing note reviewed.  Constitutional:      General: She is not in acute distress.    Appearance: She is well-developed.  HENT:     Head: Normocephalic and atraumatic.     Mouth/Throat:     Mouth: Mucous membranes are moist.  Eyes:     General: Vision grossly intact. Gaze aligned appropriately.     Extraocular Movements: Extraocular movements intact.     Conjunctiva/sclera: Conjunctivae normal.  Cardiovascular:     Rate and Rhythm: Regular rhythm. Tachycardia present.     Pulses: Normal pulses.     Heart sounds: Normal heart sounds, S1 normal and S2 normal. No murmur heard.    No friction rub. No gallop.  Pulmonary:     Effort: Pulmonary effort is normal. No respiratory distress.     Breath sounds: Normal breath sounds.  Abdominal:     General: Bowel sounds are normal.     Palpations: Abdomen is soft.     Tenderness: There is no abdominal tenderness. There is no guarding or rebound.     Hernia: No hernia is present.  Musculoskeletal:        General: No swelling.     Cervical back: Full passive range of motion without pain, normal range of motion and neck supple. No spinous process tenderness or muscular tenderness. Normal range of motion.     Right lower leg: No edema.     Left lower leg: No edema.  Skin:    General: Skin is warm and dry.     Capillary Refill: Capillary refill takes less than 2 seconds.     Findings: No ecchymosis, erythema, rash or wound.  Neurological:     General: No focal deficit present.      Mental Status: She is alert and oriented to person, place, and time.     GCS: GCS eye subscore is 4. GCS verbal subscore is 5. GCS motor subscore is 6.     Cranial Nerves: Cranial nerves 2-12 are intact.     Sensory: Sensation is intact.     Motor: Motor function is intact.     Coordination: Coordination is intact.  Psychiatric:        Attention and Perception: Attention normal.  Mood and Affect: Mood normal.        Speech: Speech normal.        Behavior: Behavior normal.     ED Results / Procedures / Treatments   Labs (all labs ordered are listed, but only abnormal results are displayed) Labs Reviewed  BASIC METABOLIC PANEL  CBC  POC URINE PREG, ED  TROPONIN I (HIGH SENSITIVITY)    EKG None  Radiology DG Chest Portable 1 View  Result Date: 09/25/2022 CLINICAL DATA:  Chest pain fell out of the. EXAM: PORTABLE CHEST 1 VIEW COMPARISON:  None Available. FINDINGS: The heart size and mediastinal contours are within normal limits. Both lungs are clear. The visualized skeletal structures are unremarkable. IMPRESSION: No active disease. Electronically Signed   By: Keane Police D.O.   On: 09/25/2022 23:00    Procedures Procedures  {Document cardiac monitor, telemetry assessment procedure when appropriate:1}  Medications Ordered in ED Medications - No data to display  ED Course/ Medical Decision Making/ A&P   {   Click here for ABCD2, HEART and other calculatorsREFRESH Note before signing :1}                          Medical Decision Making  ***  {Document critical care time when appropriate:1} {Document review of labs and clinical decision tools ie heart score, Chads2Vasc2 etc:1}  {Document your independent review of radiology images, and any outside records:1} {Document your discussion with family members, caretakers, and with consultants:1} {Document social determinants of health affecting pt's care:1} {Document your decision making why or why not admission,  treatments were needed:1} Final Clinical Impression(s) / ED Diagnoses Final diagnoses:  None    Rx / DC Orders ED Discharge Orders     None

## 2022-09-25 NOTE — ED Notes (Signed)
Pt denies chest pain at this time;, says it stopped right before she got here, says pain is now in left arm, shoulder, and armpit after falling, landing on left shoulder. Pt says she had chest pain prior to falling, then lost her balance, and fell.

## 2022-09-25 NOTE — ED Triage Notes (Addendum)
Pt with diarrhea x 3 hours per pt, pt became dizzy with left sided CP, pt slipped while in shower and hit her head on towel rack, pt unsure of LOC.  Pt states she vomited x 1 immediately after.  Pt took Zofran, tylenol and klonopin PTA. Pt with hx of POTS

## 2022-09-26 LAB — POC URINE PREG, ED: Preg Test, Ur: NEGATIVE

## 2022-09-26 LAB — TROPONIN I (HIGH SENSITIVITY): Troponin I (High Sensitivity): <2 ng/L (ref <18)

## 2022-09-26 NOTE — ED Notes (Signed)
Pt ambulatory from room to bathroom and back to room with no problem

## 2022-09-26 NOTE — ED Notes (Signed)
Called lab to enquire on 2nd trop as it has not been collected yet, Roselyn Reef reports calling phlebotomist now

## 2022-09-26 NOTE — ED Notes (Signed)
Stefanie Lucero, with lab at bedside now to draw 2nd trop

## 2022-09-26 NOTE — ED Notes (Signed)
Lab called to verify if 2nd trop was still needed- informed yes, still needed-  lab reports on way to draw now

## 2022-09-29 ENCOUNTER — Ambulatory Visit: Payer: BC Managed Care – PPO | Admitting: Nurse Practitioner

## 2022-11-15 ENCOUNTER — Ambulatory Visit: Payer: BC Managed Care – PPO | Admitting: Nurse Practitioner

## 2023-01-11 ENCOUNTER — Encounter (HOSPITAL_BASED_OUTPATIENT_CLINIC_OR_DEPARTMENT_OTHER): Payer: Self-pay

## 2023-01-11 ENCOUNTER — Emergency Department (HOSPITAL_BASED_OUTPATIENT_CLINIC_OR_DEPARTMENT_OTHER): Payer: BC Managed Care – PPO

## 2023-01-11 ENCOUNTER — Other Ambulatory Visit: Payer: Self-pay

## 2023-01-11 ENCOUNTER — Emergency Department (HOSPITAL_BASED_OUTPATIENT_CLINIC_OR_DEPARTMENT_OTHER)
Admission: EM | Admit: 2023-01-11 | Discharge: 2023-01-11 | Disposition: A | Discharge status: Home / Self Care | Payer: BC Managed Care – PPO | Attending: Emergency Medicine | Admitting: Emergency Medicine

## 2023-01-11 DIAGNOSIS — K529 Noninfective gastroenteritis and colitis, unspecified: Secondary | ICD-10-CM

## 2023-01-11 DIAGNOSIS — J45909 Unspecified asthma, uncomplicated: Secondary | ICD-10-CM | POA: Insufficient documentation

## 2023-01-11 DIAGNOSIS — Z79899 Other long term (current) drug therapy: Secondary | ICD-10-CM | POA: Insufficient documentation

## 2023-01-11 DIAGNOSIS — R1033 Periumbilical pain: Secondary | ICD-10-CM | POA: Diagnosis present

## 2023-01-11 DIAGNOSIS — Z91040 Latex allergy status: Secondary | ICD-10-CM | POA: Insufficient documentation

## 2023-01-11 DIAGNOSIS — Z7951 Long term (current) use of inhaled steroids: Secondary | ICD-10-CM | POA: Diagnosis not present

## 2023-01-11 HISTORY — DX: Irritable bowel syndrome, unspecified: K58.9

## 2023-01-11 LAB — URINALYSIS, ROUTINE W REFLEX MICROSCOPIC
Bilirubin Urine: NEGATIVE
Glucose, UA: NEGATIVE mg/dL
Ketones, ur: NEGATIVE mg/dL
Leukocytes,Ua: NEGATIVE
Nitrite: NEGATIVE
Protein, ur: NEGATIVE mg/dL
Specific Gravity, Urine: 1.025 (ref 1.005–1.030)
pH: 7 (ref 5.0–8.0)

## 2023-01-11 LAB — CBC WITH DIFFERENTIAL/PLATELET
Abs Immature Granulocytes: 0.04 10*3/uL (ref 0.00–0.07)
Basophils Absolute: 0 10*3/uL (ref 0.0–0.1)
Basophils Relative: 0 %
Eosinophils Absolute: 0.1 10*3/uL (ref 0.0–0.5)
Eosinophils Relative: 1 %
HCT: 42.7 % (ref 36.0–46.0)
Hemoglobin: 14.1 g/dL (ref 12.0–15.0)
Immature Granulocytes: 0 %
Lymphocytes Relative: 22 %
Lymphs Abs: 2.5 10*3/uL (ref 0.7–4.0)
MCH: 28.5 pg (ref 26.0–34.0)
MCHC: 33 g/dL (ref 30.0–36.0)
MCV: 86.3 fL (ref 80.0–100.0)
Monocytes Absolute: 1 10*3/uL (ref 0.1–1.0)
Monocytes Relative: 8 %
Neutro Abs: 8 10*3/uL — ABNORMAL HIGH (ref 1.7–7.7)
Neutrophils Relative %: 69 %
Platelets: 258 10*3/uL (ref 150–400)
RBC: 4.95 MIL/uL (ref 3.87–5.11)
RDW: 14.1 % (ref 11.5–15.5)
WBC: 11.7 10*3/uL — ABNORMAL HIGH (ref 4.0–10.5)
nRBC: 0 % (ref 0.0–0.2)

## 2023-01-11 LAB — COMPREHENSIVE METABOLIC PANEL
ALT: 21 U/L (ref 0–44)
AST: 19 U/L (ref 15–41)
Albumin: 4.3 g/dL (ref 3.5–5.0)
Alkaline Phosphatase: 54 U/L (ref 38–126)
Anion gap: 11 (ref 5–15)
BUN: 10 mg/dL (ref 6–20)
CO2: 22 mmol/L (ref 22–32)
Calcium: 9 mg/dL (ref 8.9–10.3)
Chloride: 104 mmol/L (ref 98–111)
Creatinine, Ser: 0.74 mg/dL (ref 0.44–1.00)
GFR, Estimated: >60 mL/min (ref >60)
Glucose, Bld: 102 mg/dL — ABNORMAL HIGH (ref 70–99)
Potassium: 3.6 mmol/L (ref 3.5–5.1)
Sodium: 137 mmol/L (ref 135–145)
Total Bilirubin: 0.3 mg/dL (ref 0.3–1.2)
Total Protein: 7.5 g/dL (ref 6.5–8.1)

## 2023-01-11 LAB — URINALYSIS, MICROSCOPIC (REFLEX)

## 2023-01-11 LAB — PREGNANCY, URINE: Preg Test, Ur: NEGATIVE

## 2023-01-11 LAB — LIPASE, BLOOD: Lipase: 33 U/L (ref 11–51)

## 2023-01-11 MED ORDER — FENTANYL CITRATE PF 50 MCG/ML IJ SOSY
100.0000 ug | PREFILLED_SYRINGE | Freq: Once | INTRAMUSCULAR | Status: AC
  Administered 2023-01-11: 100 ug via INTRAVENOUS
  Filled 2023-01-11: qty 2

## 2023-01-11 MED ORDER — ONDANSETRON 8 MG PO TBDP: 8.0000 mg | ORAL_TABLET | Freq: Three times a day (TID) | ORAL | 0 refills | Status: DC | PRN

## 2023-01-11 MED ORDER — LACTATED RINGERS IV BOLUS
2000.0000 mL | Freq: Once | INTRAVENOUS | Status: AC
  Administered 2023-01-11: 2000 mL via INTRAVENOUS

## 2023-01-11 MED ORDER — METOCLOPRAMIDE HCL 5 MG/ML IJ SOLN: 10.0000 mg | Freq: Once | INTRAMUSCULAR | Status: DC | PRN

## 2023-01-11 MED ORDER — ONDANSETRON HCL 4 MG/2ML IJ SOLN
4.0000 mg | Freq: Once | INTRAMUSCULAR | Status: AC
  Administered 2023-01-11: 4 mg via INTRAVENOUS
  Filled 2023-01-11: qty 2

## 2023-01-11 MED ORDER — DICYCLOMINE HCL 20 MG PO TABS: 20.0000 mg | ORAL_TABLET | Freq: Four times a day (QID) | ORAL | 0 refills | Status: AC | PRN

## 2023-01-11 NOTE — ED Triage Notes (Signed)
Pt with abd pain x3 days, intermittent fevers, diarrhea, N/V. Pt says she has hx of IBS, but this pain is bad. Denies urinary sx.

## 2023-01-11 NOTE — ED Notes (Signed)
Pt is tolerating water well.  

## 2023-01-11 NOTE — ED Notes (Signed)
Water given to pt 

## 2023-01-11 NOTE — ED Notes (Signed)
Pt not able to provide a stool sample

## 2023-01-11 NOTE — ED Provider Notes (Signed)
[facility] Provider Note: Stefanie Dell, MD, FACEP  CSN: [id number] MRN: [id number] ARRIVAL: 01/11/23 at 0226 ROOM: MH02/MH02   CHIEF COMPLAINT  Abdominal Pain   HISTORY OF PRESENT ILLNESS  01/11/23 2:34 AM Stefanie Lucero is a 32 y.o. female with 3 days of sharp periumbilical pain (8 out of 10), nausea, vomiting and watery diarrhea.  She has not had a fever with this.  She denies urinary symptoms.  She denies vaginal bleeding or discharge.  Her last menstrual period was 12/21/2022.    Past Medical History:  Diagnosis Date   Anxiety    Asthma    Eosinophilic esophagitis    Gastric ulcer    Gastritis    Headaches, cluster    IBS (irritable bowel syndrome)    Migraine    Pancreatic mass    Vertigo     Past Surgical History:  Procedure Laterality Date   CESAREAN SECTION     x 2   ESOPHAGEAL MANOMETRY N/A 10/07/2021   Procedure: ESOPHAGEAL MANOMETRY (EM);  Surgeon: Kathi Der, MD;  Location: [facility];  Service: Gastroenterology;  Laterality: N/A;   INTRAUTERINE DEVICE (IUD) INSERTION     mirena inserted 12-07-2019   PH IMPEDANCE STUDY  10/07/2021   Procedure: PH IMPEDANCE STUDY;  Surgeon: Kathi Der, MD;  Location: [facility];  Service: Gastroenterology;;   TONSILLECTOMY  2001    Family History  Problem Relation Age of Onset   Hypertension Mother    Stroke Father    Heart attack Father        times 2   Hypertension Father    Dementia Maternal Grandmother    Alzheimer's disease Maternal Grandmother    Irregular heart beat Maternal Grandmother    Stroke Maternal Grandfather    Gallbladder disease Maternal Grandfather    Colon cancer Paternal Grandfather 72            Social History   Tobacco Use   Smoking status: Never   Smokeless tobacco: Never  Vaping Use   Vaping Use: Never used  Substance Use Topics   Alcohol use: Yes    Comment: occ   Drug use: No    Prior to Admission medications   Medication Sig Start Date End  Date Taking? Authorizing Provider  acetaminophen (TYLENOL) 500 MG tablet Take 1,000 mg by mouth every 6 (six) hours as needed for moderate pain or headache.    [provider]  albuterol (VENTOLIN HFA) 108 (90 Base) MCG/ACT inhaler Inhale 2 puffs into the lungs every 4 (four) hours as needed for wheezing or shortness of breath. 11/06/20   [provider]  cetirizine (ZYRTEC ALLERGY) 10 MG tablet Take 1 tablet (10 mg total) by mouth daily. 12/23/21   Wallis Bamberg, PA-C  clonazePAM (KLONOPIN) 0.5 MG tablet Take by mouth. 12/31/21   [provider]  diazepam (VALIUM) 5 MG tablet Take 0.5-1 tablets (2.5-5 mg total) by mouth every 6 (six) hours as needed (for vertigo). 12/08/21   Daune Colgate, MD  diclofenac Sodium (VOLTAREN) 1 % GEL Apply 2 g topically 4 (four) times daily as needed. 06/16/21   Long, Arlyss Repress, MD  dicyclomine (BENTYL) 20 MG tablet Take 1 tablet (20 mg total) by mouth every 6 (six) hours as needed (for abdominal cramping). 01/11/23   Nimah Uphoff, MD  DULoxetine (CYMBALTA) 60 MG capsule Take 60 mg by mouth daily. 12/31/21   [provider]  famotidine (PEPCID) 20 MG tablet Take 1 tablet (20 mg  total) by mouth 2 (two) times daily. 09/14/21   Palumbo, April, MD  FLUoxetine (PROZAC) 10 MG capsule Take 10 mg by mouth daily. 11/09/21   [provider]  fluticasone (FLONASE) 50 MCG/ACT nasal spray Place 2 sprays into both nostrils daily. 12/23/21   Wallis Bamberg, PA-C  gabapentin (NEURONTIN) 100 MG capsule Take 100 mg by mouth 3 (three) times daily as needed. 11/09/21   [provider]  Galcanezumab-gnlm (EMGALITY) 120 MG/ML SOAJ Inject 120 mg into the skin every 28 (twenty-eight) days. 11/24/21   Drema Dallas, DO  ketorolac (TORADOL) 10 MG tablet Take 1 tablet (10 mg total) by mouth every 6 (six) hours as needed. 07/10/22   Sabas Sous, MD  lansoprazole (PREVACID) 30 MG capsule TAKE 1 CAPSULE BY MOUTH TWICE DAILY BEFORE A MEAL 05/25/21   [provider]  metoCLOPramide (REGLAN) 10 MG tablet Take 1 tablet (10 mg total) by mouth every 6 (six) hours as needed for nausea or vomiting. 06/23/22   Minervia Osso, MD  metoprolol tartrate (LOPRESSOR) 25 MG tablet Take by mouth. 02/22/22 05/23/22  [provider]  norethindrone-ethinyl estradiol-FE (LOESTRIN FE) 1-20 MG-MCG tablet Take 1 tablet by mouth daily. 02/05/22   Olivia Mackie, NP  ondansetron (ZOFRAN-ODT) 8 MG disintegrating tablet Take 1 tablet (8 mg total) by mouth every 8 (eight) hours as needed for nausea or vomiting. 01/11/23   Ameira Alessandrini, Jonny Ruiz, MD  pantoprazole (PROTONIX) 20 MG tablet Take 1 tablet (20 mg total) by mouth daily. 11/25/21 12/25/21  Cristopher Peru, PA-C  pseudoephedrine (SUDAFED) 30 MG tablet Take 1 tablet (30 mg total) by mouth every 8 (eight) hours as needed for congestion. 12/23/21   Wallis Bamberg, PA-C    Allergies Egg-derived products, Iodinated contrast media, Iodine, Latex, Nutmeg oil (myristica oil), Protonix [pantoprazole], Cefdinir, Cefuroxime, Guggulipid-black pepper, Nickel, and Other   REVIEW OF SYSTEMS  Negative except as noted here or in the History of Present Illness.   PHYSICAL EXAMINATION  Initial Vital Signs Blood pressure (!) 141/98, pulse (!) 115, temperature 98.2 F (36.8 C), temperature source Oral, resp. rate (!) 22, height 5\' 3"  (1.6 m), weight 83.9 kg, last menstrual period 12/21/2022, SpO2 98 %.  Examination General: Well-developed, well-nourished female in no acute distress; appearance consistent with age of record HENT: normocephalic; atraumatic Eyes: Normal appearance Neck: supple Heart: regular rate and rhythm; tachycardia Lungs: clear to auscultation bilaterally Abdomen: soft; nondistended; periumbilical tenderness; bowel sounds hypoactive Extremities: No deformity; full range of motion; pulses normal Neurologic: Awake, alert and oriented; motor function intact in all extremities and symmetric; no facial droop Skin:  Warm and dry Psychiatric: Grimacing   RESULTS  Summary of this visit's results, reviewed and interpreted by myself:   EKG Interpretation  Date/Time:    Ventricular Rate:    PR Interval:    QRS Duration:   QT Interval:    QTC Calculation:   R Axis:     Text Interpretation:         Laboratory Studies: Results for orders placed or performed during the hospital encounter of 01/11/23 (from the past 24 hour(s))  Urinalysis, Routine w reflex microscopic -Urine, Clean Catch     Status: Abnormal   Collection Time: 01/11/23  2:36 AM  Result Value Ref Range   Color, Urine YELLOW YELLOW   APPearance CLEAR CLEAR   Specific Gravity, Urine 1.025 1.005 - 1.030   pH 7.0 5.0 - 8.0   Glucose, UA NEGATIVE NEGATIVE mg/dL   Hgb  urine dipstick TRACE (A) NEGATIVE   Bilirubin Urine NEGATIVE NEGATIVE   Ketones, ur NEGATIVE NEGATIVE mg/dL   Protein, ur NEGATIVE NEGATIVE mg/dL   Nitrite NEGATIVE NEGATIVE   Leukocytes,Ua NEGATIVE NEGATIVE  Urinalysis, Microscopic (reflex)     Status: Abnormal   Collection Time: 01/11/23  2:36 AM  Result Value Ref Range   RBC / HPF 0-5 0 - 5 RBC/hpf   WBC, UA 0-5 0 - 5 WBC/hpf   Bacteria, UA RARE (A) NONE SEEN   Squamous Epithelial / HPF 0-5 0 - 5 /HPF  Pregnancy, urine     Status: None   Collection Time: 01/11/23  2:44 AM  Result Value Ref Range   Preg Test, Ur NEGATIVE NEGATIVE  Lipase, blood     Status: None   Collection Time: 01/11/23  2:55 AM  Result Value Ref Range   Lipase 33 11 - 51 U/L  Comprehensive metabolic panel     Status: Abnormal   Collection Time: 01/11/23  2:55 AM  Result Value Ref Range   Sodium 137 135 - 145 mmol/L   Potassium 3.6 3.5 - 5.1 mmol/L   Chloride 104 98 - 111 mmol/L   CO2 22 22 - 32 mmol/L   Glucose, Bld 102 (H) 70 - 99 mg/dL   BUN 10 6 - 20 mg/dL   Creatinine, Ser 4.09 0.44 - 1.00 mg/dL   Calcium 9.0 8.9 - 81.1 mg/dL   Total Protein 7.5 6.5 - 8.1 g/dL   Albumin 4.3 3.5 - 5.0 g/dL   AST 19 15 - 41 U/L   ALT 21 0 -  44 U/L   Alkaline Phosphatase 54 38 - 126 U/L   Total Bilirubin 0.3 0.3 - 1.2 mg/dL   GFR, Estimated >91 >47 mL/min   Anion gap 11 5 - 15  CBC with Differential     Status: Abnormal   Collection Time: 01/11/23  2:55 AM  Result Value Ref Range   WBC 11.7 (H) 4.0 - 10.5 K/uL   RBC 4.95 3.87 - 5.11 MIL/uL   Hemoglobin 14.1 12.0 - 15.0 g/dL   HCT 82.9 56.2 - 13.0 %   MCV 86.3 80.0 - 100.0 fL   MCH 28.5 26.0 - 34.0 pg   MCHC 33.0 30.0 - 36.0 g/dL   RDW 86.5 78.4 - 69.6 %   Platelets 258 150 - 400 K/uL   nRBC 0.0 0.0 - 0.2 %   Neutrophils Relative % 69 %   Neutro Abs 8.0 (H) 1.7 - 7.7 K/uL   Lymphocytes Relative 22 %   Lymphs Abs 2.5 0.7 - 4.0 K/uL   Monocytes Relative 8 %   Monocytes Absolute 1.0 0.1 - 1.0 K/uL   Eosinophils Relative 1 %   Eosinophils Absolute 0.1 0.0 - 0.5 K/uL   Basophils Relative 0 %   Basophils Absolute 0.0 0.0 - 0.1 K/uL   Immature Granulocytes 0 %   Abs Immature Granulocytes 0.04 0.00 - 0.07 K/uL   Imaging Studies: CT ABDOMEN PELVIS WO CONTRAST  Result Date: 01/11/2023 CLINICAL DATA:  Acute abdominal pain for several days with fevers, initial encounter EXAM: CT ABDOMEN AND PELVIS WITHOUT CONTRAST TECHNIQUE: Multidetector CT imaging of the abdomen and pelvis was performed following the standard protocol without IV contrast. RADIATION DOSE REDUCTION: This exam was performed according to the departmental dose-optimization program which includes automated exposure control, adjustment of the mA and/or kV according to patient size and/or use of iterative reconstruction technique. COMPARISON:  03/04/2022 FINDINGS: Lower chest:  No acute abnormality. Hepatobiliary: No focal liver abnormality is seen. No gallstones, gallbladder wall thickening, or biliary dilatation. Pancreas: Unremarkable. No pancreatic ductal dilatation or surrounding inflammatory changes. Spleen: Normal in size without focal abnormality. Adrenals/Urinary Tract: Adrenal glands are within normal limits.  Kidneys demonstrate no renal calculi or obstructive changes. The bladder is decompressed. Stomach/Bowel: No obstructive or inflammatory changes of the colon are seen. The appendix is within normal limits. No inflammatory changes are seen. Small bowel and stomach are unremarkable. Vascular/Lymphatic: No significant vascular findings are present. No enlarged abdominal or pelvic lymph nodes. Reproductive: Uterus and bilateral adnexa are unremarkable. Other: No abdominal wall hernia or abnormality. No abdominopelvic ascites. Musculoskeletal: No acute or significant osseous findings. IMPRESSION: No acute abnormality noted. Electronically Signed   By: Alcide Clever M.D.   On: 01/11/2023 03:47    ED COURSE and MDM  Nursing notes, initial and subsequent vitals signs, including pulse oximetry, reviewed and interpreted by myself.  Vitals:   01/11/23 0232 01/11/23 0257 01/11/23 0300 01/11/23 0430  BP: (!) 141/98 105/76 113/77 119/82  Pulse: (!) 115 98 89 88  Resp: (!) 22 20 18    Temp: 98.2 F (36.8 C)     TempSrc: Oral     SpO2: 98% 98% 96% 100%  Weight:      Height:       Medications  metoCLOPramide (REGLAN) injection 10 mg (has no administration in time range)  lactated ringers bolus 2,000 mL (0 mLs Intravenous Stopped 01/11/23 0439)  ondansetron (ZOFRAN) injection 4 mg (4 mg Intravenous Given 01/11/23 0252)  fentaNYL (SUBLIMAZE) injection 100 mcg (100 mcg Intravenous Given 01/11/23 0254)   4:23 AM Patient feeling better after IV fluids, Zofran and fentanyl.  CT scan and laboratory studies are reassuring.  No CT evidence of appendicitis, diverticulitis or pancreatitis.  She has a mild leukocytosis which is consistent with an infectious etiology, likely viral.  Patient so far unable to provide a stool specimen for analysis.  4:42 AM Patient able to drink fluids without vomiting.  Will discharge home on Zofran and Bentyl.  PROCEDURES  Procedures   ED DIAGNOSES     ICD-10-CM   1. Gastroenteritis   K52.9          Yena Tisby, MD 01/11/23 [phone]

## 2023-01-26 ENCOUNTER — Ambulatory Visit: Payer: BC Managed Care – PPO | Admitting: Nurse Practitioner

## 2023-02-02 ENCOUNTER — Other Ambulatory Visit: Payer: Self-pay

## 2023-02-02 ENCOUNTER — Emergency Department (HOSPITAL_BASED_OUTPATIENT_CLINIC_OR_DEPARTMENT_OTHER)
Admission: EM | Admit: 2023-02-02 | Discharge: 2023-02-02 | Disposition: A | Discharge status: Home / Self Care | Payer: BC Managed Care – PPO | Attending: Emergency Medicine | Admitting: Emergency Medicine

## 2023-02-02 ENCOUNTER — Emergency Department (HOSPITAL_BASED_OUTPATIENT_CLINIC_OR_DEPARTMENT_OTHER): Payer: BC Managed Care – PPO

## 2023-02-02 ENCOUNTER — Encounter (HOSPITAL_BASED_OUTPATIENT_CLINIC_OR_DEPARTMENT_OTHER): Payer: Self-pay

## 2023-02-02 DIAGNOSIS — R000 Tachycardia, unspecified: Secondary | ICD-10-CM | POA: Diagnosis not present

## 2023-02-02 DIAGNOSIS — R002 Palpitations: Secondary | ICD-10-CM | POA: Insufficient documentation

## 2023-02-02 DIAGNOSIS — Z91040 Latex allergy status: Secondary | ICD-10-CM | POA: Diagnosis not present

## 2023-02-02 LAB — CBC
HCT: 41.7 % (ref 36.0–46.0)
Hemoglobin: 13.8 g/dL (ref 12.0–15.0)
MCH: 28.7 pg (ref 26.0–34.0)
MCHC: 33.1 g/dL (ref 30.0–36.0)
MCV: 86.7 fL (ref 80.0–100.0)
Platelets: 289 10*3/uL (ref 150–400)
RBC: 4.81 MIL/uL (ref 3.87–5.11)
RDW: 13.2 % (ref 11.5–15.5)
WBC: 11.3 10*3/uL — ABNORMAL HIGH (ref 4.0–10.5)
nRBC: 0 % (ref 0.0–0.2)

## 2023-02-02 LAB — PREGNANCY, URINE: Preg Test, Ur: NEGATIVE

## 2023-02-02 LAB — TSH: TSH: 5.687 u[IU]/mL — ABNORMAL HIGH (ref 0.350–4.500)

## 2023-02-02 LAB — BASIC METABOLIC PANEL
Anion gap: 11 (ref 5–15)
BUN: 8 mg/dL (ref 6–20)
CO2: 22 mmol/L (ref 22–32)
Calcium: 9.3 mg/dL (ref 8.9–10.3)
Chloride: 102 mmol/L (ref 98–111)
Creatinine, Ser: 0.72 mg/dL (ref 0.44–1.00)
GFR, Estimated: >60 mL/min (ref >60)
Glucose, Bld: 103 mg/dL — ABNORMAL HIGH (ref 70–99)
Potassium: 3.4 mmol/L — ABNORMAL LOW (ref 3.5–5.1)
Sodium: 135 mmol/L (ref 135–145)

## 2023-02-02 LAB — T4, FREE: Free T4: 0.67 ng/dL (ref 0.61–1.12)

## 2023-02-02 LAB — TROPONIN I (HIGH SENSITIVITY): Troponin I (High Sensitivity): 6 ng/L (ref <18)

## 2023-02-02 LAB — MAGNESIUM: Magnesium: 2 mg/dL (ref 1.7–2.4)

## 2023-02-02 MED ORDER — LACTATED RINGERS IV BOLUS
1000.0000 mL | Freq: Once | INTRAVENOUS | Status: AC
  Administered 2023-02-02: 1000 mL via INTRAVENOUS

## 2023-02-02 NOTE — ED Triage Notes (Signed)
Pt reports sudden onset of heart palpitations tonight around 1930 associated with dizziness, feeling hot, and shortness of breath. She reports mild chest pain. HR 123.

## 2023-02-02 NOTE — ED Provider Notes (Signed)
[place] [facility] Provider Note   CSN: [id number] Arrival date & time: 02/02/23  0013     History  Chief Complaint  Patient presents with   Palpitations    Stefanie Lucero is a 32 y.o. female.  32 year old female who presents the ER today with palpitations and sweating.  Patient states that she has a history of sinus tachycardia and workup by cardiology with multiple test including Zio patch, stress echo, resting echo all of which were negative up until a year ago.  Also has a history of anxiety currently being treated with Zoloft.  She started the Zoloft in April and states that her previous episodes of sinus tachycardia palpitations seem to improve with it.  However tonight around 7:00 she noticed that her heart was beating fast and she checked her heart rate and it was 150-160 consistently.  It improved for a little while and then went back up and improved again.  She went to sleep.  When she went to sleep she was awoken by palpitations again and feeling sweaty.  She is unsure if she has had her thyroid checked recently.  She denies alcohol, drugs or tobacco.  She denies any increased niacin intake or other dietary changes.  Nothing over-the-counter.  Does states she has had diarrhea recently and has been outside and sweating more than normal recently.  Feels that she keeps up with her intake pretty well though.  Tried albuterol earlier tonight which did not seem to help.  Her palpitations were present prior to albuterol.   Palpitations      Home Medications Prior to Admission medications   Medication Sig Start Date End Date Taking? Authorizing Provider  acetaminophen (TYLENOL) 500 MG tablet Take 1,000 mg by mouth every 6 (six) hours as needed for moderate pain or headache.    [provider]  albuterol (VENTOLIN HFA) 108 (90 Base) MCG/ACT inhaler Inhale 2 puffs into the lungs every 4 (four) hours as needed for wheezing or shortness of  breath. 11/06/20   [provider]  dicyclomine (BENTYL) 20 MG tablet Take 1 tablet (20 mg total) by mouth every 6 (six) hours as needed (for abdominal cramping). 01/11/23   Molpus, John, MD      Allergies    Egg-derived products, Iodinated contrast media, Iodine, Latex, Nutmeg oil (myristica oil), Protonix [pantoprazole], Cefdinir, Cefuroxime, Guggulipid-black pepper, Nickel, and Other    Review of Systems   Review of Systems  Cardiovascular:  Positive for palpitations.    Physical Exam Updated Vital Signs BP 102/70   Pulse 86   Temp 98.7 F (37.1 C) (Oral)   Resp 14   Ht 5\' 3"  (1.6 m)   Wt 86.2 kg   LMP 01/18/2023 (Exact Date)   SpO2 98%   BMI 33.66 kg/m  Physical Exam Vitals and nursing note reviewed.  Constitutional:      Appearance: She is well-developed.  HENT:     Head: Normocephalic and atraumatic.     Mouth/Throat:     Mouth: Mucous membranes are moist.  Eyes:     Pupils: Pupils are equal, round, and reactive to light.  Cardiovascular:     Rate and Rhythm: Normal rate and regular rhythm.     Heart sounds: No murmur heard.    Comments: Rate ranges from 98-125 while I am evaluate the patient but is sinus the whole time without any obvious PVCs or PACs. Pulmonary:     Effort: No respiratory distress.  Breath sounds: No stridor.  Abdominal:     General: There is no distension.  Musculoskeletal:     Cervical back: Normal range of motion.  Neurological:     Mental Status: She is alert.     ED Results / Procedures / Treatments   Labs (all labs ordered are listed, but only abnormal results are displayed) Labs Reviewed  BASIC METABOLIC PANEL - Abnormal; Notable for the following components:      Result Value   Potassium 3.4 (*)    Glucose, Bld 103 (*)    All other components within normal limits  CBC - Abnormal; Notable for the following components:   WBC 11.3 (*)    All other components within normal limits  PREGNANCY, URINE  MAGNESIUM  TSH   T4, FREE  TROPONIN I (HIGH SENSITIVITY)    EKG EKG Interpretation Date/Time:  Wednesday February 02 2023 00:21:13 EDT Ventricular Rate:  117 PR Interval:  170 QRS Duration:  65 QT Interval:  291 QTC Calculation: 406 R Axis:   55  Text Interpretation: Sinus tachycardia Low voltage, precordial leads Abnormal Q suggests anterior infarct Borderline T abnormalities, diffuse leads Confirmed by Marily Memos [phone]) on 02/02/2023 1:08:54 AM  Radiology DG Chest 2 View  Result Date: 02/02/2023 CLINICAL DATA:  Palpitations. EXAM: CHEST - 2 VIEW COMPARISON:  October 27, 2022 FINDINGS: The heart size and mediastinal contours are within normal limits. Both lungs are clear. The visualized skeletal structures are unremarkable. IMPRESSION: No active cardiopulmonary disease. Electronically Signed   By: Aram Candela M.D.   On: 02/02/2023 01:03    Procedures Procedures    Medications Ordered in ED Medications  lactated ringers bolus 1,000 mL (0 mLs Intravenous Stopped 02/02/23 0254)    ED Course/ Medical Decision Making/ A&P                             Medical Decision Making Amount and/or Complexity of Data Reviewed Labs: ordered. Radiology: ordered.   Will screen for any significant abnormalities such as electrolytes, dehydration, hyperthyroidism.  Fluids for presumed dehydration/hypovolemia.  She is afebrile I doubt that she has bacteremia or any other causes for her to feel hot and sweaty.  I wonder if she is having anxiety/panic attacks?  HR improved. Symptoms improved. Labs overall reassuring (aside from thyroid studies that have not resulted yet, she will follow on her mychart). Feels much better. Discussed the fact that the cause is still unknown so she will follow up with her PCP.   Final Clinical Impression(s) / ED Diagnoses Final diagnoses:  Heart palpitations  Sinus tachycardia    Rx / DC Orders ED Discharge Orders     None         Jru Pense, Barbara Cower, MD 02/02/23  [phone]

## 2023-03-05 IMAGING — CR DG CHEST 2V
2 series · 2 of 2 positions shown · non-contrast
Comparison: 07/28/2020

CLINICAL DATA: Chest pain for several weeks, initial encounter

EXAM:
CHEST - 2 VIEW

[w chest pa]
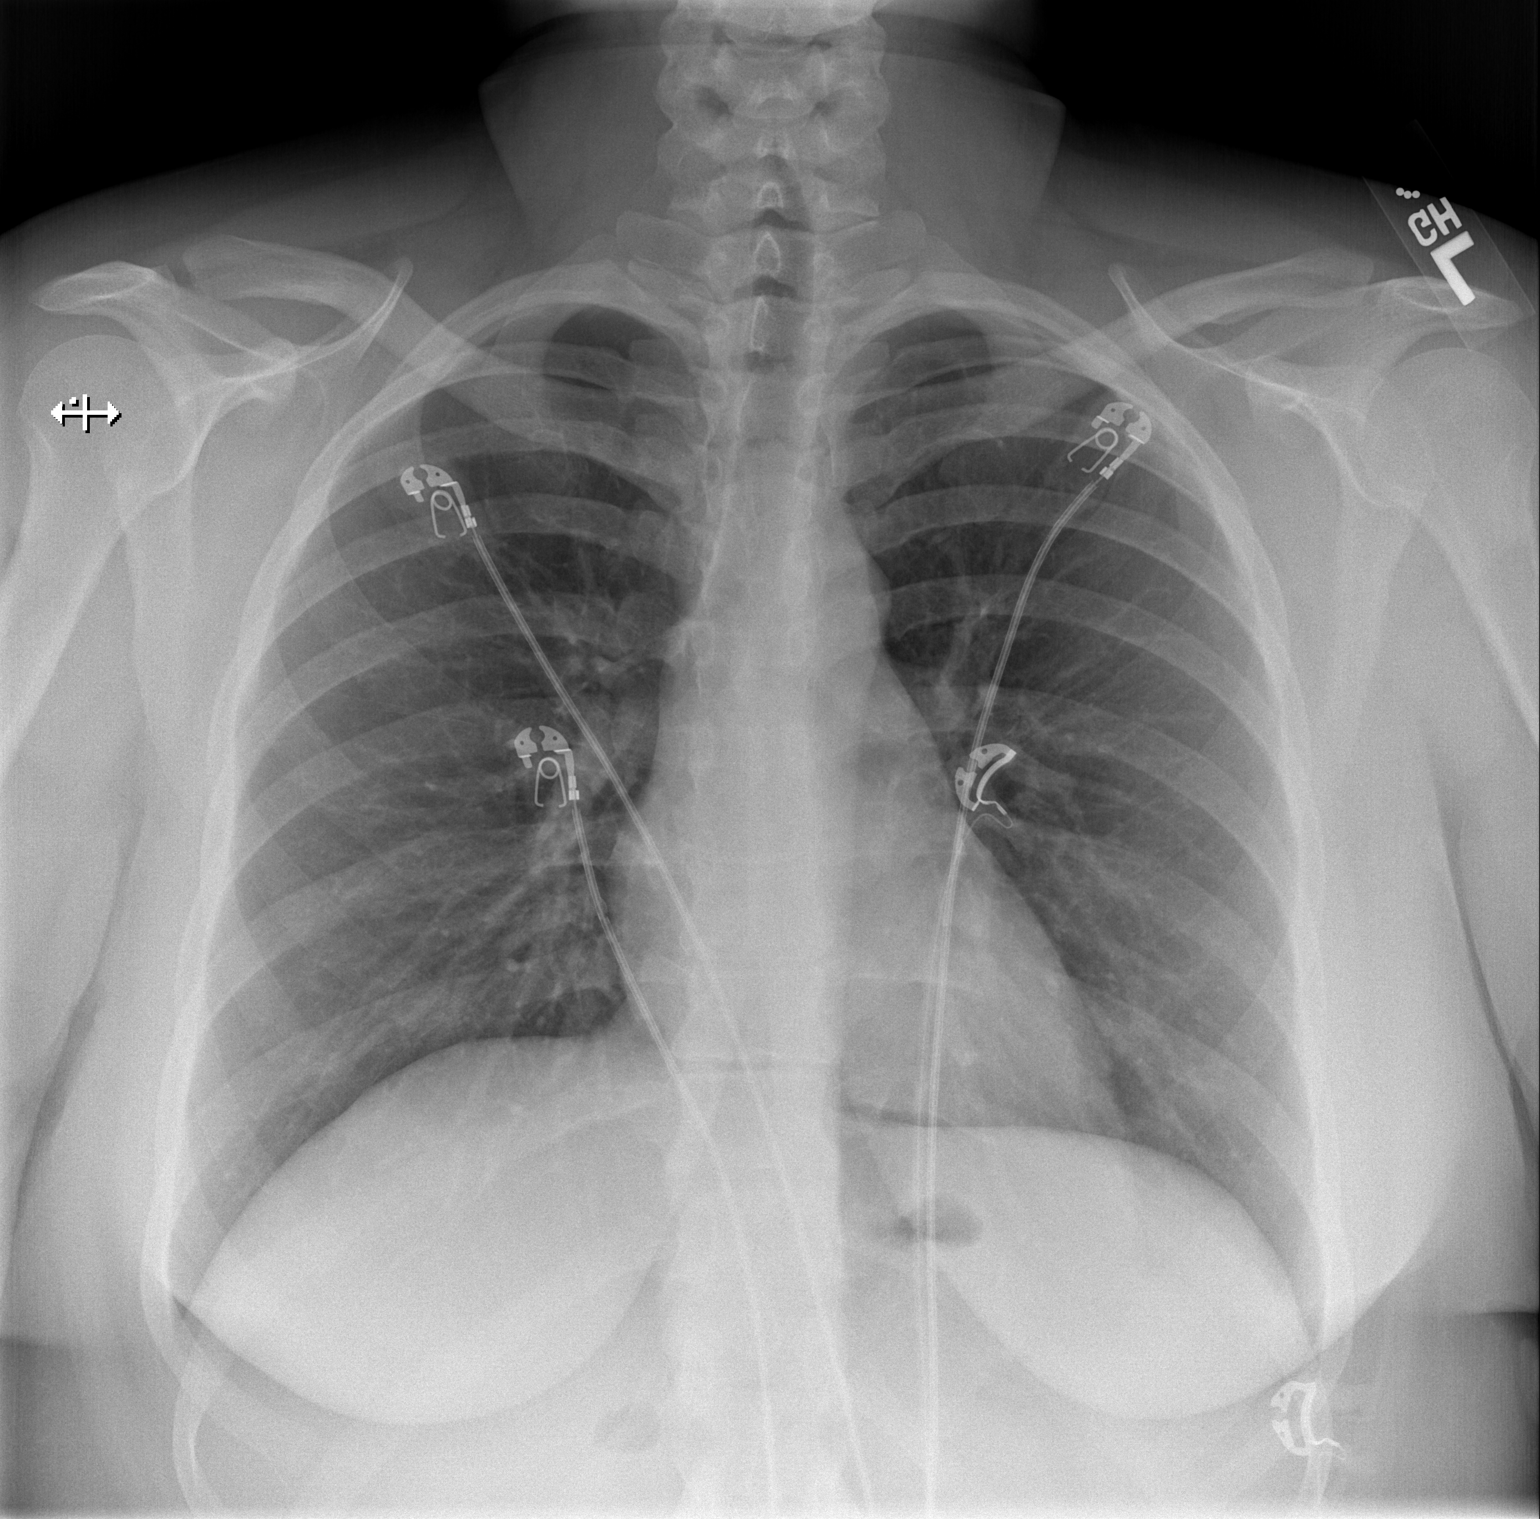

[w chest lat]
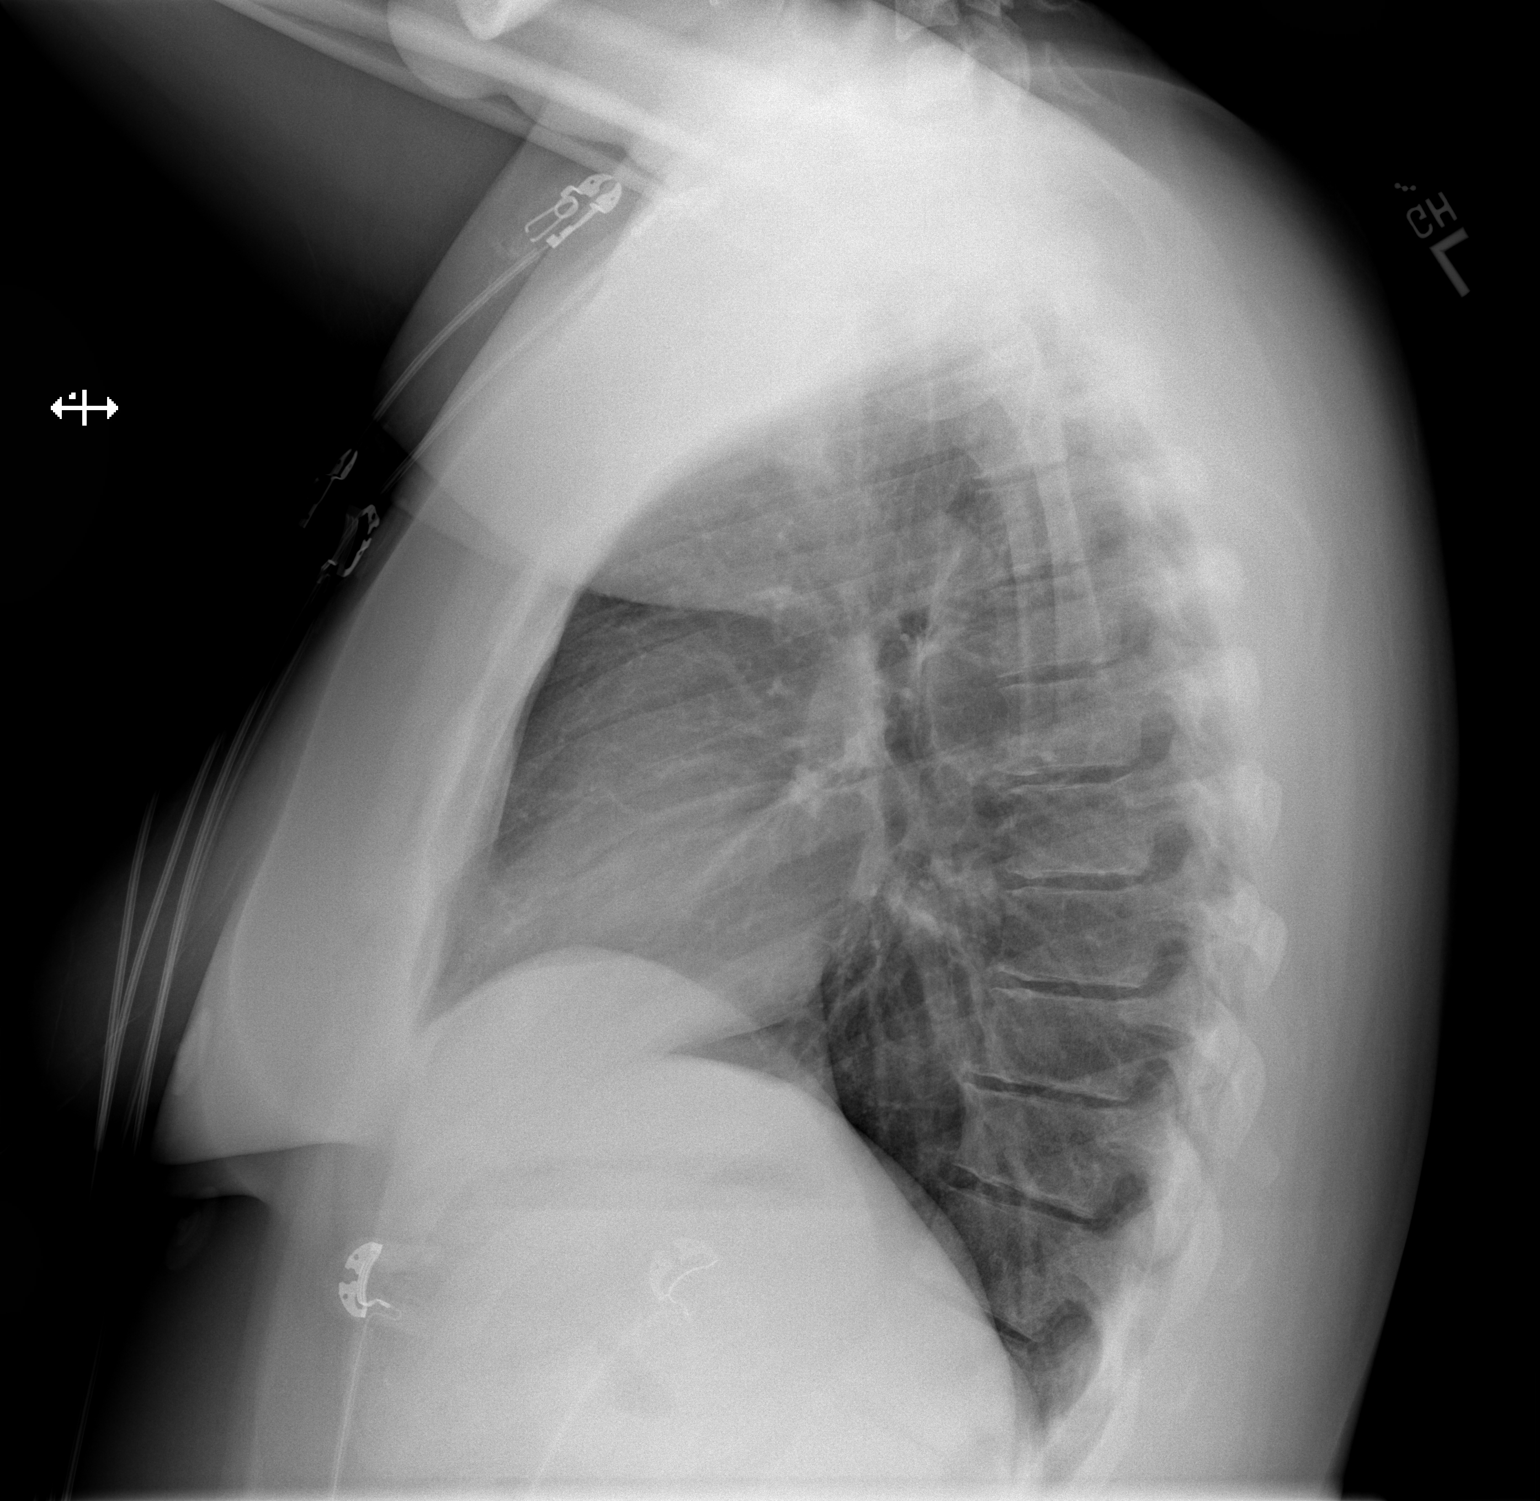

[2 of 2 positions shown; findings below may reference images not displayed]

FINDINGS: The heart size and mediastinal contours are within normal limits.
Both lungs are clear. The visualized skeletal structures are
unremarkable.
IMPRESSION: No active cardiopulmonary disease.

## 2023-03-31 ENCOUNTER — Ambulatory Visit: Payer: BC Managed Care – PPO | Admitting: Nurse Practitioner

## 2023-03-31 VITALS — BP 122/76 | HR 86 | Ht 63.0 in | Wt 193.0 lb

## 2023-03-31 DIAGNOSIS — N898 Other specified noninflammatory disorders of vagina: Secondary | ICD-10-CM | POA: Diagnosis not present

## 2023-03-31 DIAGNOSIS — R1031 Right lower quadrant pain: Secondary | ICD-10-CM

## 2023-03-31 DIAGNOSIS — Z113 Encounter for screening for infections with a predominantly sexual mode of transmission: Secondary | ICD-10-CM

## 2023-03-31 DIAGNOSIS — N920 Excessive and frequent menstruation with regular cycle: Secondary | ICD-10-CM | POA: Diagnosis not present

## 2023-03-31 NOTE — Progress Notes (Signed)
   Acute Office Visit  Subjective:    Patient ID: Stefanie Lucero, female    DOB: Jun 04, 1991, 32 y.o.   MRN: [id number]   HPI 32 y.o. [id number] presents today for lower abdominal pain. Pain started end of July, in RLQ but does radiate across lower abdomen, described as crampy, intermittent. Denies vaginal itching, discharge or odor. Denies urinary or GI symptoms. Presented to ER 03/27/23 for same symptoms, negative workup - [place] and CT unremarkable, negative serum hCG, negative UA. Saw GI yesterday. Colonoscopy scheduled end of this month. LMP 02/24/23. Cycles are regular but have been heavier. Did Nuvaring for a couple of months but felt it affected her moods. Started Zoloft in May.   Patient's last menstrual period was 03/05/2023.    Review of Systems  Constitutional: Negative.   Gastrointestinal:  Positive for abdominal pain and nausea. Negative for blood in stool, constipation, diarrhea and vomiting.  Genitourinary:  Positive for menstrual problem. Negative for difficulty urinating, dysuria, flank pain, frequency, hematuria, urgency, vaginal bleeding, vaginal discharge and vaginal pain.       Objective:    Physical Exam Constitutional:      Appearance: Normal appearance.  Abdominal:     Tenderness: There is abdominal tenderness in the right lower quadrant. There is no guarding or rebound.  Genitourinary:    General: Normal vulva.     Vagina: Vaginal discharge present. No erythema.     Cervix: Normal.     Uterus: Normal.      Adnexa: Right adnexa normal and left adnexa normal.     BP 122/76   Pulse 86   Ht 5\' 3"  (1.6 m)   Wt 193 lb (87.5 kg)   LMP 03/05/2023   SpO2 100%   BMI 34.19 kg/m  Wt Readings from Last 3 Encounters:  03/31/23 193 lb (87.5 kg)  02/02/23 190 lb (86.2 kg)  01/11/23 185 lb (83.9 kg)        Patient informed chaperone available to be present for breast and/or pelvic exam. Patient has requested no chaperone to be present. Patient has been advised what  will be completed during breast and pelvic exam.   Assessment & Plan:   Problem List Items Addressed This Visit   None Visit Diagnoses     Right lower quadrant abdominal pain    -  Primary   Screening examination for STD (sexually transmitted disease)       Relevant Orders   SureSwab Advanced Vaginitis Plus,TMA   Vaginal discharge       Relevant Orders   SureSwab Advanced Vaginitis Plus,TMA   Menorrhagia with regular cycle          Plan: Reviewed normal CT and [place]. Slight tenderness on exam today in RLQ, otherwise normal exam. Requesting STD screening. Discussed option to try progestin-only birth control if normal colonoscopy/GI follow up and she is agreeable.      Olivia Mackie DNP, 9:18 AM 03/31/2023

## 2023-04-01 LAB — SURESWAB® ADVANCED VAGINITIS PLUS,TMA
C. trachomatis RNA, TMA: NOT DETECTED
CANDIDA SPECIES: NOT DETECTED
Candida glabrata: NOT DETECTED
N. gonorrhoeae RNA, TMA: NOT DETECTED
SURESWAB(R) ADV BACTERIAL VAGINOSIS(BV),TMA: NEGATIVE
TRICHOMONAS VAGINALIS (TV),TMA: NOT DETECTED

## 2023-05-04 ENCOUNTER — Encounter: Payer: Self-pay | Admitting: Nurse Practitioner

## 2023-05-07 IMAGING — CR DG CHEST 2V
2 series · 2 of 2 positions shown · non-contrast
Comparison: 12/22/2020

CLINICAL DATA: Left upper lateral chest pain radiating to left
shoulder and neck, pain with inspiration

EXAM:
CHEST - 2 VIEW

[w chest pa]
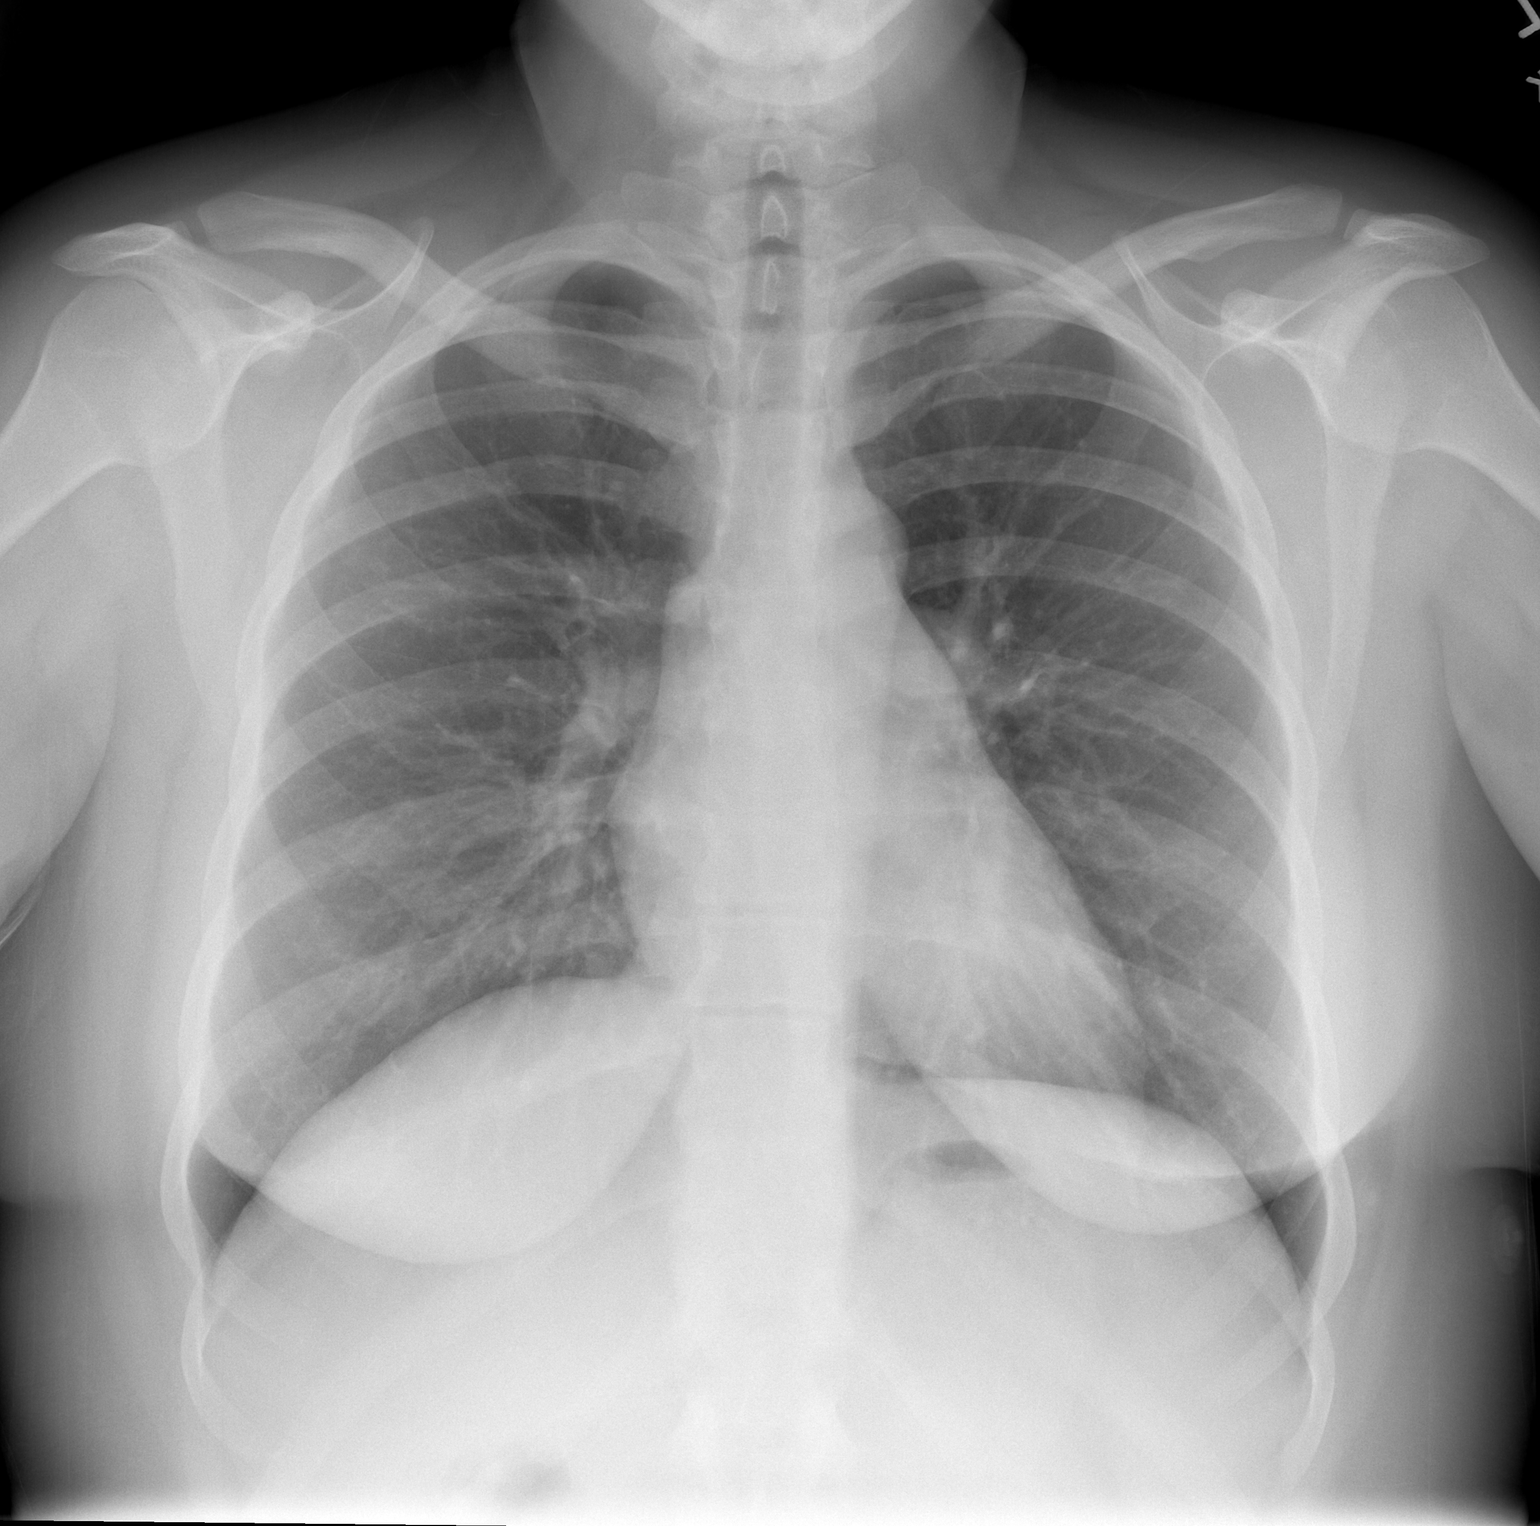

[w chest lat]
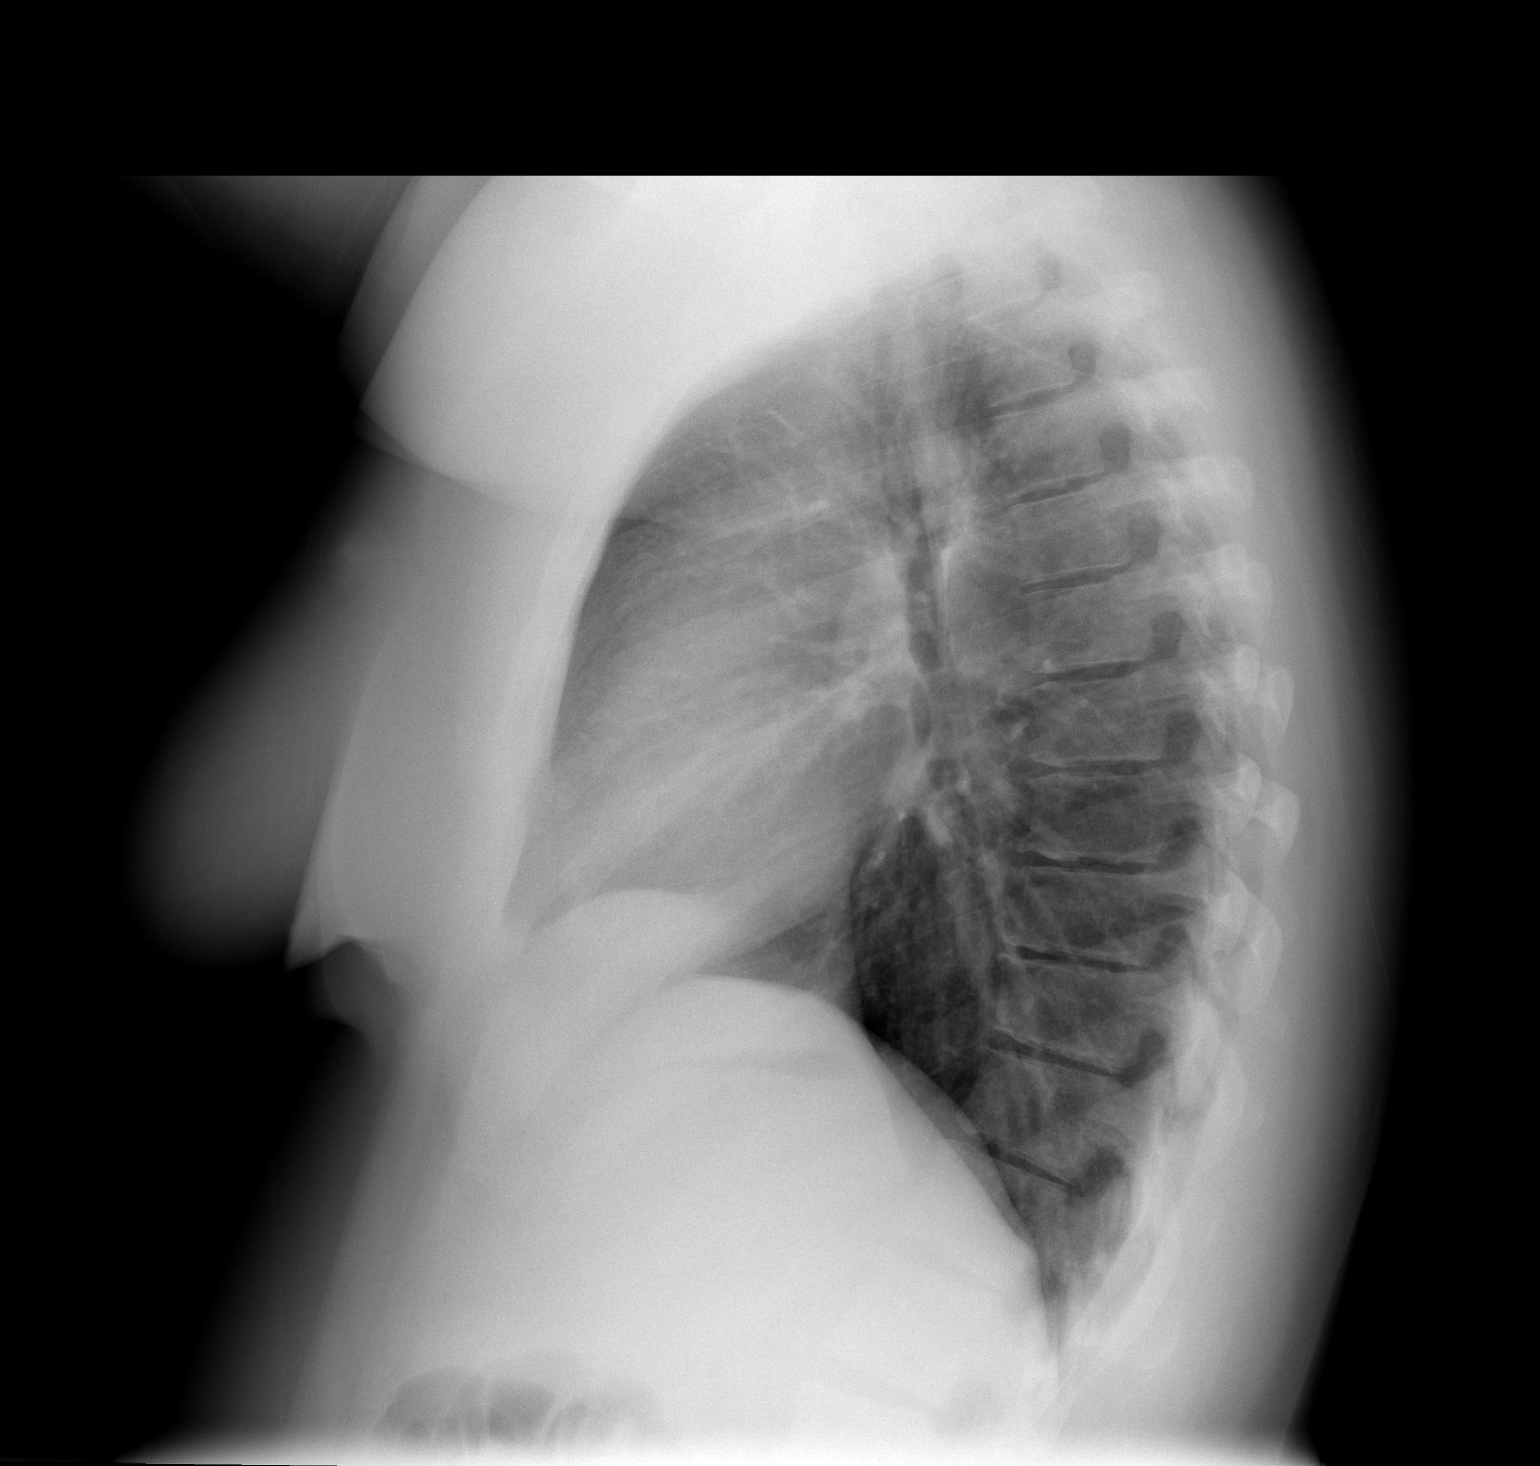

[2 of 2 positions shown; findings below may reference images not displayed]

FINDINGS: Frontal and lateral views of the chest demonstrate an unremarkable
cardiac silhouette. No airspace disease, effusion, or pneumothorax.
No acute bony abnormalities.
IMPRESSION: 1. No acute intrathoracic process.

## 2023-11-26 IMAGING — CT CT RENAL STONE PROTOCOL
2 of 3 series · 17 of 46 positions shown, 19 images · non-contrast
Comparison: 07/01/2021

CLINICAL DATA: Flank pain



[Series 2: axial st · axial · 0.95mm/px · z∈[+782,+1198]mm · 14 of 97 slices shown, 16 images]
[im 7/97  soft-tissue]
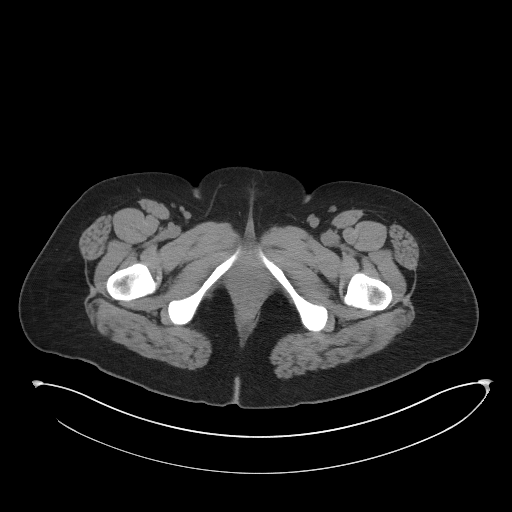
[im 7/97  bone]
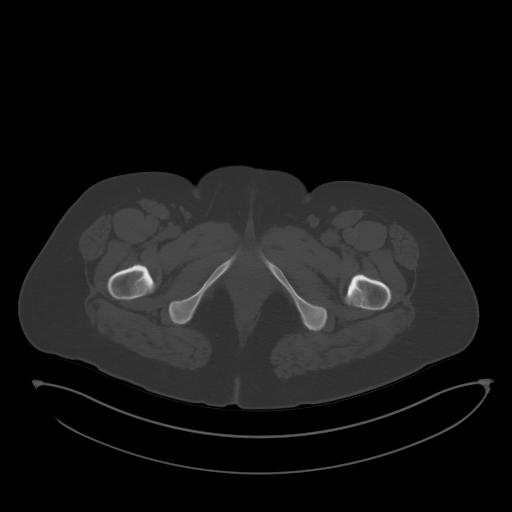
[im 13/97  soft-tissue]
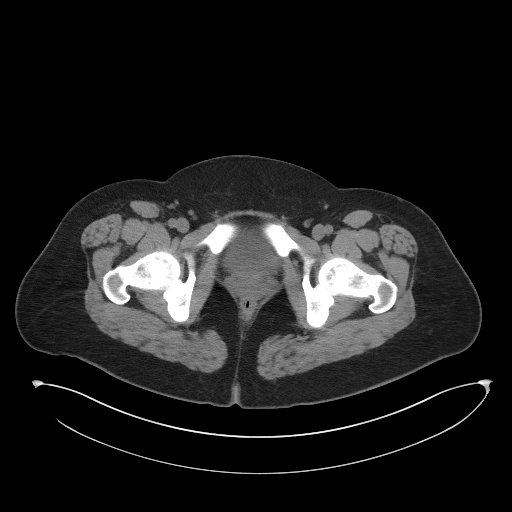
[im 19/97  soft-tissue]
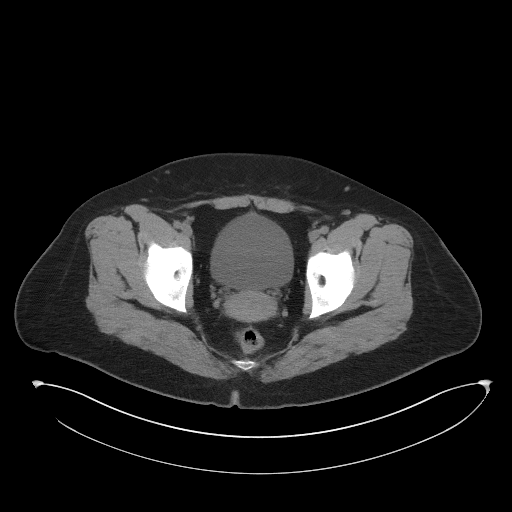
[im 25/97  soft-tissue]
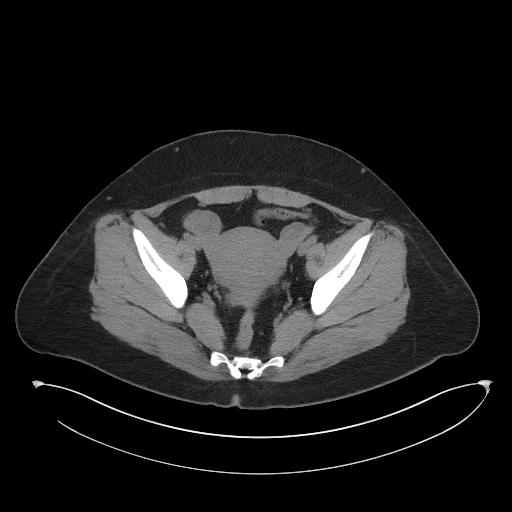
[im 31/97  soft-tissue]
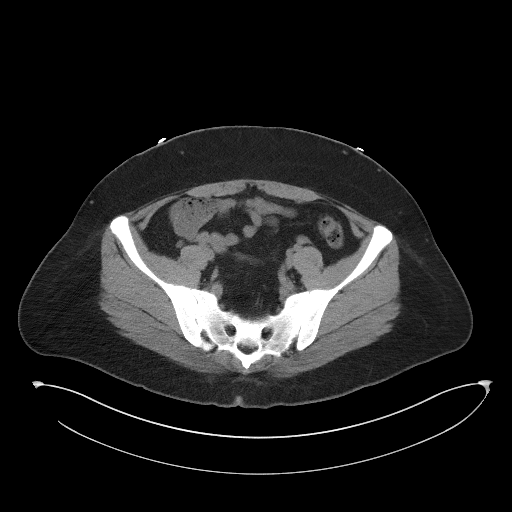
[im 38/97  soft-tissue]
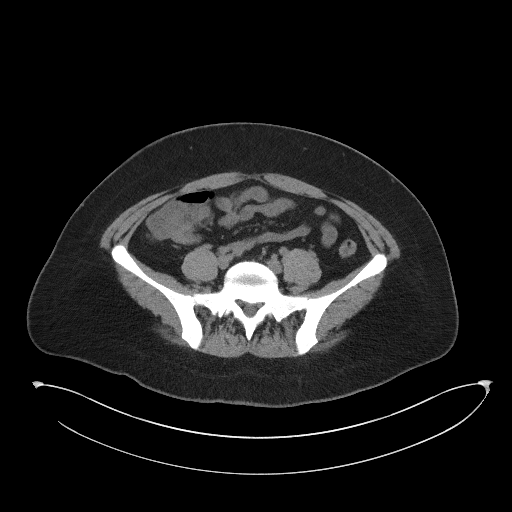
[im 44/97  soft-tissue]
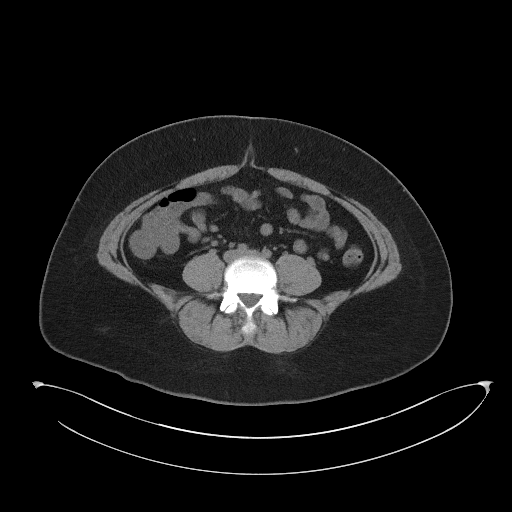
[im 53/97  soft-tissue]
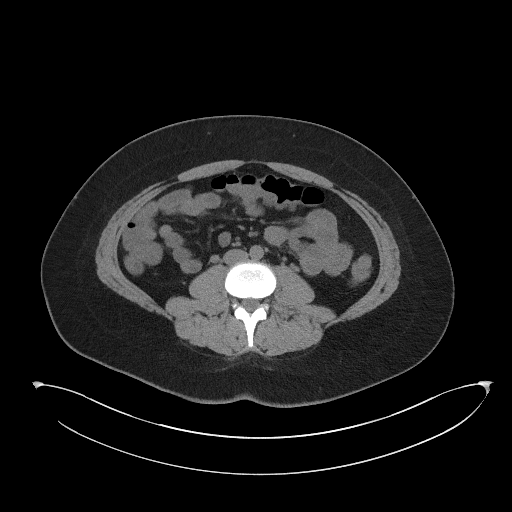
[im 59/97  soft-tissue]
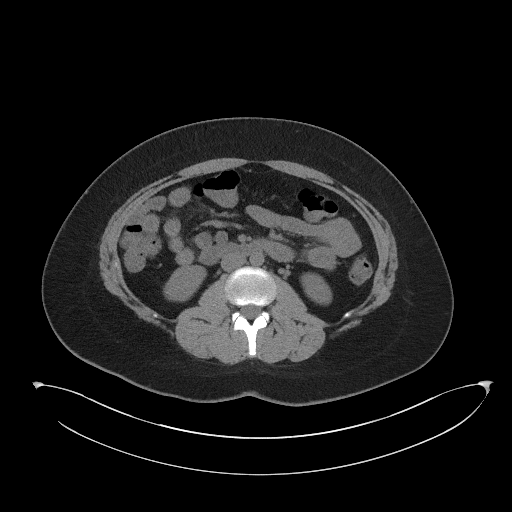
[im 59/97  bone]
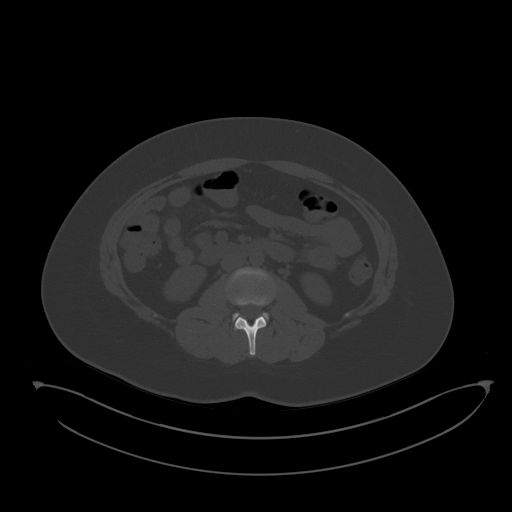
[im 66/97  soft-tissue]
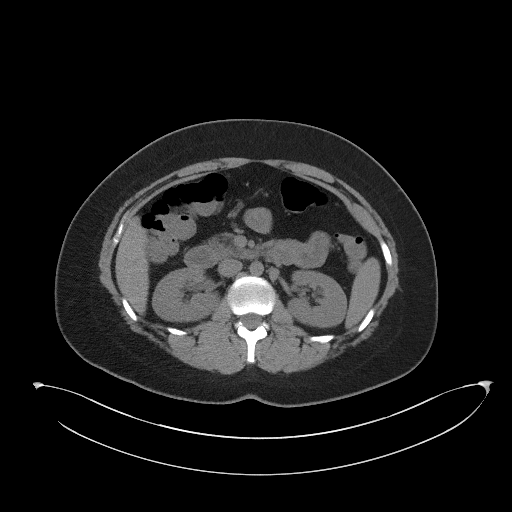
[im 72/97  soft-tissue]
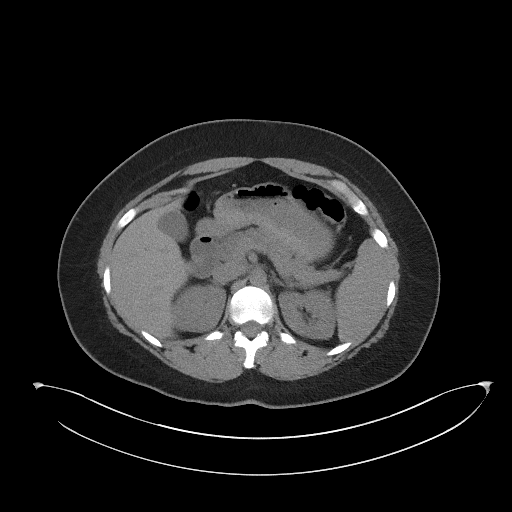
[im 78/97  soft-tissue]
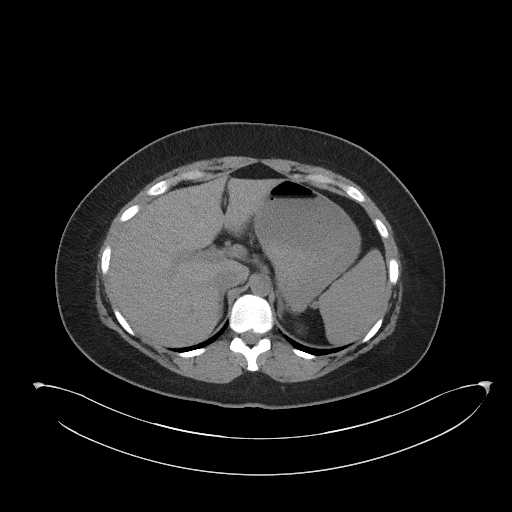
[im 84/97  soft-tissue]
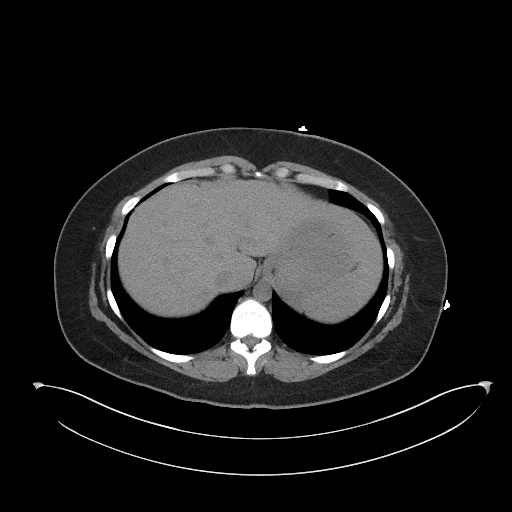
[im 90/97  soft-tissue]
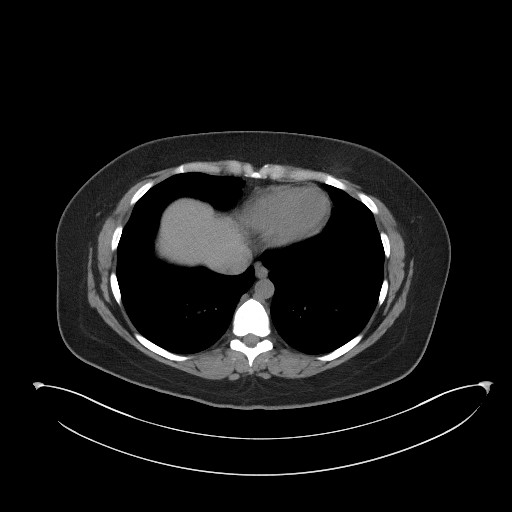

[Series 4: coronal st · coronal · 0.93mm/px · 3 of 102 slices shown]
[im 34/102  soft-tissue]
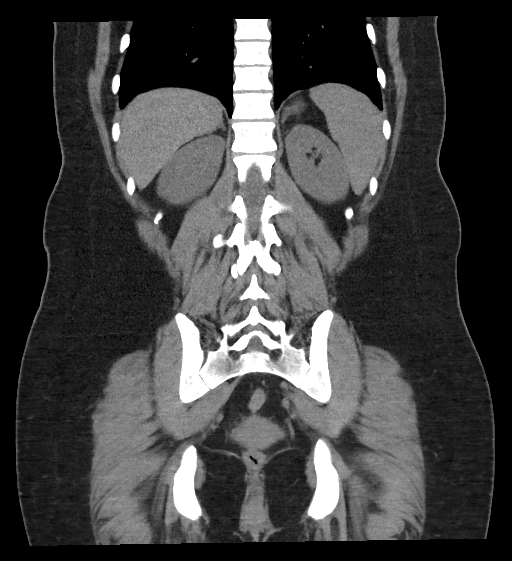
[im 45/102  soft-tissue]
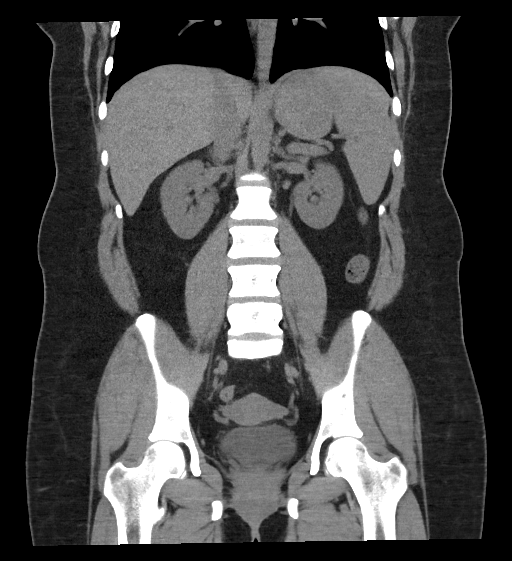
[im 57/102  soft-tissue]
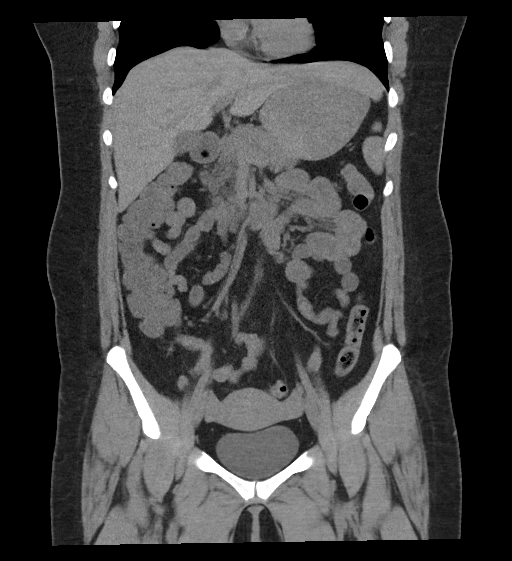

[17 of 46 positions shown; findings below may reference images not displayed]

FINDINGS: Lower chest: No acute abnormality.

Hepatobiliary: No focal liver abnormality is seen. No gallstones,
gallbladder wall thickening, or biliary dilatation.

Pancreas: Unremarkable. No pancreatic ductal dilatation or
surrounding inflammatory changes.

Spleen: Normal in size without focal abnormality.

Adrenals/Urinary Tract: Adrenal glands are within normal limits. The
kidneys show no renal calculi or obstructive changes. Arteries are
within normal limits. The bladder is well distended.

Stomach/Bowel: Appendix is within normal limits. No obstructive or
inflammatory changes of the colon are seen. Small bowel and stomach
are unremarkable.

Vascular/Lymphatic: No significant vascular findings are present. No
enlarged abdominal or pelvic lymph nodes.

Reproductive: Uterus and bilateral adnexa are unremarkable.

Other: No abdominal wall hernia or abnormality. No abdominopelvic
ascites.

Musculoskeletal: No acute or significant osseous findings.
IMPRESSION: No acute abnormality noted to correspond with the patient's given
clinical symptomatology.

## 2023-12-09 ENCOUNTER — Ambulatory Visit: Payer: BC Managed Care – PPO | Admitting: Nurse Practitioner

## 2023-12-09 NOTE — Progress Notes (Deleted)
   Stefanie Lucero Mar 27, 1991 [id number]   History:  33 y.o. G2P2002 presents for annual exam. Monthly cycles. Heavy menses. Had IUD, tried patches. Normal pap history. Migraines managed by neurology. Occur rarely now since changes diet, no aura and more tension-type. GERD managed by PCP.  Gynecologic History No LMP recorded.   Contraception/Family planning: none Sexually active: Yes  Health Maintenance Last Pap: 10/01/2021. Results were: Normal Last mammogram: Not indicated Last colonoscopy: 03/2023? Last Dexa: Not indicated   Past medical history, past surgical history, family history and social history were all reviewed and documented in the EPIC chart. Engaged. 32 yo son and 32 yo daughter.   ROS:  A ROS was performed and pertinent positives and negatives are included.  Exam:  There were no vitals filed for this visit.  There is no height or weight on file to calculate BMI.  General appearance:  Normal Thyroid :  Symmetrical, normal in size, without palpable masses or nodularity. Respiratory  Auscultation:  Clear without wheezing or rhonchi Cardiovascular  Auscultation:  Regular rate, without rubs, murmurs or gallops  Edema/varicosities:  Not grossly evident Abdominal  Soft,nontender, without masses, guarding or rebound.  Liver/spleen:  No organomegaly noted  Hernia:  None appreciated  Skin  Inspection:  Grossly normal Breasts: Examined lying and sitting.   Right: Without masses, retractions, nipple discharge or axillary adenopathy.   Left: Without masses, retractions, nipple discharge or axillary adenopathy. Pelvic: External genitalia:  no lesions              Urethra:  normal appearing urethra with no masses, tenderness or lesions              Bartholins and Skenes: normal                 Vagina: normal appearing vagina with normal color and discharge, no lesions              Cervix: no lesions Bimanual Exam:  Uterus:  no masses or tenderness              Adnexa: no  mass, fullness, tenderness              Rectovaginal: Deferred              Anus:  normal, no lesions  Patient informed chaperone available to be present for breast and pelvic exam. Patient has requested no chaperone to be present. Patient has been advised what will be completed during breast and pelvic exam.   Assessment/Plan:  33 y.o. [id number] for annual exam.   Well female exam with routine gynecological exam - Education provided on SBEs, importance of preventative screenings, current guidelines, high calcium diet, regular exercise, and multivitamin daily.  Labs with PCP.   Screening for cervical cancer - Normal Pap history. Will repeat at 3-year interval per guidelines.    No follow-ups on file.    Stefanie Bamberger DNP, 7:41 AM 12/09/2023

## 2024-03-01 NOTE — Progress Notes (Deleted)
   Stefanie Lucero [birth year] [id number]   History:  33 y.o. G2P2002 presents for annual exam. Monthly cycles. H/O heavy periods. Has tried Nuvaring and patch. Normal pap history. Migraines managed by neurology.  GERD managed by PCP.  Gynecologic History No LMP recorded.   Contraception/Family planning: none Sexually active: Yes  Health Maintenance Last Pap: 10/01/2021. Results were: Normal Last mammogram: Not indicated Last colonoscopy: Not indicated Last Dexa: Not indicated   Past medical history, past surgical history, family history and social history were all reviewed and documented in the EPIC chart. Engaged. 32 yo son and 32 yo daughter.   ROS:  A ROS was performed and pertinent positives and negatives are included.  Exam:  There were no vitals filed for this visit.  There is no height or weight on file to calculate BMI.  General appearance:  Normal Thyroid :  Symmetrical, normal in size, without palpable masses or nodularity. Respiratory  Auscultation:  Clear without wheezing or rhonchi Cardiovascular  Auscultation:  Regular rate, without rubs, murmurs or gallops  Edema/varicosities:  Not grossly evident Abdominal  Soft,nontender, without masses, guarding or rebound.  Liver/spleen:  No organomegaly noted  Hernia:  None appreciated  Skin  Inspection:  Grossly normal Breasts: Examined lying and sitting.   Right: Without masses, retractions, nipple discharge or axillary adenopathy.   Left: Without masses, retractions, nipple discharge or axillary adenopathy. Pelvic: External genitalia:  no lesions              Urethra:  normal appearing urethra with no masses, tenderness or lesions              Bartholins and Skenes: normal                 Vagina: normal appearing vagina with normal color and discharge, no lesions              Cervix: no lesions Bimanual Exam:  Uterus:  no masses or tenderness              Adnexa: no mass, fullness, tenderness               Rectovaginal: Deferred              Anus:  normal, no lesions   Assessment/Plan:  33 y.o. [id number] for annual exam.   Well female exam with routine gynecological exam - Education provided on SBEs, importance of preventative screenings, current guidelines, high calcium diet, regular exercise, and multivitamin daily.  Labs with PCP.   Screening for cervical cancer - Normal Pap history. Will repeat at 3-year interval per guidelines.   No follow-ups on file.    Stefanie DELENA Shutter DNP, 4:00 PM 03/01/2024

## 2024-03-02 ENCOUNTER — Ambulatory Visit: Admitting: Nurse Practitioner

## 2024-03-02 DIAGNOSIS — Z1331 Encounter for screening for depression: Secondary | ICD-10-CM

## 2024-03-02 DIAGNOSIS — Z01419 Encounter for gynecological examination (general) (routine) without abnormal findings: Secondary | ICD-10-CM

## 2024-06-07 ENCOUNTER — Other Ambulatory Visit: Payer: Self-pay

## 2024-06-07 ENCOUNTER — Emergency Department (HOSPITAL_BASED_OUTPATIENT_CLINIC_OR_DEPARTMENT_OTHER): Admission: EM | Admit: 2024-06-07 | Discharge: 2024-06-07 | Disposition: A | Discharge status: Home / Self Care

## 2024-06-07 ENCOUNTER — Encounter (HOSPITAL_BASED_OUTPATIENT_CLINIC_OR_DEPARTMENT_OTHER): Payer: Self-pay

## 2024-06-07 DIAGNOSIS — Z91040 Latex allergy status: Secondary | ICD-10-CM | POA: Insufficient documentation

## 2024-06-07 DIAGNOSIS — E119 Type 2 diabetes mellitus without complications: Secondary | ICD-10-CM | POA: Insufficient documentation

## 2024-06-07 DIAGNOSIS — M79661 Pain in right lower leg: Secondary | ICD-10-CM | POA: Diagnosis present

## 2024-06-07 NOTE — ED Provider Notes (Signed)
 [facility] Provider Note   CSN: [id number] Arrival date & time: 06/07/24  9055     Patient presents with: Leg Pain   Stefanie Lucero is a 33 y.o. female no past medical history seen for diabetes, obesity who presents with concern for right calf pain after stepping down her stairs and hearing a pop.  She reports that is quite painful, it is difficult to bear weight on the leg without severe pain.  She reports that it was slightly tight before the injury but the pain started very suddenly after taking the step.  Denies any recent travel, surgery, history of blood clots.  Nothing for pain prior to arrival.  She can bear weight with some difficulty.    Leg Pain      Prior to Admission medications   Medication Sig Start Date End Date Taking? Authorizing Provider  albuterol  (VENTOLIN  HFA) 108 (90 Base) MCG/ACT inhaler Inhale 2 puffs into the lungs every 4 (four) hours as needed for wheezing or shortness of breath. 11/06/20   [provider]  dicyclomine  (BENTYL ) 20 MG tablet Take 1 tablet (20 mg total) by mouth every 6 (six) hours as needed (for abdominal cramping). 01/11/23   Molpus, John, MD  sertraline (ZOLOFT) 100 MG tablet Take 100 mg by mouth daily.    [provider]    Allergies: Egg protein-containing drug products, Iodinated contrast media, Iodine, Latex, Nutmeg oil (myristica oil), Protonix  [pantoprazole ], Cefdinir, Cefuroxime, Guggulipid-black pepper, Nickel, and Other    Review of Systems  All other systems reviewed and are negative.   Updated Vital Signs BP 133/86   Pulse 84   Temp 98 F (36.7 C)   Resp 16   Wt 86.2 kg   LMP 06/05/2024   SpO2 97%   BMI 33.66 kg/m   Physical Exam Vitals and nursing note reviewed.  Constitutional:      General: She is not in acute distress.    Appearance: Normal appearance.  HENT:     Head: Normocephalic and atraumatic.  Eyes:     General:        Right eye: No  discharge.        Left eye: No discharge.  Cardiovascular:     Rate and Rhythm: Normal rate and regular rhythm.     Pulses: Normal pulses.     Comments: Dp, pt pulses 2+ in the affected RLE Pulmonary:     Effort: Pulmonary effort is normal. No respiratory distress.  Musculoskeletal:        General: No deformity.     Comments: Focal tenderness to palpation of right calf with no significant focal swelling, no obvious deformity across the gastrocnemius or soleus muscle.  Intact plantarflexion, dorsiflexion at level of the ankle with some pain.  No pain in popliteal fossa.  Skin:    General: Skin is warm and dry.  Neurological:     Mental Status: She is alert and oriented to person, place, and time.  Psychiatric:        Mood and Affect: Mood normal.        Behavior: Behavior normal.     (all labs ordered are listed, but only abnormal results are displayed) Labs Reviewed - No data to display  EKG: None  Radiology: No results found.   Procedures   Medications Ordered in the ED - No data to display  Medical Decision Making  This patient is a 33 y.o. female who presents to the ED for concern of calf pain.   Differential diagnoses prior to evaluation: DVT, peripheral edema, Achilles tendon rupture, calf muscle strain, calf muscle rupture, cellulitis, abscess, PAD, PVD, versus other musculoskeletal injury   Past Medical History / Social History / Additional history: Chart reviewed. Pertinent results include: Anxiety, obesity  Physical Exam: Physical exam performed. The pertinent findings include: Focal tenderness to palpation of right calf with no significant focal swelling, no obvious deformity across the gastrocnemius or soleus muscle.  Intact plantarflexion, dorsiflexion at level of the ankle with some pain.  No pain in popliteal fossa.   Dp, pt pulses 2+ in the affected RLE  Medications / Treatment: Suspect calf strain without rupture,  encourage ibuprofen, tylenol , RICE, ortho follow up, crutches as needed   Disposition: After consideration of the diagnostic results and the patients response to treatment, I feel that patient is stable for discharge with plan as above .   emergency department workup does not suggest an emergent condition requiring admission or immediate intervention beyond what has been performed at this time. The plan is: as above. The patient is safe for discharge and has been instructed to return immediately for worsening symptoms, change in symptoms or any other concerns.   Final diagnoses:  Right calf pain    ED Discharge Orders     None          Rosan Sherlean DEL, PA-C 06/07/24 [address] DO 06/08/24 [phone]

## 2024-06-07 NOTE — Discharge Instructions (Addendum)
 Please use Tylenol  or ibuprofen for pain.  You may use 600 mg ibuprofen every 6 hours or 1000 mg of Tylenol  every 6 hours.  You may choose to alternate between the 2.  This would be most effective.  Not to exceed 4 g of Tylenol  within 24 hours.  Not to exceed 3200 mg ibuprofen 24 hours.  I recommend rest, ice, compression, elevation of the affected extremity.  Contact the orthopedic physician whose information I provided above to discuss additional treatment of your pain if your pain persists greater than 1 week.  Please return if you have worsening swelling, redness.  Utilize crutches for comfort, prior to weight-bear as tolerated.

## 2024-06-07 NOTE — ED Triage Notes (Signed)
 Pt reports stepping off one step and heard pop sound in right calf. Very painful Unable to bear weight on right leg without severe pain

## 2024-06-07 NOTE — ED Notes (Signed)
 ED Provider at bedside.

## 2024-09-17 ENCOUNTER — Ambulatory Visit: Admitting: Nurse Practitioner
